# Patient Record
Sex: Female | Born: 1965
Health system: Southern US, Community
[De-identification: ages and names within clinical notes are randomized; demographics above are authoritative.]

## PROBLEM LIST (undated history)

## (undated) VITALS — BP 123/81 | HR 82 | Temp 98.4°F | Resp 16 | Ht 69.0 in | Wt 185.0 lb

## (undated) DIAGNOSIS — Z8042 Family history of malignant neoplasm of prostate: Secondary | ICD-10-CM

## (undated) DIAGNOSIS — H269 Unspecified cataract: Secondary | ICD-10-CM

## (undated) DIAGNOSIS — Z803 Family history of malignant neoplasm of breast: Secondary | ICD-10-CM

## (undated) DIAGNOSIS — F419 Anxiety disorder, unspecified: Secondary | ICD-10-CM

## (undated) DIAGNOSIS — F329 Major depressive disorder, single episode, unspecified: Secondary | ICD-10-CM

## (undated) DIAGNOSIS — Z808 Family history of malignant neoplasm of other organs or systems: Secondary | ICD-10-CM

## (undated) DIAGNOSIS — K2270 Barrett's esophagus without dysplasia: Secondary | ICD-10-CM

## (undated) DIAGNOSIS — C160 Malignant neoplasm of cardia: Secondary | ICD-10-CM

## (undated) DIAGNOSIS — F32A Depression, unspecified: Secondary | ICD-10-CM

## (undated) DIAGNOSIS — Z800 Family history of malignant neoplasm of digestive organs: Secondary | ICD-10-CM

## (undated) HISTORY — PX: OTHER SURGICAL HISTORY: SHX169

## (undated) HISTORY — DX: Family history of malignant neoplasm of prostate: Z80.42

## (undated) HISTORY — PX: IUD REMOVAL: SHX5392

## (undated) HISTORY — DX: Family history of malignant neoplasm of digestive organs: Z80.0

## (undated) HISTORY — DX: Family history of malignant neoplasm of other organs or systems: Z80.8

## (undated) HISTORY — PX: INTRAUTERINE DEVICE INSERTION: SHX323

## (undated) HISTORY — DX: Family history of malignant neoplasm of breast: Z80.3

## (undated) HISTORY — DX: Unspecified cataract: H26.9

---

## 1999-05-20 ENCOUNTER — Other Ambulatory Visit: Admission: RE | Admit: 1999-05-20 | Discharge: 1999-05-20 | Payer: Self-pay | Admitting: Obstetrics and Gynecology

## 2000-08-11 ENCOUNTER — Other Ambulatory Visit: Admission: RE | Admit: 2000-08-11 | Discharge: 2000-08-11 | Payer: Self-pay | Admitting: *Deleted

## 2001-10-18 ENCOUNTER — Other Ambulatory Visit: Admission: RE | Admit: 2001-10-18 | Discharge: 2001-10-18 | Payer: Self-pay | Admitting: Obstetrics and Gynecology

## 2003-02-14 ENCOUNTER — Other Ambulatory Visit: Admission: RE | Admit: 2003-02-14 | Discharge: 2003-02-14 | Payer: Self-pay | Admitting: Obstetrics and Gynecology

## 2011-07-19 ENCOUNTER — Encounter: Payer: Self-pay | Admitting: *Deleted

## 2011-07-19 ENCOUNTER — Emergency Department (HOSPITAL_COMMUNITY)
Admission: EM | Admit: 2011-07-19 | Discharge: 2011-07-19 | Disposition: A | Discharge status: Home / Self Care | Payer: BC Managed Care – PPO | Source: Home / Self Care | Attending: Family Medicine | Admitting: Family Medicine

## 2011-07-19 DIAGNOSIS — A4902 Methicillin resistant Staphylococcus aureus infection, unspecified site: Secondary | ICD-10-CM

## 2011-07-19 DIAGNOSIS — L0291 Cutaneous abscess, unspecified: Secondary | ICD-10-CM

## 2011-07-19 DIAGNOSIS — B9562 Methicillin resistant Staphylococcus aureus infection as the cause of diseases classified elsewhere: Secondary | ICD-10-CM

## 2011-07-19 HISTORY — DX: Anxiety disorder, unspecified: F41.9

## 2011-07-19 HISTORY — DX: Major depressive disorder, single episode, unspecified: F32.9

## 2011-07-19 HISTORY — DX: Depression, unspecified: F32.A

## 2011-07-19 MED ORDER — DOXYCYCLINE HYCLATE 100 MG PO CAPS: 100.0000 mg | ORAL_CAPSULE | Freq: Two times a day (BID) | ORAL | Status: AC

## 2011-07-19 NOTE — ED Provider Notes (Signed)
History     CSN: [id number] Arrival date & time: 07/19/2011  9:37 AM   First MD Initiated Contact with Patient 07/19/11 1013      Chief Complaint  Patient presents with  . Recurrent Skin Infections    (Consider location/radiation/quality/duration/timing/severity/associated sxs/prior treatment) Patient is a 44 y.o. female presenting with rash. The history is provided by the patient.  Rash  This is a recurrent problem. The current episode started 3 to 5 hours ago. The problem has not changed since onset.The problem is associated with an unknown factor. There has been no fever. The rash is present on the right upper leg. The patient is experiencing no pain. She has tried nothing for the symptoms.    Past Medical History  Diagnosis Date  . Anxiety and depression     Past Surgical History  Procedure Date  . Intrauterine device insertion     Paraguard    Family History  Problem Relation Age of Onset  . Cancer Mother   . Barrett's esophagus Mother   . Hypertension Mother     History  Substance Use Topics  . Smoking status: Current Everyday Smoker -- 1.0 packs/day    Types: Cigarettes  . Smokeless tobacco: Not on file  . Alcohol Use: Yes     occasional    OB History    Grav Para Term Preterm Abortions TAB SAB Ect Mult Living                  Review of Systems  Constitutional: Negative.   Skin: Positive for rash. Negative for wound.    Allergies  Review of patient's allergies indicates no known allergies.  Home Medications   Current Outpatient Rx  Name Route Sig Dispense Refill  . ARIPIPRAZOLE 2 MG PO TABS Oral Take 2 mg by mouth daily.      Marland Kitchen CLONAZEPAM 0.5 MG PO TABS Oral Take 0.5 mg by mouth 4 (four) times daily. Taking 2 tabs in the AM, 1 tablet at Lunch, 1 tablet at [employer], 2 tablets before bedtime     . ZOLOFT PO Oral Take 200 mg by mouth daily.        BP 122/84  Pulse 70  Temp(Src) 98.2 F (36.8 C) (Oral)  Resp 18  SpO2 99%  LMP  07/05/2011  Physical Exam  Nursing note and vitals reviewed. Constitutional: She appears well-developed and well-nourished.  Skin: Skin is warm and dry. There is erythema.       Tender nonfluctuant, nondraining erythematous lesion on right medial thigh    ED Course  Procedures (including critical care time)  Labs Reviewed - No data to display No results found.   No diagnosis found.    MDM          Barkley Bruns, MD 07/19/11 1046

## 2011-07-19 NOTE — ED Notes (Signed)
Pt presents with reddened raised area to posterior right thigh that is warm to touch. She has noted raised areas to be recurrent over the past 2-3 months. Denies fever. No open area on wound at this time.

## 2012-02-09 ENCOUNTER — Encounter (HOSPITAL_COMMUNITY): Payer: Self-pay

## 2012-02-09 ENCOUNTER — Inpatient Hospital Stay (HOSPITAL_COMMUNITY)
Admission: RE | Admit: 2012-02-09 | Discharge: 2012-02-13 | DRG: 430 | Disposition: A | Discharge status: Home / Self Care | Payer: BC Managed Care – PPO | Attending: Psychiatry | Admitting: Psychiatry

## 2012-02-09 DIAGNOSIS — IMO0002 Reserved for concepts with insufficient information to code with codable children: Secondary | ICD-10-CM

## 2012-02-09 DIAGNOSIS — T424X5A Adverse effect of benzodiazepines, initial encounter: Secondary | ICD-10-CM | POA: Diagnosis present

## 2012-02-09 DIAGNOSIS — Z23 Encounter for immunization: Secondary | ICD-10-CM

## 2012-02-09 DIAGNOSIS — F17200 Nicotine dependence, unspecified, uncomplicated: Secondary | ICD-10-CM | POA: Diagnosis present

## 2012-02-09 DIAGNOSIS — R45851 Suicidal ideations: Secondary | ICD-10-CM

## 2012-02-09 DIAGNOSIS — F4001 Agoraphobia with panic disorder: Secondary | ICD-10-CM | POA: Diagnosis present

## 2012-02-09 DIAGNOSIS — T424X5S Adverse effect of benzodiazepines, sequela: Secondary | ICD-10-CM | POA: Diagnosis present

## 2012-02-09 DIAGNOSIS — F1210 Cannabis abuse, uncomplicated: Secondary | ICD-10-CM | POA: Diagnosis present

## 2012-02-09 DIAGNOSIS — F3162 Bipolar disorder, current episode mixed, moderate: Principal | ICD-10-CM | POA: Diagnosis present

## 2012-02-09 HISTORY — DX: Anxiety disorder, unspecified: F41.9

## 2012-02-09 HISTORY — DX: Depression, unspecified: F32.A

## 2012-02-09 HISTORY — DX: Major depressive disorder, single episode, unspecified: F32.9

## 2012-02-09 MED ORDER — ACETAMINOPHEN 325 MG PO TABS
650.0000 mg | ORAL_TABLET | Freq: Four times a day (QID) | ORAL | Status: DC | PRN
  Administered 2012-02-11: 650 mg via ORAL

## 2012-02-09 MED ORDER — MAGNESIUM HYDROXIDE 400 MG/5ML PO SUSP: 30.0000 mL | Freq: Every day | ORAL | Status: DC | PRN

## 2012-02-09 MED ORDER — PNEUMOCOCCAL VAC POLYVALENT 25 MCG/0.5ML IJ INJ
0.5000 mL | INJECTION | INTRAMUSCULAR | Status: AC
  Administered 2012-02-10: 0.5 mL via INTRAMUSCULAR

## 2012-02-09 MED ORDER — DIPHENHYDRAMINE HCL 25 MG PO CAPS: 50.0000 mg | ORAL_CAPSULE | Freq: Four times a day (QID) | ORAL | Status: DC | PRN

## 2012-02-09 MED ORDER — ALUM & MAG HYDROXIDE-SIMETH 200-200-20 MG/5ML PO SUSP: 30.0000 mL | ORAL | Status: DC | PRN
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
## 2012-02-09 NOTE — Tx Team (Addendum)
Initial Interdisciplinary Treatment Plan  PATIENT STRENGTHS: (choose at least two) Ability for insight Active sense of humor Average or above average intelligence Capable of independent living Communication skills Financial means General fund of knowledge Motivation for treatment/growth Physical Health Supportive family/friends Work skills  PATIENT STRESSORS: Substance abuse   PROBLEM LIST: Problem List/Patient Goals Date to be addressed Date deferred Reason deferred Estimated date of resolution  Depression 02/09/2012   Discharge  Suicidal Ideation 02/09/2012   Discharge  Benzo Dependence 01/31/12   Discharge                                       DISCHARGE CRITERIA:  Improved stabilization in mood, thinking, and/or behavior Need for constant or close observation no longer present Verbal commitment to aftercare and medication compliance Withdrawal symptoms are absent or subacute and managed without 24-hour nursing intervention  PRELIMINARY DISCHARGE PLAN: Attend 12-step recovery group Outpatient therapy  PATIENT/FAMIILY INVOLVEMENT: This treatment plan has been presented to and reviewed with the patient, Rhonda Newton. Gretta Arab [facility] 02/09/2012, 2:44 PM
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
## 2012-02-10 DIAGNOSIS — F4001 Agoraphobia with panic disorder: Secondary | ICD-10-CM

## 2012-02-10 DIAGNOSIS — F3162 Bipolar disorder, current episode mixed, moderate: Principal | ICD-10-CM

## 2012-02-10 LAB — COMPREHENSIVE METABOLIC PANEL
BUN: 11 mg/dL (ref 6–23)
CO2: 23 mEq/L (ref 19–32)
Calcium: 9.1 mg/dL (ref 8.4–10.5)
Chloride: 103 mEq/L (ref 96–112)
Creatinine, Ser: 0.85 mg/dL (ref 0.50–1.10)
GFR calc non Af Amer: 82 mL/min — ABNORMAL LOW (ref >90)
Total Bilirubin: 0.3 mg/dL (ref 0.3–1.2)

## 2012-02-10 LAB — TSH: TSH: 0.72 u[IU]/mL (ref 0.350–4.500)

## 2012-02-10 MED ORDER — BOOST / RESOURCE BREEZE PO LIQD
1.0000 | Freq: Two times a day (BID) | ORAL | Status: DC
  Filled 2012-02-10 (×9): qty 1

## 2012-02-10 MED ORDER — IBUPROFEN 600 MG PO TABS
600.0000 mg | ORAL_TABLET | Freq: Four times a day (QID) | ORAL | Status: DC | PRN
  Administered 2012-02-10 (×2): 600 mg via ORAL
  Filled 2012-02-10 (×2): qty 1

## 2012-02-10 MED ORDER — CHLORDIAZEPOXIDE HCL 25 MG PO CAPS: 25.0000 mg | ORAL_CAPSULE | ORAL | Status: DC

## 2012-02-10 MED ORDER — THIAMINE HCL 100 MG/ML IJ SOLN
100.0000 mg | Freq: Once | INTRAMUSCULAR | Status: AC
  Administered 2012-02-10: 100 mg via INTRAMUSCULAR

## 2012-02-10 MED ORDER — CARBAMAZEPINE 200 MG PO TABS
200.0000 mg | ORAL_TABLET | Freq: Three times a day (TID) | ORAL | Status: DC
  Administered 2012-02-10 – 2012-02-13 (×11): 200 mg via ORAL
  Filled 2012-02-10 (×6): qty 1
  Filled 2012-02-10: qty 42
  Filled 2012-02-10 (×2): qty 1
  Filled 2012-02-10: qty 42
  Filled 2012-02-10 (×2): qty 1
  Filled 2012-02-10: qty 42
  Filled 2012-02-10 (×2): qty 1

## 2012-02-10 MED ORDER — ENSURE COMPLETE PO LIQD
237.0000 mL | Freq: Every day | ORAL | Status: DC
  Administered 2012-02-11: 237 mL via ORAL

## 2012-02-10 MED ORDER — DIPHENHYDRAMINE HCL 25 MG PO CAPS
50.0000 mg | ORAL_CAPSULE | Freq: Every evening | ORAL | Status: DC | PRN
  Administered 2012-02-12 – 2012-02-13 (×2): 50 mg via ORAL

## 2012-02-10 MED ORDER — HYDROXYZINE HCL 25 MG PO TABS: 25.0000 mg | ORAL_TABLET | Freq: Four times a day (QID) | ORAL | Status: AC | PRN

## 2012-02-10 MED ORDER — VITAMIN B-1 100 MG PO TABS
100.0000 mg | ORAL_TABLET | Freq: Every day | ORAL | Status: DC
  Administered 2012-02-11 – 2012-02-13 (×3): 100 mg via ORAL
  Filled 2012-02-10 (×5): qty 1

## 2012-02-10 MED ORDER — LOPERAMIDE HCL 2 MG PO CAPS: 2.0000 mg | ORAL_CAPSULE | ORAL | Status: AC | PRN

## 2012-02-10 MED ORDER — ADULT MULTIVITAMIN W/MINERALS CH
1.0000 | ORAL_TABLET | Freq: Every day | ORAL | Status: DC
  Administered 2012-02-10 – 2012-02-13 (×4): 1 via ORAL
  Filled 2012-02-10 (×6): qty 1

## 2012-02-10 MED ORDER — ONDANSETRON 4 MG PO TBDP
4.0000 mg | ORAL_TABLET | Freq: Four times a day (QID) | ORAL | Status: AC | PRN
  Administered 2012-02-10 – 2012-02-11 (×3): 4 mg via ORAL
  Filled 2012-02-10: qty 1

## 2012-02-10 MED ORDER — CHLORDIAZEPOXIDE HCL 25 MG PO CAPS: 25.0000 mg | ORAL_CAPSULE | Freq: Every day | ORAL | Status: DC

## 2012-02-10 MED ORDER — CHLORDIAZEPOXIDE HCL 25 MG PO CAPS
25.0000 mg | ORAL_CAPSULE | Freq: Four times a day (QID) | ORAL | Status: DC
  Administered 2012-02-10: 25 mg via ORAL
  Filled 2012-02-10: qty 1

## 2012-02-10 MED ORDER — CHLORDIAZEPOXIDE HCL 25 MG PO CAPS: 25.0000 mg | ORAL_CAPSULE | Freq: Three times a day (TID) | ORAL | Status: DC

## 2012-02-10 MED ORDER — CHLORDIAZEPOXIDE HCL 25 MG PO CAPS
25.0000 mg | ORAL_CAPSULE | Freq: Four times a day (QID) | ORAL | Status: AC | PRN
  Administered 2012-02-10: 25 mg via ORAL
  Filled 2012-02-10: qty 1

## 2012-02-10 NOTE — Progress Notes (Signed)
Patient in bed this AM. Complaints of nausea and trembling related to coming off of the Klonipin. Patient calm and cooperative and eager to progress through detox. Patient started on Librium protocol at lunch time. Able to attend some groups. Denies SI/Hi or psychosis. Patient educated on unit programming and Librium protocol. Joice Lofts RN MS EdS 02/10/2012  1:37 PM

## 2012-02-10 NOTE — Tx Team (Addendum)
Interdisciplinary Treatment Plan Update (Adult)  Date:  02/10/2012  Time Reviewed:  10:37 AM   Progress in Treatment: Attending groups: Yes Participating in groups:  Yes Taking medication as prescribed: Yes Tolerating medication:  Yes Family/Significant other contact made:  Counselor will assess for appropriate contact Patient understands diagnosis:  Yes Discussing patient identified problems/goals with staff:  Yes Medical problems stabilized or resolved:  Yes Denies suicidal/homicidal ideation: Yes Issues/concerns per patient self-inventory:  None identified Other: N/A  New problem(s) identified: None Identified  Reason for Continuation of Hospitalization: Anxiety Depression Medication stabilization Suicidal ideation  Interventions implemented related to continuation of hospitalization: mood stabilization, medication monitoring and adjustment, group therapy and psycho education, safety checks q 15 mins  Additional comments: N/A  Estimated length of stay: 3-5 days  Discharge Plan: Pt will follow up at [facility] for medication management and therapy.    New goal(s): N/A  Review of initial/current patient goals per problem list:    1.  Goal(s): Reduce depressive symptoms  Met:  No  Target date: by discharge  As evidenced by: Reducing depression from a 10 to a 3 as reported by pt.   2.  Goal (s): Reduce/Eliminate suicidal ideation  Met:  No  Target date: by discharge  As evidenced by: pt reporting no SI.    3.  Goal(s): Reduce anxiety symptoms  Met:  No  Target date: by discharge  As evidenced by: Reduce anxiety from a 10 to a 3 as reported by pt.    Attendees: Patient:      Family:     Physician:  Orson Aloe, MD  02/10/2012  10:37 AM   Nursing:   Alease Frame, RN 02/10/2012 10:39 AM   Case Manager:  Reyes Ivan, LCSWA 02/10/2012  10:37 AM   Counselor:  Angus Palms, LCSW 02/10/2012  10:37 AM   Other:  Juline Patch, LCSW 02/10/2012   10:37 AM   Other:  Barrie Folk, RN 02/10/2012  10:37 AM   Other:     Other:      Scribe for Treatment Team:   Carmina Miller, 02/10/2012 , 10:37 AM

## 2012-02-10 NOTE — Progress Notes (Signed)
Pt attended discharge planning group and actively participated.  Pt presents with flat affect and depressed mood.  Pt was open with sharing reason for entering the hospital.  Pt states that she has been depressed and started having suicidal thoughts, but did not act on them.  Pt states that she has felt this way for the past 2 weeks.  Pt states that she has been going to [facility] for medication management and therapy for the past 9 months.  Pt states that the meds were helping at first but stopped working so they took her off of them.  Pt states that she lives with her 73 year old son in [place] and has access to transportation.  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  SW will refer pt back to [facility] for medication management and therapy.  No further needs voiced by pt at this time.  Safety planning and suicide prevention discussed.     Reyes Ivan, LCSWA 02/10/2012  10:35 AM

## 2012-02-10 NOTE — Progress Notes (Signed)
Patient reports sleeping well last night, appetite good, energy level low and ability to pay attention good. Rates depression as 7/10 and hopelessness as 9/10. Patient endorses tremors and diarrhea which she relates to substance withdrawal. Patient also endorses passes SI. She contracts for safety and agreed to attend groups today for support from staff and peers. PRN will be offered as needed PRN. Patient also would like to focus on her nutritions while at [facility]. She states that she had not been eating well prior to her admission and feels that this would benefit her recovery. Joice Lofts RN MS EdS 02/10/2012  9:19 AM
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
## 2012-02-10 NOTE — Progress Notes (Signed)
Patient ID: Rhonda Newton, female   DOB: March 01, 1966, 46 y.o.   MRN: [id number] Patient cooperative but depressed.  Pt. Gets teary eyed at times when talking about why she is here. Stating she should be happy at this time but she is not. Pt. Rates depression 9/10 but states anxiety is "low".  Pt has had no complaints and she attended the wrap up group although not very interactive with in the milieu.  Denies SI/HI. Staff to monitor Q17mins for safety.
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
## 2012-02-10 NOTE — Progress Notes (Signed)
INITIAL ADULT NUTRITION ASSESSMENT Date: 02/10/2012   Time: 6:24 PM Reason for Assessment: Nutrition Risk-weight loss  ASSESSMENT: Female 46 y.o.  Dx: Bipolar affective disorder, mixed, moderate degree  Hx:  Past Medical History  Diagnosis Date  . Anxiety and depression   . Anxiety   . Depression    ' Related Meds:  No current facility-administered medications on file prior to encounter.   Current Outpatient Prescriptions on File Prior to Encounter  Medication Sig Dispense Refill  . diphenhydrAMINE (BENADRYL) 25 mg capsule Take 50 mg by mouth every 6 (six) hours as needed. For allergies.      Marland Kitchen lithium carbonate 300 MG capsule Take 600 mg by mouth at bedtime.       . propranolol (INDERAL) 10 MG tablet Take 10 mg by mouth 3 (three) times daily.      . sertraline (ZOLOFT) 100 MG tablet Take 200 mg by mouth at bedtime.      . clonazePAM (KLONOPIN) 0.5 MG tablet Take 0.5-1 mg by mouth See admin instructions. She takes two tablets in the morning, one tablet at lunch (11am), one tablet in the afternoon (4pm) and two tablets before bedtime.         Ht: 5\' 9"  (175.3 cm)  Wt: 185 lb (83.915 kg)  Ideal Wt: 66.2 kg  % Ideal Wt: 127  Usual Wt: 162-192#   Body mass index is 27.32 kg/(m^2).  Food/Nutrition Related Hx: Pt reports recent weight loss secondary to no appetite.  Now with Nausea.  Drank a [employer] shake prior to admit when unable to eat.  Labs:  CMP     Component Value Date/Time   NA 137 02/10/2012 0619   K 3.7 02/10/2012 0619   CL 103 02/10/2012 0619   CO2 23 02/10/2012 0619   GLUCOSE 101* 02/10/2012 0619   BUN 11 02/10/2012 0619   CREATININE 0.85 02/10/2012 0619   CALCIUM 9.1 02/10/2012 0619   PROT 7.1 02/10/2012 0619   ALBUMIN 3.7 02/10/2012 0619   AST 15 02/10/2012 0619   ALT 18 02/10/2012 0619   ALKPHOS 62 02/10/2012 0619   BILITOT 0.3 02/10/2012 0619   GFRNONAA 82* 02/10/2012 0619   GFRAA >90 02/10/2012 0619    Diet Order: General  Supplements/Tube  Feeding:none  IVF:    Estimated Nutritional Needs:   Kcal: 1800-1900 Protein: 75-85g Fluid: >1.8L  Poor intake for 2 months.  Decreased intake in hospital.  Willing to try supplements.  NUTRITION DIAGNOSIS: -Inadequate oral intake (NI-2.1).  Status: Ongoing  RELATED TO: depression, meds and med withdrawal, nausea  AS EVIDENCE BY: reported poor po and weight loss  MONITORING/EVALUATION(Goals): Goal:  Maximize nutrition during hospital stay  EDUCATION NEEDS: -No education needs identified at this time  INTERVENTION: Pt intake is poor.  Willing to try supplements. Ensure Complete 1 daily, Resource Fruit Beverage 2 daily Snacks from unit.  Dietitian [phone]  DOCUMENTATION CODES Per approved criteria  -Not Applicable    Jeoffrey Massed 02/10/2012, 6:24 PM

## 2012-02-10 NOTE — Progress Notes (Addendum)
[facility] Group Notes:  (Counselor/Nursing/MHT/Case Management/Adjunct)  02/10/2012 11:00AM Music As A Relapse Prevention Tool   Type of Therapy:  Group Therapy  Participation Level:  Limited  Participation Quality:  Attentive  Affect: Blunted  Cognitive:  Appropriate  Insight:  None  Engagement in Group:  Limited  Engagement in Therapy:  Minimal  Modes of Intervention:  Support and Exploration  Summary of Progress/Problems: Various songs were played that used different styles and perspectives to express empowerment over a difficult situation. Rhonda [facility] seemed attentive and engaged, but did not personally share during group.   Billie Lade 02/10/2012  2:02 PM

## 2012-02-10 NOTE — H&P (Signed)
Medical/psychiatric screening examination/treatment/procedure(s) were performed by non-physician practitioner and as supervising physician I was immediately available for consultation/collaboration.  I have seen and examined this patient and agree the major elements of this evaluation.  
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
## 2012-02-10 NOTE — Progress Notes (Signed)
[facility] MD Progress Note  02/10/2012 2:33 PM  Diagnosis:  Axis I: Bipolar, mixed and Panic Disorder  ADL's:  Intact  Sleep: Poor  Appetite:  Poor  Suicidal Ideation:  Pt denies any suicidal thoughts now, but has had them on and off during the day today. Homicidal Ideation:  Denies adamantly any homicidal thoughts.  Mental Status Examination/Evaluation: Objective:  Appearance: Casual  Eye Contact::  Good  Speech:  Clear and Coherent  Volume:  Normal  Mood:  Anxious, Depressed, Hopeless and Irritable  Affect:  Congruent  Thought Process:  Coherent  Orientation:  Full  Thought Content:  WDL  Suicidal Thoughts:  Yes.  without intent/plan  Homicidal Thoughts:  No  Memory:  Immediate;   Fair  Judgement:  Impaired  Insight:  Fair  Psychomotor Activity:  Normal  Concentration:  Fair  Recall:  Fair  Akathisia:  No  Handed:  Right  AIMS (if indicated):     Assets:  Communication Skills Desire for Improvement  Sleep:  Number of Hours: 5.75    Vital Signs:Blood pressure 122/75, pulse 80, temperature 98.4 F (36.9 C), temperature source Oral, resp. rate 18, height 5\' 9"  (1.753 m), weight 83.915 kg (185 lb), last menstrual period 02/02/2012. Current Medications: Current Facility-Administered Medications  Medication Dose Route Frequency Provider Last Rate Last Dose  . acetaminophen (TYLENOL) tablet 650 mg  650 mg Oral Q6H PRN Larena Sox, MD      . alum & mag hydroxide-simeth (MAALOX/MYLANTA) 200-200-20 MG/5ML suspension 30 mL  30 mL Oral Q4H PRN Larena Sox, MD      . carbamazepine (TEGRETOL) tablet 200 mg  200 mg Oral TID Mike Craze, MD      . chlordiazePOXIDE (LIBRIUM) capsule 25 mg  25 mg Oral Q6H PRN Mike Craze, MD      . diphenhydrAMINE (BENADRYL) capsule 50 mg  50 mg Oral QHS PRN Larena Sox, MD      . hydrOXYzine (ATARAX/VISTARIL) tablet 25 mg  25 mg Oral Q6H PRN Mike Craze, MD      . loperamide (IMODIUM) capsule 2-4 mg  2-4 mg Oral PRN Mike Craze, MD      . magnesium hydroxide (MILK OF MAGNESIA) suspension 30 mL  30 mL Oral Daily PRN Larena Sox, MD      . mulitivitamin with minerals tablet 1 tablet  1 tablet Oral Daily Mike Craze, MD   1 tablet at 02/10/12 1155  . ondansetron (ZOFRAN-ODT) disintegrating tablet 4 mg  4 mg Oral Q6H PRN Mike Craze, MD   4 mg at 02/10/12 1202  . pneumococcal 23 valent vaccine (PNU-IMMUNE) injection 0.5 mL  0.5 mL Intramuscular Tomorrow-1000 Mike Craze, MD   0.5 mL at 02/10/12 1247  . thiamine (B-1) injection 100 mg  100 mg Intramuscular Once Mike Craze, MD   100 mg at 02/10/12 1155  . thiamine (VITAMIN B-1) tablet 100 mg  100 mg Oral Daily Mike Craze, MD      . DISCONTD: chlordiazePOXIDE (LIBRIUM) capsule 25 mg  25 mg Oral QID Mike Craze, MD   25 mg at 02/10/12 1154  . DISCONTD: chlordiazePOXIDE (LIBRIUM) capsule 25 mg  25 mg Oral TID Mike Craze, MD      . DISCONTD: chlordiazePOXIDE (LIBRIUM) capsule 25 mg  25 mg Oral BH-qamhs Mike Craze, MD      . DISCONTD: chlordiazePOXIDE (LIBRIUM) capsule 25 mg  25 mg Oral Daily  Mike Craze, MD      . DISCONTD: diphenhydrAMINE (BENADRYL) capsule 50 mg  50 mg Oral Q6H PRN Larena Sox, MD        Lab Results:  Results for orders placed during the hospital encounter of 02/09/12 (from the past 48 hour(s))  TSH     Status: Normal   Collection Time   02/10/12  6:19 AM      Component Value Range Comment   TSH 0.720  0.350 - 4.500 (uIU/mL)   LITHIUM LEVEL     Status: Abnormal   Collection Time   02/10/12  6:19 AM      Component Value Range Comment   Lithium Lvl 0.27 (*) 0.80 - 1.40 (mEq/L)   COMPREHENSIVE METABOLIC PANEL     Status: Abnormal   Collection Time   02/10/12  6:19 AM      Component Value Range Comment   Sodium 137  135 - 145 (mEq/L)    Potassium 3.7  3.5 - 5.1 (mEq/L)    Chloride 103  96 - 112 (mEq/L)    CO2 23  19 - 32 (mEq/L)    Glucose, Bld 101 (*) 70 - 99 (mg/dL)    BUN 11  6 - 23 (mg/dL)     Creatinine, Ser 1.61  0.50 - 1.10 (mg/dL)    Calcium 9.1  8.4 - 10.5 (mg/dL)    Total Protein 7.1  6.0 - 8.3 (g/dL)    Albumin 3.7  3.5 - 5.2 (g/dL)    AST 15  0 - 37 (U/L)    ALT 18  0 - 35 (U/L)    Alkaline Phosphatase 62  39 - 117 (U/L)    Total Bilirubin 0.3  0.3 - 1.2 (mg/dL)    GFR calc non Af Amer 82 (*) >90 (mL/min)    GFR calc Af Amer >90  >90 (mL/min)     Physical Findings: AIMS:  , ,  ,  ,    CIWA:    COWS:     Treatment Plan Summary: Daily contact with patient to assess and evaluate symptoms and progress in treatment Medication management Mood/anxiety less than 3/10 where the scale is 1 is the best and 10 is the worst  Plan: Admit, stop the Librium protocol and shift to Tegretol for anxiety, seizure, and mood control. Motrin for headaches. Consider caffeine withdrawal as the cause of her headaches. Discussed options of adding Thorazine or Neurontin for the anxiety or adding back the Inderal that she has taken for her anxiety before. Discussed the risks, benefits, and probable clinical course with and without treatment.  Pt is agreeable to the current course of treatment.  Rhonda [facility] 02/10/2012, 2:33 PM

## 2012-02-11 LAB — GLUCOSE, CAPILLARY: Glucose-Capillary: 126 mg/dL — ABNORMAL HIGH (ref 70–99)

## 2012-02-11 MED ORDER — LORAZEPAM 2 MG/ML IJ SOLN: 2.0000 mg | Freq: Four times a day (QID) | INTRAMUSCULAR | Status: DC | PRN

## 2012-02-11 MED ORDER — LORAZEPAM 1 MG PO TABS
2.0000 mg | ORAL_TABLET | Freq: Four times a day (QID) | ORAL | Status: DC | PRN
  Administered 2012-02-11 – 2012-02-12 (×2): 2 mg via ORAL
  Filled 2012-02-11: qty 2

## 2012-02-11 MED ORDER — LORAZEPAM 1 MG PO TABS
ORAL_TABLET | ORAL | Status: AC
  Filled 2012-02-11: qty 2

## 2012-02-11 NOTE — Progress Notes (Signed)
  Rhonda [facility] is a 46 y.o. female [id number] Jun 18, 1966  02/09/2012 Principal Problem:  *Bipolar affective disorder, mixed, moderate degree Active Problems:  Panic disorder with agoraphobia, moderate agoraphobic avoidance and moderate panic attacks   Mental Status:Mood is tied to withdrawal symptoms.SI is decreasing.denies AVH or HI.    Subjective/Objective: Very tremulous can hardly walk and is very nauseated from benzoe withdrawal. Was hoping to be told it would pass  In just a few more hours.Li 0.27   Filed Vitals:   02/11/12 1305  BP: 122/85  Pulse: 75  Temp:   Resp:     Lab Results:   BMET    Component Value Date/Time   NA 137 02/10/2012 0619   K 3.7 02/10/2012 0619   CL 103 02/10/2012 0619   CO2 23 02/10/2012 0619   GLUCOSE 101* 02/10/2012 0619   BUN 11 02/10/2012 0619   CREATININE 0.85 02/10/2012 0619   CALCIUM 9.1 02/10/2012 0619   GFRNONAA 82* 02/10/2012 0619   GFRAA >90 02/10/2012 0619   Li low at 0.27  Medications:  Scheduled:     . carbamazepine  200 mg Oral TID  . feeding supplement  237 mL Oral Daily  . feeding supplement  1 Container Oral BID BM  . mulitivitamin with minerals  1 tablet Oral Daily  . thiamine  100 mg Oral Daily  . DISCONTD: chlordiazePOXIDE  25 mg Oral QID  . DISCONTD: chlordiazePOXIDE  25 mg Oral TID  . DISCONTD: chlordiazePOXIDE  25 mg Oral BH-qamhs  . DISCONTD: chlordiazePOXIDE  25 mg Oral Daily     PRN Meds acetaminophen, alum & mag hydroxide-simeth, chlordiazePOXIDE, diphenhydrAMINE, hydrOXYzine, ibuprofen, loperamide, magnesium hydroxide, ondansetron  Plan check tegretol level otherwise continue supportive care. Patient is trying to not take any prn benzoe but is more symptomatic than she anticipated. Taysha Majewski,MICKIE D. 02/11/2012

## 2012-02-11 NOTE — Progress Notes (Signed)
Pt has been attending the program and interacting with select peers. States that in the last 24 hours has had thoughts of SI. Denies HI. Having a difficult time. Legs are weak and Pt is leaning on staff and the wall to steady her. Was  Feeling nauseated this afternoon and had to lie back looking at the ceiling to help her. Also was given Zofran and 2 mg of ativan by mouth. Pt rested in her bed and then was able to stand and take a shower. Has attended all but one group. Given support, reassurance and praise.

## 2012-02-11 NOTE — Progress Notes (Signed)
[facility] Group Notes:  (Counselor/Nursing/MHT/Case Management/Adjunct)  02/11/2012 6:41 PM  Type of Therapy:  Psychoeducational Skills  Participation Level:  Did Not Attend    Wandra Scot 02/11/2012, 6:41 PM
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
## 2012-02-12 LAB — CARBAMAZEPINE LEVEL, TOTAL: Carbamazepine Lvl: 7.7 ug/mL (ref 4.0–12.0)

## 2012-02-12 NOTE — Progress Notes (Signed)
[facility] Adult Inpatient Family/Significant Other Suicide Prevention Education  Suicide Prevention Education:  Education Completed; Purnell Shoemaker [phone] or [phone](cell)-Pt.'s mother- has been identified by the patient as the family member/significant other with whom the patient will be residing, and identified as the person(s) who will aid the patient in the event of a mental health crisis (suicidal ideations/suicide attempt).  With written consent from the patient, the family member/significant other has been provided the following suicide prevention education, prior to the and/or following the discharge of the patient.  The suicide prevention education provided includes the following:  Suicide risk factors  Suicide prevention and interventions  National Suicide Hotline telephone number  [facility] assessment telephone number  [facility] Emergency Assistance 911  [facility] and/or Residential Mobile Crisis Unit telephone number  Request made of family/significant other to:  Remove weapons (e.g., guns, rifles, knives), all items previously/currently identified as safety concern. Pt.'s mother states the pt. Does not own any guns or weapons.    Remove drugs/medications (over-the-counter, prescriptions, illicit drugs), all items previously/currently identified as a safety concern. Pt.'s mother does not think the pt. has anything but will secure and check out the pt.'s home before the pt. Is discharged.  Pt.'s mother reports that the pt. has had no prior SI attempts and that her concerns are about the pt. communicating what is going on with her. Pt.'s mother was told about pt.'s follow up  Counseling with [facility]. Pt.'s mother state she is supportive of the pt. But needs the pt. Communicate what is going on with her.  The family member/significant other verbalizes understanding of the suicide prevention education information provided.  The family  member/significant other agrees to remove the items of safety concern listed above.  Neila Gear 02/12/2012, 11:36 AM

## 2012-02-12 NOTE — Progress Notes (Signed)
Patient was taken to her locker #21  and removed $20 and her debit card from her purse to be given to her son waiting in the lobby. Security officer Clayburn Pert witnessed this transaction.

## 2012-02-12 NOTE — Progress Notes (Signed)
Pt is doing much better today. Up walking around without help, steady on her feet. Affect and mood are bright. Pt rates her depression at a 3 and her hopelessness at a 1. Participates in groups, and interacts with her peers appropriately. Denies SI and HI. States that she is feeling 100% better than yesterday. Given support, reassurance and praise.

## 2012-02-12 NOTE — Progress Notes (Signed)
Writer unable to complete ciwa because patient was asleep and went to bed early

## 2012-02-12 NOTE — Progress Notes (Signed)
Patient came and received her 2000 medication, she reported having had a good day and enjoyed being able to go outside and get some walking in. Patient reporting feeling better today than yesterday and did not feel as unsteady on her feet today. Patient requested medication for anxiety which she received. Patient denies pain,- si/hi auditory/visual hall. Safety maintained, will continue to monitor.

## 2012-02-12 NOTE — Progress Notes (Signed)
  Rhonda Newton is a 46 y.o. female [id number] Jul 27, 1966  02/09/2012 Principal Problem:  *Bipolar affective disorder, mixed, moderate degree Active Problems:  Panic disorder with agoraphobia, moderate agoraphobic avoidance and moderate panic attacks   Mental Status: Mood almost unbelievable improvement from yesterday. Denies SI/HI/AVH today.     Subjective/Objective:  Hd one prn dose of Ativan yesterday and it did the trick.Is able to walk today affect much brighter.    Filed Vitals:   02/12/12 1206  BP: 136/79  Pulse: 80  Temp:   Resp:     Lab Results:  Tegretol is therapeutic at 7.7  BMET    Component Value Date/Time   NA 137 02/10/2012 0619   K 3.7 02/10/2012 0619   CL 103 02/10/2012 0619   CO2 23 02/10/2012 0619   GLUCOSE 101* 02/10/2012 0619   BUN 11 02/10/2012 0619   CREATININE 0.85 02/10/2012 0619   CALCIUM 9.1 02/10/2012 0619   GFRNONAA 82* 02/10/2012 0619   GFRAA >90 02/10/2012 0619    Medications:  Scheduled:     . carbamazepine  200 mg Oral TID  . feeding supplement  237 mL Oral Daily  . feeding supplement  1 Container Oral BID BM  . mulitivitamin with minerals  1 tablet Oral Daily  . thiamine  100 mg Oral Daily     PRN Meds acetaminophen, alum & mag hydroxide-simeth, chlordiazePOXIDE, diphenhydrAMINE, hydrOXYzine, ibuprofen, loperamide, LORazepam, LORazepam, magnesium hydroxide, ondansetron  Plan: continue with current plan of care. Oria Klimas,MICKIE D. 02/12/2012

## 2012-02-12 NOTE — Progress Notes (Deleted)
Patient was asleep, eyes closed, resp. even, unable to complete ciwa

## 2012-02-12 NOTE — Progress Notes (Signed)
[facility] Adult Inpatient Family/Significant Other Suicide Prevention Education  Suicide Prevention Education:  Contact Attempts: Delorse Limber- has been identified by the patient as the family member/significant other with whom the patient will be residing, and identified as the person(s) who will aid the patient in the event of a mental health crisis.  With written consent from the patient, two attempts were made to provide suicide prevention education, prior to and/or following the patient's discharge.  We were unsuccessful in providing suicide prevention education.  A suicide education pamphlet was given to the patient to share with family/significant other.  Date and time of first attempt: by Lamar Blinks on 02/12/12 at 8:34 am. Date and time of second attempt:  Neila Gear 02/12/2012, 8:32 AM

## 2012-02-12 NOTE — Progress Notes (Signed)
[facility] Group Notes:  (Counselor/Nursing/MHT/Case Management/Adjunct)  02/12/2012 5:37 PM  Type of Therapy:  After Care Planning Group  Pt. participated din after care planning group and was given [facility] SI Pamphlet and crisis and hot line numbers. Pt. Agreed to use them if needed. Patients in the group were also given information about support groups in [employer].  And information about the [employer].  Pt. Stated she was doing good and that she saw the PA and no changes were done to her medications. Pt. Denies any SI or HI.  Neila Gear 02/12/2012, 5:37 PM

## 2012-02-12 NOTE — Progress Notes (Signed)
Patient came to medication window to inquire if any medications were due. Patient was given scheduled 2000 med. Patient reported that she was feeling better after receiving a prn of ativan earlier. Patient reports that she will not need any other medications for the night. Support and encouragement offered. Patient currently denies having pain, -si/hi/aud/visual hall. Safety maintained , will continue to monitor.

## 2012-02-13 DIAGNOSIS — T50995A Adverse effect of other drugs, medicaments and biological substances, initial encounter: Secondary | ICD-10-CM

## 2012-02-13 DIAGNOSIS — T424X5A Adverse effect of benzodiazepines, initial encounter: Secondary | ICD-10-CM

## 2012-02-13 DIAGNOSIS — T424X5S Adverse effect of benzodiazepines, sequela: Secondary | ICD-10-CM | POA: Diagnosis present

## 2012-02-13 MED ORDER — CARBAMAZEPINE 200 MG PO TABS: 200.0000 mg | ORAL_TABLET | Freq: Three times a day (TID) | ORAL | Status: DC

## 2012-02-13 MED ORDER — IBUPROFEN 200 MG PO TABS: 600.0000 mg | ORAL_TABLET | Freq: Three times a day (TID) | ORAL | Status: DC | PRN

## 2012-02-13 MED ORDER — CHLORDIAZEPOXIDE HCL 25 MG PO CAPS: 25.0000 mg | ORAL_CAPSULE | Freq: Four times a day (QID) | ORAL | Status: DC | PRN

## 2012-02-13 MED ORDER — DIPHENHYDRAMINE HCL 25 MG PO CAPS: 50.0000 mg | ORAL_CAPSULE | Freq: Four times a day (QID) | ORAL | Status: DC | PRN

## 2012-02-13 NOTE — Progress Notes (Signed)
[facility] MD Progress Note  02/13/2012 1:32 PM  Diagnosis:  Axis I: Bipolar, mixed, Panic Disorder and Benzodiazepine causing adverse reaction in therapeutic use.  ADL's:  Intact  Sleep: Good  Appetite:  Good  Suicidal Ideation:  Pt denies any suicidal thoughts now, but has had them on and off during the day today. Homicidal Ideation:  Denies adamantly any homicidal thoughts.  Mental Status Examination/Evaluation: Objective:  Appearance: Casual  Eye Contact::  Good  Speech:  Clear and Coherent  Volume:  Normal  Mood:  Euthymic  Affect:  Congruent  Thought Process:  Coherent  Orientation:  Full  Thought Content:  WDL  Suicidal Thoughts:  No  Homicidal Thoughts:  No  Memory:  Immediate;   Good  Judgement:  Good  Insight:  Good  Psychomotor Activity:  Normal  Concentration:  Good  Recall:  Good  Akathisia:  No  Handed:  Right  AIMS (if indicated):     Assets:  Communication Skills Desire for Improvement  Sleep:  Number of Hours: 5.25    Vital Signs:Blood pressure 123/81, pulse 82, temperature 98.4 F (36.9 C), temperature source Oral, resp. rate 16, height 5\' 9"  (1.753 m), weight 83.915 kg (185 lb), last menstrual period 02/02/2012. Current Medications: Current Facility-Administered Medications  Medication Dose Route Frequency Provider Last Rate Last Dose  . acetaminophen (TYLENOL) tablet 650 mg  650 mg Oral Q6H PRN Larena Sox, MD   650 mg at 02/11/12 1618  . alum & mag hydroxide-simeth (MAALOX/MYLANTA) 200-200-20 MG/5ML suspension 30 mL  30 mL Oral Q4H PRN Larena Sox, MD      . carbamazepine (TEGRETOL) tablet 200 mg  200 mg Oral TID Mike Craze, MD   200 mg at 02/13/12 0804  . chlordiazePOXIDE (LIBRIUM) capsule 25 mg  25 mg Oral Q6H PRN Mike Craze, MD   25 mg at 02/10/12 2205  . diphenhydrAMINE (BENADRYL) capsule 50 mg  50 mg Oral QHS PRN Larena Sox, MD   50 mg at 02/13/12 0053  . feeding supplement (ENSURE COMPLETE) liquid 237 mL  237 mL Oral Daily  Anastasia Fiedler Jobe, RD   237 mL at 02/11/12 0900  . feeding supplement (RESOURCE BREEZE) liquid 1 Container  1 Container Oral BID BM Jeoffrey Massed, RD      . hydrOXYzine (ATARAX/VISTARIL) tablet 25 mg  25 mg Oral Q6H PRN Mike Craze, MD      . ibuprofen (ADVIL,MOTRIN) tablet 600 mg  600 mg Oral Q6H PRN Mike Craze, MD   600 mg at 02/10/12 2239  . loperamide (IMODIUM) capsule 2-4 mg  2-4 mg Oral PRN Mike Craze, MD      . magnesium hydroxide (MILK OF MAGNESIA) suspension 30 mL  30 mL Oral Daily PRN Larena Sox, MD      . mulitivitamin with minerals tablet 1 tablet  1 tablet Oral Daily Mike Craze, MD   1 tablet at 02/13/12 0804  . ondansetron (ZOFRAN-ODT) disintegrating tablet 4 mg  4 mg Oral Q6H PRN Mike Craze, MD   4 mg at 02/11/12 1416  . thiamine (VITAMIN B-1) tablet 100 mg  100 mg Oral Daily Mike Craze, MD   100 mg at 02/13/12 0803  . DISCONTD: LORazepam (ATIVAN) injection 2 mg  2 mg Intramuscular Q6H PRN Mickie D. Adams, PA      . DISCONTD: LORazepam (ATIVAN) tablet 2 mg  2 mg Oral Q6H PRN Mickie D. Pernell Dupre, [place]  2 mg at 02/12/12 1944    Lab Results:  Results for orders placed during the hospital encounter of 02/09/12 (from the past 48 hour(s))  GLUCOSE, CAPILLARY     Status: Abnormal   Collection Time   02/11/12  8:43 PM      Component Value Range Comment   Glucose-Capillary 126 (*) 70 - 99 (mg/dL)    Comment 1 Notify RN     CARBAMAZEPINE LEVEL, TOTAL     Status: Normal   Collection Time   02/12/12  7:02 AM      Component Value Range Comment   Carbamazepine Lvl 7.7  4.0 - 12.0 (ug/mL)     Physical Findings: AIMS:  , ,  ,  ,    CIWA:  CIWA-Ar Total: 1  COWS:     Treatment Plan Summary: Daily contact with patient to assess and evaluate symptoms and progress in treatment Medication management Mood/anxiety less than 3/10 where the scale is 1 is the best and 10 is the worst  Plan: All the medication changes worked well for her.  She is ready for discharge  today.  Wallis Spizzirri 02/13/2012, 1:32 PM

## 2012-02-13 NOTE — [facility] Counselor (Addendum)
Group Therapy Note  Date:  02/13/2012 Time:  11:00am  Group Topic/Focus:  Overcoming Obstacles to wellness  Participation Level:  None  Participation Quality:  Attentive and did not speak--joined the group late  Affect:  Appropriate  Cognitive:  Appropriate  Insight:  Limited  Engagement in Group:  None  Additional Comments:  Rhonda Newton joined the group about half-way through. She was attentive and listening but did not make any comments.   Christy Sartorius. 02/13/2012, 12:18 PM     [facility] Group Notes: (Counselor/Nursing/MHT/Case Management/Adjunct) 02/13/2012   @1 :15pm Mindfulness Meditation for Self-Acceptance  Type of Therapy:  Group Therapy  Participation Level:  Active  Participation Quality: Appropriate, Sharing  Affect:  Appropriate  Cognitive:  Appropriate  Insight:  Good  Engagement in Group: Good  Engagement in Therapy:  Good  Modes of Intervention:  Support and Exploration  Summary of Progress/Problems: Rhonda Newton was very attentive to discussion of mindfulness and grounding skills. She participated in mindfulness meditation and processed her experience afterward. Rhonda Newton found the music and sounds very relaxing, though she had trouble concentrating on the guidance. She explored ways that she could practice mindfulness in order to be more relaxed and work toward non-judgmental stance.  Billie Lade 02/13/2012 2:48 PM

## 2012-02-13 NOTE — Tx Team (Signed)
Interdisciplinary Treatment Plan Update (Adult)  Date:  02/13/2012  Time Reviewed:  10:51 AM   Progress in Treatment: Attending groups: Yes Participating in groups:  Yes Taking medication as prescribed: Yes Tolerating medication:  Yes Family/Significant other contact made:  Yes Patient understands diagnosis:  Yes Discussing patient identified problems/goals with staff:  Yes Medical problems stabilized or resolved:  Yes Denies suicidal/homicidal ideation: Yes Issues/concerns per patient self-inventory:  None identified Other: N/A  New problem(s) identified: None Identified  Reason for Continuation of Hospitalization: Stable to d/c  Interventions implemented related to continuation of hospitalization: Stable to d/c  Additional comments: N/A  Estimated length of stay: D/C today  Discharge Plan: Pt will follow up at [facility] for medication management and therapy  New goal(s): N/A  Review of initial/current patient goals per problem list:    1.  Goal(s): Reduce depressive symptoms  Met:  Yes  Target date: by discharge  As evidenced by: Reducing depression from a 10 to a 3 as reported by pt. Pt ranks at a 2 today.   2.  Goal (s): Reduce/Eliminate suicidal ideation  Met:  Yes  Target date: by discharge  As evidenced by: pt reporting no SI.    3.  Goal(s): Reduce anxiety symptoms  Met:  Yes  Target date: by discharge  As evidenced by: Reduce anxiety from a 10 to a 3 as reported by pt. Pt ranks at a 1 today.    Attendees: Patient:  Rhonda Newton 02/13/2012 10:52 AM   Family:     Physician:  Orson Aloe, MD  02/13/2012  10:51 AM   Nursing:   Alease Frame, RN 02/13/2012 10:52 AM  Case Manager:  Reyes Ivan, LCSWA 02/13/2012  10:51 AM   Counselor:  Angus Palms, LCSW 02/13/2012  10:51 AM   Other:  Juline Patch, LCSW 02/13/2012  10:51 AM   Other: Corinne Ports, Psyc intern 02/13/2012  10:51 AM   Other:     Other:      Scribe for Treatment Team:   Carmina Miller, 02/13/2012 , 10:51 AM

## 2012-02-13 NOTE — Progress Notes (Signed)
Patient discharged from [facility] today w/script, sample of med, instructions and followup. Denies Si or Hi, ate meals in the DR, taking meds as ordered by MD, leaving /wsister/mother. No questions or concerns at this time.

## 2012-02-13 NOTE — Progress Notes (Signed)
[facility] Case Management Discharge Plan:  Will you be returning to the same living situation after discharge: Yes,  return own home At discharge, do you have transportation home?:Yes,  access to transportation Do you have the ability to pay for your medications:Yes,  access to meds  Release of information consent forms completed and in the chart;  Patient's signature needed at discharge.  Patient to Follow up at:  Follow-up Information    Follow up with [facility] on 02/20/2012. (Appointment scheduled at 11:00 am with Dr. Idamae Schuller for therapy)    Contact information:   [address]  [place], [place] [address] [phone]      Follow up with [facility] on 02/24/2012. (Appointment scheduled at 4:40 pm with Anne Fu, PA)    Contact information:   [address]  [place], [place] [address] [phone]         Patient denies SI/HI:   Yes,  denies SI/HI    Safety Planning and Suicide Prevention discussed:  Yes,  discussed with pt today  Barrier to discharge identified:No.  Summary and Recommendations: Pt attended discharge planning group and actively participated.  Pt presents with bright mood and affect.  Pt ranks depression at a 2 and anxiety at a 1 today.  Pt reports feeling stable to d/c today.  Pt discussed feeling stable and 100% better on new meds.  No recommendations from SW.  No further needs voiced by pt.  Pt stable to discharge.     Carmina Miller 02/13/2012, 11:01 AM

## 2012-02-13 NOTE — Discharge Summary (Signed)
Physician Discharge Summary Note  Patient:  Rhonda Newton is an 46 y.o., female MRN:  [id number] DOB:  11-Oct-1965 Patient phone:  [phone] (home)  Patient address:   [address] [place] [address],   Date of Admission:  02/09/2012 Date of Discharge: 02/13/12  Reason for Admission: Suicidal ideations with plans to cut wrists.  Discharge Diagnoses: Principal Problem:  *Benzodiazepine causing adverse effect in therapeutic use, sequela Active Problems:  Bipolar affective disorder, mixed, moderate degree  Panic disorder with agoraphobia, moderate agoraphobic avoidance and moderate panic attacks   Axis Diagnosis:   AXIS I:  panic disorder, with agoraphobia, moderate agorapohobic avoidance AXIS II:  Deferred AXIS III:   Past Medical History  Diagnosis Date  . Anxiety and depression   . Anxiety   . Depression    AXIS IV:  other psychosocial or environmental problems AXIS V:  68  Level of Care:  OP  Hospital Course: This is a 46 year old Caucasian female, admitted to [facility] as a walk-in with complaints of suicidal ideations ans plans to cut wrists. Patient reports, "My boss brought me in here for help.I was having suicidal thoughts. The reason for this, I can't tell you. I should be happy right now, but I don't feel it. I recently received an inheritance from my uncle that worth over $100,000.00. I am financially set, at least for now. My 98 year old son who has been living with me is about to move out on his own. I should be happy for all of these, but I'm not. I have been having severe anxiety and depression x 9 months. I go to [facility] for my psychiatric visits and medication management. I am on Lithium Carbonate, Sertraline, Abilify and propranolol for my depression. But these medications are not helping me. I get panic attacks all the time. It wakes me up at night, and I will be surrounded with fear, terror and panic attacks. I get mood swings, my mind is running constantly,  no breaks. I can go for 3 days without any sleep, still will remain functional. Then when I crash, it will be mood swings, racing thoughts, fatigue, crying spells and loss of interest in doing my usual stuff".   While a patient in this hospital, Ms. Julin received medication management as well as enrolled in group counseling and activities. She was medicated with Tegretol 200 mg three time daily. He also received Benadryl 25 mg daily for her allergies. Her previous medications, Lithium Carbonate, Clonazepam and Sertraline were all discontiued. She received Ibuprofen 200 mg on prn basis for complaint of headaches. She tolerated her treatment regimen without any significant adverse effects and or reactions.  She attended treatment team meeting this am and met with her treatment team members. Her condition and symptoms were discussed. Patient did state that she is stable for discharge. She will continue psychiatric care on outpatient basis to maintain stability. [facility]  With Dr. Mayford Knife for therapy and on 02/24/12 for medication management. Upon discharge, patient adamantly denies suicidal, homicidal ideations, auditory, visual hallucinations and or delusional thinking. Patient left [facility] with all personal belongings via personal arranged transport in no apparent distress.  Consults:  None  Significant Diagnostic Studies:  CMP, Lithium levels, TSH  Discharge Vitals:   Blood pressure 123/81, pulse 82, temperature 98.4 F (36.9 C), temperature source Oral, resp. rate 16, height 5\' 9"  (1.753 m), weight 83.915 kg (185 lb), last menstrual period 02/02/2012.  Mental  Status Exam: See Mental Status Examination and Suicide Risk Assessment completed by Attending Physician prior to discharge.  Discharge destination:  Home  Is patient on multiple antipsychotic therapies at discharge:  No   Has Patient had three or more failed trials of antipsychotic monotherapy by history:   No  Recommended Plan for Multiple Antipsychotic Therapies: NA  Discharge Orders    Future Orders Please Complete By Expires   Diet - low sodium heart healthy      Increase activity slowly        Medication List  As of 02/13/2012  4:18 PM   STOP taking these medications         ABILIFY 10 MG tablet      KLONOPIN 0.5 MG tablet      lithium carbonate 300 MG capsule      propranolol 10 MG tablet      sertraline 100 MG tablet         TAKE these medications      Indication    carbamazepine 200 MG tablet   Commonly known as: TEGRETOL   Take 1 tablet (200 mg total) by mouth 3 (three) times daily. For mood control       diphenhydrAMINE 25 mg capsule   Commonly known as: BENADRYL   Take 2 capsules (50 mg total) by mouth every 6 (six) hours as needed. For allergies.       ibuprofen 200 MG tablet   Commonly known as: ADVIL,MOTRIN   Take 3 tablets (600 mg total) by mouth every 8 (eight) hours as needed for pain or headache. For headache.            Follow-up Information    Follow up with [facility] on 02/20/2012. (Appointment scheduled at 11:00 am with Dr. Idamae Schuller for therapy)    Contact information:   [address]  [place], [place] [address] [phone]      Follow up with [facility] on 02/24/2012. (Appointment scheduled at 4:40 pm with Anne Fu, PA)    Contact information:   [address]  [place], [place] [address] [phone]         Follow-up recommendations:  Activity:  as tolerated. Other:  Keep all scheduled follow-up appointments as recommended.  Comments: Take all your medications as prescribed. Report to your outpatient provider any adverse effects of medications promptly. Patient is instructed to avoid any alcohol and or illegal drug use while on prescription medications. Instructed to call the crisis hotline, 911 and or go to the nearest ED in the event of worsening symptoms.  SignedArmandina Stammer I 02/13/2012, 4:18 PM
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
## 2012-02-13 NOTE — Discharge Summary (Signed)
I agree with this D/C Summary.  

## 2012-02-14 NOTE — Progress Notes (Signed)
Patient Discharge Instructions:  After Visit Summary (AVS):   Faxed to:  02/14/2012 Psychiatric Admission Assessment Note:   Faxed to:  02/14/2012 Suicide Risk Assessment - Discharge Assessment:   Faxed to:  02/14/2012 Faxed/Sent to the Next Level Care provider:  02/14/2012  Fax to: [facility]- Dr. Jaye Beagle, [place] @ [phone]   Wandra Scot, 02/14/2012, 5:41 PM

## 2017-09-12 HISTORY — PX: ESOPHAGECTOMY: SUR457

## 2017-11-24 LAB — PULMONARY FUNCTION TEST

## 2018-04-04 ENCOUNTER — Other Ambulatory Visit: Payer: Self-pay | Admitting: Gastroenterology

## 2018-04-04 DIAGNOSIS — R1310 Dysphagia, unspecified: Secondary | ICD-10-CM

## 2018-04-06 ENCOUNTER — Ambulatory Visit
Admission: RE | Admit: 2018-04-06 | Discharge: 2018-04-06 | Disposition: A | Discharge status: Home / Self Care | Payer: Self-pay | Source: Ambulatory Visit | Attending: Gastroenterology | Admitting: Gastroenterology

## 2018-04-06 DIAGNOSIS — R1310 Dysphagia, unspecified: Secondary | ICD-10-CM

## 2018-05-04 ENCOUNTER — Ambulatory Visit
Admission: RE | Admit: 2018-05-04 | Discharge: 2018-05-04 | Disposition: A | Discharge status: Home / Self Care | Payer: BLUE CROSS/BLUE SHIELD | Source: Ambulatory Visit | Attending: Gastroenterology | Admitting: Gastroenterology

## 2018-05-04 ENCOUNTER — Other Ambulatory Visit: Payer: Self-pay | Admitting: Gastroenterology

## 2018-05-04 DIAGNOSIS — K2289 Other specified disease of esophagus: Secondary | ICD-10-CM

## 2018-05-04 DIAGNOSIS — K228 Other specified diseases of esophagus: Secondary | ICD-10-CM

## 2018-05-04 MED ORDER — IOPAMIDOL (ISOVUE-300) INJECTION 61%
100.0000 mL | Freq: Once | INTRAVENOUS | Status: AC | PRN
  Administered 2018-05-04: 100 mL via INTRAVENOUS

## 2018-05-07 ENCOUNTER — Telehealth: Payer: Self-pay | Admitting: Oncology

## 2018-05-07 ENCOUNTER — Other Ambulatory Visit: Payer: Self-pay | Admitting: Gastroenterology

## 2018-05-07 DIAGNOSIS — K228 Other specified diseases of esophagus: Secondary | ICD-10-CM

## 2018-05-07 DIAGNOSIS — K2289 Other specified disease of esophagus: Secondary | ICD-10-CM

## 2018-05-07 NOTE — Telephone Encounter (Signed)
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 Pt has been scheduled to see Dr. Benay Spice on 8/28 at 215pm. [facility], GI Navigator will notify the pt.

## 2018-05-07 NOTE — Progress Notes (Signed)
Called patient to introduce myself and the role of GI navigator. I provided my contact information for future communication. Patient agreed to 8/28 appointment at 2:15 PM with Dr. Benay Spice and is aware to arrive 30 minutes prior.

## 2018-05-09 ENCOUNTER — Encounter: Payer: Self-pay | Admitting: *Deleted

## 2018-05-09 ENCOUNTER — Encounter: Payer: Self-pay | Admitting: Oncology

## 2018-05-09 ENCOUNTER — Inpatient Hospital Stay: Payer: BLUE CROSS/BLUE SHIELD | Attending: Oncology | Admitting: Oncology

## 2018-05-09 VITALS — BP 127/79 | HR 74 | Temp 98.7°F | Resp 17 | Ht 69.0 in | Wt 190.4 lb

## 2018-05-09 DIAGNOSIS — C160 Malignant neoplasm of cardia: Secondary | ICD-10-CM | POA: Diagnosis not present

## 2018-05-09 DIAGNOSIS — F17210 Nicotine dependence, cigarettes, uncomplicated: Secondary | ICD-10-CM

## 2018-05-09 DIAGNOSIS — C155 Malignant neoplasm of lower third of esophagus: Secondary | ICD-10-CM

## 2018-05-09 DIAGNOSIS — R1310 Dysphagia, unspecified: Secondary | ICD-10-CM

## 2018-05-09 DIAGNOSIS — G43909 Migraine, unspecified, not intractable, without status migrainosus: Secondary | ICD-10-CM | POA: Diagnosis not present

## 2018-05-09 DIAGNOSIS — F329 Major depressive disorder, single episode, unspecified: Secondary | ICD-10-CM | POA: Insufficient documentation

## 2018-05-09 DIAGNOSIS — F419 Anxiety disorder, unspecified: Secondary | ICD-10-CM | POA: Insufficient documentation

## 2018-05-09 DIAGNOSIS — F3162 Bipolar disorder, current episode mixed, moderate: Secondary | ICD-10-CM

## 2018-05-09 NOTE — Progress Notes (Signed)
  Oncology Nurse Navigator Documentation  Navigator Location: [facility] (05/09/18 1617)   )Navigator Encounter Type: Initial MedOnc (05/09/18 1617)  Met with patient, mother and friend after visit with Dr. Benay Spice. Contact information for [facility] team provided. Patient has my direct number and e-mail for questions or concerns.                   Patient Visit Type: MedOnc;Initial (05/09/18 1617)   Barriers/Navigation Needs: Education;Coordination of Care (05/09/18 1617) Education: Understanding Cancer/ Treatment Options;Coping with Diagnosis/ Prognosis;Newly Diagnosed Cancer Education (05/09/18 1617) Interventions: Referrals (05/09/18 1617) Referrals: Nutrition/dietician(Dr. Servando Snare) (05/09/18 1617)          Acuity: Level 2 (05/09/18 1617)   Acuity Level 2: Initial guidance, education and coordination as needed;Assistance expediting appointments;Ongoing guidance and education throughout treatment as needed (05/09/18 1617)     Time Spent with Patient: 30 (05/09/18 1617)

## 2018-05-09 NOTE — Progress Notes (Signed)
[facility] Patient Consult   Requesting MD: Joaquina Nissen 52 y.o.  02/10/1966    Reason for Consult: GE junction carcinoma   HPI: Rhonda Newton reports solid dysphagia for several months.  The dysphagia is worse with certain foods, especially bread.  She tolerates liquids without difficulty. She had similar symptoms last year that improved with a change in her diet. She was referred to Dr. Oletta Lamas.  A barium swallow on 04/06/2018.  A barium tablet passed quickly to the distal esophagus and then paused.  The barium tablet passed into the stomach after 2 additional swallows.  Contrast transit through the GE junction was sluggish in the area.    She was taken to an upper endoscopy 05/03/2018.  The lumen of the esophagus was mildly dilated.  A large ulcerated mass with bleeding was found at the GE junction from 34-38 cm from the incisors.  Biopsies were obtained.  Erosions without bleeding were found in the duodenum.  Biopsies were obtained.  The stomach appeared normal.  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  The duodenal biopsy returned as chronic peptic duodenitis with erosion   CTs of the chest, abdomen, and pelvis on 05/04/2018 revealed a circumferential mass involving the distal esophagus extending to below the GE junction.  A 4 mm nonspecific nodule was noted in the right upper lobe.  Past Medical History:  Diagnosis Date  .  G2, P1, 1 abortion   . Anxiety and depression   .  Migraine headaches      .  Colonoscopy   -sigmoid polyp number hyperplastic polyp                                                  01/05/2018                                                   .  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 INTRAUTERINE DEVICE INSERTION     Paraguard    .  Bilateral  cataracts Medications: Reviewed  Allergies:  Allergies  Allergen Reactions  . Benzodiazepines     Clouding of cognition and disinhibition    Family history: Her mother, a maternal aunt, and sister have a history of breast cancer.  A paternal aunt had breast cancer.  Her mother and maternal aunt have a history of Barrett's esophagus  Social History:   She lives alone in [place].  She works as an Glass blower/designer.  She smokes cigarettes.  She reports social alcohol use.  No transfusion history.  No risk factor for HIV or hepatitis.  ROS:   Positives include: Solid dysphasia, intermittent migraine headaches, frontal hair loss, feeling of fullness in the subxiphoid region  A complete ROS was otherwise negative.  Physical Exam:  Blood pressure 127/79, pulse 74, temperature 98.7 F (37.1 C), temperature source Oral, resp. rate 17, height _0  (1.753 m), weight 190 lb 6.4 oz (86.4 kg), SpO2 100 %.  HEENT: Oropharynx without visible  mass, neck without mass Lungs: Clear bilaterally Cardiac: Regular rate and rhythm Abdomen: No hepatosplenomegaly, no mass, nontender  Vascular: No leg edema Lymph nodes: No cervical, supraclavicular, axillary, or inguinal nodes Neurologic: Alert and oriented, the motor exam appears intact in the upper and lower extremities bilaterally Skin: No rash Musculoskeletal: No spine tenderness   LAB:   Imaging:  As per HPI, CT images from 05/04/2018 reviewed with Ms Furnari   Assessment/Plan:   1. 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 Migraine headaches 3.   Multiple family members with breast cancer 4.   Tobacco  use  Disposition:   Rhonda Newton has been diagnosed with adenocarcinoma of the GE junction.  I discussed treatment options with Ms. Groeneveld.  She appears to have disease localized to the GE junction based on the staging evaluation to date.  I explained the standard treatment algorithm for esophagus and gastroesophageal cancers combining chemotherapy, radiation, and surgery.  I recommend treatment with weekly Taxol/carboplatin and concurrent radiation if further staging confirms locally advanced disease.  We made referrals to Dr. Lisbeth Renshaw and Dr. Servando Snare.  She will undergo an endoscopic ultrasound by Dr. Paulita Fujita later this week.  She will be scheduled for a staging PET scan.  We will follow-up on HER-2 testing from the endoscopic biopsy.  Mr. Trim will follow a mechanical soft and liquid diet as tolerated.  She will return for an office visit and further discussion on 05/16/2018.  Betsy Coder, MD  05/09/2018, 4:15 PM

## 2018-05-09 NOTE — Progress Notes (Signed)
GI Location of Tumor / Histology: Malignant neoplasm of lower third of esophagus  Rhonda Newton presented with feelings of food becoming lodged/stuck in her throat, 6 months.  Acid reflux horribly about 6 months ago.  Endo ultrasound 05/11/2018  PET ordered, unscheduled at this time.  CT CAP 05/04/2018: The trachea appears patent and is midline.  Circumferential mass involving the distal esophagus measures 3.1 x 3.2 x 5.9 cm.  The mass extends to just below the level of the GE junction.  No findings to suggest metastatic adenopathy or distant metastatic disease within the chest or abdomen.  4 mm right upper lobe lung nodule noted.  Nonspecific.  Upper Endoscopy 05/03/2018: The lumen of the esophagus was mildly dilated.  A large ulcerated mass with bleeding was found at the GE junction from 34-38 cm from the incisors.  Biopsies were obtained.  Erosions without bleeding were found in the duodenum.  Biopsies were obtained.  The stomach appeared normal.  Esophagus Xray 04/06/2018: Sluggish flow of contrast and a 12.5 mm barium tablet through the gastroesophageal junction, which never seemed adequately distended exam despite multiple contrast swallows.  Although this may simply reflect the presence of a small hiatal hernia, given the family history of Barrett's Esophagus I do recommend further evaluation with EGD. Normal Esophagram otherwise.  Biopsies of   Past/Anticipated interventions by surgeon, if any:   Past/Anticipated interventions by medical oncology, if any:  Dr. Benay Spice 05/09/2018 - I recommend treatment with weekly Taxol/carboplatin and concurrent radiation if further staging confirms locally advanced disease. -We made referrals to Dr. Lisbeth Renshaw and Dr. Servando Snare.   -She will undergo an endoscopic ultrasound by Dr. Paulita Fujita later this week.   -She will be scheduled for a staging PET scan.   -We will follow-up on HER-2 testing from the endoscopic biopsy. -chemo/radiation then surgery.   Weight  changes, if any: 5 pounds weight loss unintentional.  Bowel/Bladder complaints, if any: Constipated more here lately.  Nausea / Vomiting, if any: No  Pain issues, if any:  No  BP 122/83 (BP Location: Left Arm, Patient Position: Sitting)   Pulse 76   Temp 97.9 F (36.6 C) (Oral)   Resp 18   Ht 5' 9" (1.753 m)   Wt 190 lb 12.8 oz (86.5 kg)   SpO2 99%   BMI 28.18 kg/m    Wt Readings from Last 3 Encounters:  05/10/18 190 lb 12.8 oz (86.5 kg)  05/09/18 190 lb 6.4 oz (86.4 kg)    SAFETY ISSUES:  Prior radiation? No  Pacemaker/ICD? No  Possible current pregnancy? No, IUD in place  Is the patient on methotrexate? No  Current Complaints/Details:

## 2018-05-10 ENCOUNTER — Ambulatory Visit
Admission: RE | Admit: 2018-05-10 | Discharge: 2018-05-10 | Disposition: A | Discharge status: Home / Self Care | Payer: BLUE CROSS/BLUE SHIELD | Source: Ambulatory Visit | Attending: Radiation Oncology | Admitting: Radiation Oncology

## 2018-05-10 ENCOUNTER — Other Ambulatory Visit: Payer: Self-pay

## 2018-05-10 ENCOUNTER — Encounter: Payer: Self-pay | Admitting: Radiation Oncology

## 2018-05-10 VITALS — BP 122/83 | HR 76 | Temp 97.9°F | Resp 18 | Ht 69.0 in | Wt 190.8 lb

## 2018-05-10 DIAGNOSIS — C155 Malignant neoplasm of lower third of esophagus: Secondary | ICD-10-CM | POA: Insufficient documentation

## 2018-05-10 DIAGNOSIS — Z79899 Other long term (current) drug therapy: Secondary | ICD-10-CM | POA: Diagnosis not present

## 2018-05-10 DIAGNOSIS — F17210 Nicotine dependence, cigarettes, uncomplicated: Secondary | ICD-10-CM | POA: Insufficient documentation

## 2018-05-10 DIAGNOSIS — C160 Malignant neoplasm of cardia: Secondary | ICD-10-CM | POA: Diagnosis present

## 2018-05-10 LAB — CMP (CANCER CENTER ONLY)
ALT: 15 U/L (ref 0–44)
ANION GAP: 8 (ref 5–15)
AST: 15 U/L (ref 15–41)
Albumin: 4.2 g/dL (ref 3.5–5.0)
Alkaline Phosphatase: 81 U/L (ref 38–126)
BILIRUBIN TOTAL: 0.4 mg/dL (ref 0.3–1.2)
BUN: 18 mg/dL (ref 6–20)
CO2: 28 mmol/L (ref 22–32)
Calcium: 9.4 mg/dL (ref 8.9–10.3)
Chloride: 103 mmol/L (ref 98–111)
Creatinine: 0.99 mg/dL (ref 0.44–1.00)
GFR, Estimated: >60 mL/min (ref >60)
Glucose, Bld: 86 mg/dL (ref 70–99)
POTASSIUM: 4.4 mmol/L (ref 3.5–5.1)
Sodium: 139 mmol/L (ref 135–145)
TOTAL PROTEIN: 7.1 g/dL (ref 6.5–8.1)

## 2018-05-10 LAB — CBC WITH DIFFERENTIAL (CANCER CENTER ONLY)
BASOS ABS: 0.1 10*3/uL (ref 0.0–0.1)
BASOS PCT: 1 %
EOS ABS: 0.2 10*3/uL (ref 0.0–0.5)
EOS PCT: 2 %
HCT: 41.9 % (ref 34.8–46.6)
Hemoglobin: 13.9 g/dL (ref 11.6–15.9)
LYMPHS ABS: 1.9 10*3/uL (ref 0.9–3.3)
Lymphocytes Relative: 30 %
MCH: 27.6 pg (ref 25.1–34.0)
MCHC: 33.1 g/dL (ref 31.5–36.0)
MCV: 83.5 fL (ref 79.5–101.0)
Monocytes Absolute: 0.6 10*3/uL (ref 0.1–0.9)
Monocytes Relative: 10 %
Neutro Abs: 3.6 10*3/uL (ref 1.5–6.5)
Neutrophils Relative %: 57 %
Platelet Count: 267 10*3/uL (ref 145–400)
RBC: 5.02 MIL/uL (ref 3.70–5.45)
RDW: 13.4 % (ref 11.2–14.5)
WBC: 6.4 10*3/uL (ref 3.9–10.3)

## 2018-05-10 NOTE — Progress Notes (Signed)
Radiation Oncology         [phone] ________________________________  Name: Rhonda Newton        MRN: [id number]  Date of Service: 05/10/2018 DOB: 11-18-1965  JJ:OACZ, Benjamine Mola, NP  Ladell Pier, MD     REFERRING PHYSICIAN: Ladell Pier, MD   DIAGNOSIS: The encounter diagnosis was Malignant neoplasm of lower third of esophagus ([place]).   HISTORY OF PRESENT ILLNESS: Rhonda Newton is a 52 y.o. female seen at the request of Dr. Benay Spice for a new diagnosis of esophageal/GE junction adenocarcinoma. The patient reports about 4 months of progressive dysphagia, which prompted her to be evaluated. A barium swallow on 04/06/18 revealing sluggish flow of contrast and took two additional swallows to pass into the stomach. She was seen by GI and went to the endoscopy suite with Dr. Oletta Lamas on 05/03/18 and a large ulcerated mass was noted in the GE junction 34-38 cm from the incisors. She had bleeding at the mass, and erosions of the duodenum were noted that were not bleeding. 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 The duodenum had evidence of chronic peptic duodenitis. CT C/A/P on 05/04/18 revealed a circumferential 5.9 cm mass in the distal esophagus that extended below the level of the GE junction. She had a 4 mm indeterminate nodule in the RUL. No evidence of metastatic disease is noted.    PREVIOUS RADIATION THERAPY: No   PAST MEDICAL HISTORY:  Past Medical History:  Diagnosis Date  . Anxiety   . Anxiety and depression   . Depression        PAST SURGICAL HISTORY: Past Surgical History:  Procedure Laterality Date  . cataracts Bilateral    age 49  . INTRAUTERINE DEVICE INSERTION     Paraguard     FAMILY HISTORY:  Family History  Problem Relation Age of Onset  . Barrett's esophagus Mother   . Hypertension Mother   . Breast cancer Mother   . Breast cancer Sister   . Breast cancer  Maternal Aunt   . Breast cancer Paternal Aunt      SOCIAL HISTORY:  reports that she has been smoking cigarettes. She has been smoking about 1.00 pack per day. She has never used smokeless tobacco. She reports that she has current or past drug history. Drug: Marijuana. She reports that she does not drink alcohol. 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   ALLERGIES: Benzodiazepines   MEDICATIONS:  Current Outpatient Medications  Medication Sig Dispense Refill  . ibuprofen (ADVIL,MOTRIN) 200 MG tablet Take 200 mg by mouth every 6 (six) hours as needed.    Marland Kitchen omeprazole (PRILOSEC) 20 MG capsule     . SUMAtriptan (IMITREX) 25 MG tablet Take 25 mg by mouth every 2 (two) hours as needed for migraine. May repeat in 2 hours if headache persists or recurs.     No current facility-administered medications for this encounter.      REVIEW OF SYSTEMS: On review of systems, the patient reports that she is doing well overall. She is eating most foods but typically gravitates toward soft foods. She denies any chest pain, shortness of breath, cough, fevers, chills, night sweats, unintended weight changes. She denies any bowel or bladder disturbances, and denies abdominal pain, nausea or vomiting. She denies any new musculoskeletal or joint aches or pains. A complete review of systems is  obtained and is otherwise negative.     PHYSICAL EXAM:  Wt Readings from Last 3 Encounters:  05/10/18 190 lb 12.8 oz (86.5 kg)  05/09/18 190 lb 6.4 oz (86.4 kg)   Temp Readings from Last 3 Encounters:  05/10/18 97.9 F (36.6 C) (Oral)  05/09/18 98.7 F (37.1 C) (Oral)  07/19/11 98.2 F (36.8 C) (Oral)   BP Readings from Last 3 Encounters:  05/10/18 122/83  05/09/18 127/79  07/19/11 122/84   Pulse Readings from Last 3 Encounters:  05/10/18 76  05/09/18 74  07/19/11 70   Pain Assessment Pain Score: 0-No pain/10  In general this is a  well appearing caucasian female in no acute distress. She is alert and oriented x4 and appropriate throughout the examination. HEENT reveals that the patient is normocephalic, atraumatic. EOMs are intact. Skin is intact without any evidence of gross lesions, but she has RLE petechiae along the medial calf and has visible deep vericosities. Cardiopulmonary assessment is negative for acute distress and she exhibits normal effort.    ECOG = 1  0 - Asymptomatic (Fully active, able to carry on all predisease activities without restriction)  1 - Symptomatic but completely ambulatory (Restricted in physically strenuous activity but ambulatory and able to carry out work of a light or sedentary nature. For example, light housework, office work)  2 - Symptomatic, <50% in bed during the day (Ambulatory and capable of all self care but unable to carry out any work activities. Up and about more than 50% of waking hours)  3 - Symptomatic, >50% in bed, but not bedbound (Capable of only limited self-care, confined to bed or chair 50% or more of waking hours)  4 - Bedbound (Completely disabled. Cannot carry on any self-care. Totally confined to bed or chair)  5 - Death   Eustace Pen MM, Creech RH, Tormey DC, et al. [phone]). "Toxicity and response criteria of the [facility] Group". [place] Oncol. 5 (6): 649-55    LABORATORY DATA:  No results found for: WBC, HGB, HCT, MCV, PLT Lab Results  Component Value Date   NA 137 02/10/2012   K 3.7 02/10/2012   CL 103 02/10/2012   CO2 23 02/10/2012   Lab Results  Component Value Date   ALT 18 02/10/2012   AST 15 02/10/2012   ALKPHOS 62 02/10/2012   BILITOT 0.3 02/10/2012      RADIOGRAPHY: Ct Chest W Contrast  Result Date: 05/04/2018 CLINICAL DATA:  Evaluate for esophageal mass. Dysphasia with solid foods. EXAM: CT CHEST AND ABDOMEN WITH CONTRAST TECHNIQUE: Multidetector CT imaging of the chest and abdomen was performed following the  standard protocol during bolus administration of intravenous contrast. CONTRAST:  185mL ISOVUE-300 IOPAMIDOL (ISOVUE-300) INJECTION 61% COMPARISON:  Esophagram 04/06/2018 FINDINGS: CT CHEST FINDINGS Cardiovascular: Normal heart size.  No pericardial effusion. Mediastinum/Nodes: Normal appearance of the thyroid gland. No enlarged axillary or supraclavicular adenopathy. No mediastinal adenopathy identified. The trachea appears patent and is midline. Circumferential mass involving the distal esophagus measures 3.1 by 3.2 by 5.9 cm. The mass extends to just below the level of the GE junction. Lungs/Pleura: No pleural effusion identified. No airspace consolidation, atelectasis or pneumothorax. Small nodule in the right upper lobe is nonspecific measuring 4 mm, image 22/4. Musculoskeletal: No chest wall mass or suspicious bone lesions identified. CT ABDOMEN FINDINGS Hepatobiliary: No focal liver abnormality is seen. No gallstones, gallbladder wall thickening, or biliary dilatation. Pancreas: Unremarkable. No pancreatic ductal dilatation or surrounding inflammatory changes. Spleen: Normal  in size without focal abnormality. Adrenals/Urinary Tract: Normal adrenal glands. Normal appearance of the right kidney. Small cyst in the upper pole of left kidney measures 7 mm. Stomach/Bowel: Distal esophageal mass protrudes into the proximal stomach just below the GE junction, image number 8/6. the visualized small and large bowel loops are unremarkable. Vascular/Lymphatic: Normal appearance of the abdominal aorta. No enlarged abdominal lymph nodes. Other: No free fluid or fluid collections. Musculoskeletal: No acute or significant osseous findings. IMPRESSION: 1. Distal esophageal mass is identified measuring approximately 5.9 cm and extends just below the level of the GE junction. Findings concerning for esophageal carcinoma. 2. No findings to suggest metastatic adenopathy or distant metastatic disease within the chest or abdomen.  3. 4 mm right upper lobe lung nodule noted.  Nonspecific. Electronically Signed   By: Kerby Moors M.D.   On: 05/04/2018 15:24   Ct Abdomen W Contrast  Result Date: 05/04/2018 CLINICAL DATA:  Evaluate for esophageal mass. Dysphasia with solid foods. EXAM: CT CHEST AND ABDOMEN WITH CONTRAST TECHNIQUE: Multidetector CT imaging of the chest and abdomen was performed following the standard protocol during bolus administration of intravenous contrast. CONTRAST:  180mL ISOVUE-300 IOPAMIDOL (ISOVUE-300) INJECTION 61% COMPARISON:  Esophagram 04/06/2018 FINDINGS: CT CHEST FINDINGS Cardiovascular: Normal heart size.  No pericardial effusion. Mediastinum/Nodes: Normal appearance of the thyroid gland. No enlarged axillary or supraclavicular adenopathy. No mediastinal adenopathy identified. The trachea appears patent and is midline. Circumferential mass involving the distal esophagus measures 3.1 by 3.2 by 5.9 cm. The mass extends to just below the level of the GE junction. Lungs/Pleura: No pleural effusion identified. No airspace consolidation, atelectasis or pneumothorax. Small nodule in the right upper lobe is nonspecific measuring 4 mm, image 22/4. Musculoskeletal: No chest wall mass or suspicious bone lesions identified. CT ABDOMEN FINDINGS Hepatobiliary: No focal liver abnormality is seen. No gallstones, gallbladder wall thickening, or biliary dilatation. Pancreas: Unremarkable. No pancreatic ductal dilatation or surrounding inflammatory changes. Spleen: Normal in size without focal abnormality. Adrenals/Urinary Tract: Normal adrenal glands. Normal appearance of the right kidney. Small cyst in the upper pole of left kidney measures 7 mm. Stomach/Bowel: Distal esophageal mass protrudes into the proximal stomach just below the GE junction, image number 8/6. the visualized small and large bowel loops are unremarkable. Vascular/Lymphatic: Normal appearance of the abdominal aorta. No enlarged abdominal lymph nodes. Other:  No free fluid or fluid collections. Musculoskeletal: No acute or significant osseous findings. IMPRESSION: 1. Distal esophageal mass is identified measuring approximately 5.9 cm and extends just below the level of the GE junction. Findings concerning for esophageal carcinoma. 2. No findings to suggest metastatic adenopathy or distant metastatic disease within the chest or abdomen. 3. 4 mm right upper lobe lung nodule noted.  Nonspecific. Electronically Signed   By: Kerby Moors M.D.   On: 05/04/2018 15:24       IMPRESSION/PLAN: 1. Adenocarcinoma of the distal esophagus extending into the GE junction. Dr. Lisbeth Renshaw discusses the pathology findings and reviews the nature of esophageal/GE junction cancers. He reviews the rationale to complete the work up with PET and EUS. She will also meet with Dr. Servando Snare to discuss esophagectomy following neoadjuvant chemoRT. She is scheduled for PET imaging on 05/22/18. We discussed the risks, benefits, short, and long term effects of radiotherapy, and the patient is interested in proceeding. Dr. Lisbeth Renshaw discusses the delivery and logistics of radiotherapy and anticipates a course of 5 1/2 weeks of radiotherapy.Written consent is obtained and placed in the chart, a copy was provided to  the patient. With the timing of her PET imaging, we anticipate simulation next week, and she will be contacted for this, but anticipate treatment to begin on 05/28/18. 2. Possible genetic predisposition to malignancy. The patient is a candidate for genetic testing given her family history. She was offered referral and accepts and will be scheduled for this.  In a visit lasting 60 minutes, greater than 50% of the time was spent face to face discussing her case, and coordinating the patient's care.   The above documentation reflects my direct findings during this shared patient visit. Please see the separate note by Dr. Lisbeth Renshaw on this date for the remainder of the patient's plan of  care.    [name], PAC

## 2018-05-11 ENCOUNTER — Telehealth: Payer: Self-pay | Admitting: Nurse Practitioner

## 2018-05-11 NOTE — Telephone Encounter (Signed)
Scheduled appt per 8/29 sch message - pt is aware of appt date and time.   

## 2018-05-15 ENCOUNTER — Inpatient Hospital Stay: Payer: BLUE CROSS/BLUE SHIELD | Attending: Oncology

## 2018-05-15 ENCOUNTER — Telehealth: Payer: Self-pay | Admitting: *Deleted

## 2018-05-15 DIAGNOSIS — Z23 Encounter for immunization: Secondary | ICD-10-CM | POA: Insufficient documentation

## 2018-05-15 DIAGNOSIS — Z803 Family history of malignant neoplasm of breast: Secondary | ICD-10-CM | POA: Insufficient documentation

## 2018-05-15 DIAGNOSIS — R21 Rash and other nonspecific skin eruption: Secondary | ICD-10-CM | POA: Insufficient documentation

## 2018-05-15 DIAGNOSIS — Z5111 Encounter for antineoplastic chemotherapy: Secondary | ICD-10-CM | POA: Insufficient documentation

## 2018-05-15 DIAGNOSIS — C160 Malignant neoplasm of cardia: Secondary | ICD-10-CM | POA: Insufficient documentation

## 2018-05-15 DIAGNOSIS — Z720 Tobacco use: Secondary | ICD-10-CM | POA: Insufficient documentation

## 2018-05-15 NOTE — Progress Notes (Signed)
Nutrition Assessment   Reason for Assessment:   New diagnosis of esophageal cancer   ASSESSMENT:   52 year old female with adenocarcinoma of GE junction.  Past medical history of depression, anxiety.  Planning chemotherapy and radiation therapy.    Met with patient in office this am.  Patient anxious. Reports that she loves to cook.  Reports that she has not eaten anything today so far but just had coffee.  Reports that bread does not work as she can't get it down.  Reports that she usually drinks an ensure plus for breakfast. Has been able to eat quiche, cottage cheese, yogurt, soggy cereal, ice cream, chicken pie (finely shredded chicken), souffle.  Has been drinking water and coke without difficulty.    Patient reports eating smaller volume of foods and it taking her longer to eat due to dysphagia.   Nutrition Focused Physical Exam: deferred   Medications: prilosec   Labs: reviewed   Anthropometrics:   Height: 69 inches Weight: 190 lb 12.8 oz UBW: 195 lb per patient BMI: 28 3% weight loss in the last 4 months, not significant  Estimated Energy Needs  Kcals: 2150-2580 calories/d Protein: 99-132 g (Using IBW of 66kg, 1.5-2.0 g/kg) Fluid: 2.5 L/d   NUTRITION DIAGNOSIS: Inadequate oral intake related to esophageal cancer as evidenced by 3% weight loss in the last 4 months, dysphagia to certain foods, and eating smaller volume of foods.    MALNUTRITION DIAGNOSIS: continue to monitor   INTERVENTION:  Discussed importance of good nutrition during treatments.   Encouraged chopping, grinding, pureeing foods for ease of swallowing. Encouraged adding gravies, sauces, condiments to food to change consistency and increase calories.  Gave samples of boost plus and 1st complimentary case of ensure enlive.   Provided high calorie, high protein recipes to patient as well.     MONITORING, EVALUATION, GOAL: Patient will consume adequate calories and protein during treatment to  maintain weight and nutrition   Next Visit: September 24 before radiation  Alise Calais B. Zenia Resides, [place], [place] Registered Dietitian [phone] (pager)

## 2018-05-15 NOTE — Telephone Encounter (Signed)
Left the patient a voicemail letting her know that the results of her blood work were normal.  Left a call back number in the event she had any questions.  Will continue to follow as necessary.  Gloriajean Dell. Leonie Green, BSN

## 2018-05-16 ENCOUNTER — Encounter: Payer: Self-pay | Admitting: Medical Oncology

## 2018-05-16 ENCOUNTER — Encounter: Payer: Self-pay | Admitting: General Practice

## 2018-05-16 ENCOUNTER — Telehealth: Payer: Self-pay

## 2018-05-16 ENCOUNTER — Inpatient Hospital Stay: Payer: BLUE CROSS/BLUE SHIELD

## 2018-05-16 ENCOUNTER — Inpatient Hospital Stay (HOSPITAL_BASED_OUTPATIENT_CLINIC_OR_DEPARTMENT_OTHER): Payer: BLUE CROSS/BLUE SHIELD | Admitting: Nurse Practitioner

## 2018-05-16 ENCOUNTER — Telehealth: Payer: Self-pay | Admitting: Emergency Medicine

## 2018-05-16 ENCOUNTER — Other Ambulatory Visit: Payer: Self-pay | Admitting: Medical Oncology

## 2018-05-16 VITALS — BP 103/75 | HR 62 | Temp 98.3°F | Resp 18 | Ht 69.0 in | Wt 191.4 lb

## 2018-05-16 DIAGNOSIS — C155 Malignant neoplasm of lower third of esophagus: Secondary | ICD-10-CM

## 2018-05-16 DIAGNOSIS — Z803 Family history of malignant neoplasm of breast: Secondary | ICD-10-CM | POA: Diagnosis not present

## 2018-05-16 DIAGNOSIS — Z23 Encounter for immunization: Secondary | ICD-10-CM | POA: Diagnosis not present

## 2018-05-16 DIAGNOSIS — Z720 Tobacco use: Secondary | ICD-10-CM | POA: Diagnosis not present

## 2018-05-16 DIAGNOSIS — C160 Malignant neoplasm of cardia: Secondary | ICD-10-CM

## 2018-05-16 DIAGNOSIS — Z5111 Encounter for antineoplastic chemotherapy: Secondary | ICD-10-CM | POA: Diagnosis not present

## 2018-05-16 DIAGNOSIS — R21 Rash and other nonspecific skin eruption: Secondary | ICD-10-CM | POA: Diagnosis not present

## 2018-05-16 LAB — RESEARCH LABS

## 2018-05-16 MED ORDER — DEXAMETHASONE 4 MG PO TABS: ORAL_TABLET | ORAL | 0 refills | Status: DC

## 2018-05-16 MED ORDER — PROCHLORPERAZINE MALEATE 10 MG PO TABS: 10.0000 mg | ORAL_TABLET | Freq: Four times a day (QID) | ORAL | 2 refills | Status: DC | PRN

## 2018-05-16 NOTE — Progress Notes (Signed)
[facility] Psychosocial Distress Screening Clinical Social Work  Clinical Social Work was referred by distress screening protocol.  The patient scored a 8 on the Psychosocial Distress Thermometer which indicates moderate distress. Clinical Social Worker contacted patient by phone to assess for distress and other psychosocial needs. Brief call to patient to discuss options available via [place].  Will mail information packet so patient can review and follow up if needed/desired.    ONCBCN DISTRESS SCREENING 05/10/2018  Screening Type Initial Screening  Distress experienced in past week (1-10) 8  Family Problem type Other (comment)    Clinical Social Worker follow up needed: No.  If yes, follow up plan:  Beverely Pace, [place], LCSW Clinical Social Worker Phone:  [phone]

## 2018-05-16 NOTE — Telephone Encounter (Signed)
Printed avs and calender of upcoming appointment. Per 9/4 los 

## 2018-05-16 NOTE — Telephone Encounter (Signed)
Patient does not need prior auth for PET scan per [facility]

## 2018-05-16 NOTE — Progress Notes (Addendum)
  [facility] OFFICE PROGRESS NOTE   Diagnosis: GE junction carcinoma  INTERVAL HISTORY:   Rhonda Newton returns as scheduled.  She is tolerating liquids and soft solids.  No bread.  She denies pain with swallowing.  No nausea or vomiting.  Bowels are moving.  Objective:  Vital signs in last 24 hours:  Blood pressure 103/75, pulse 62, temperature 98.3 F (36.8 C), temperature source Oral, resp. rate 18, height '5\' 9"'$  (1.753 m), weight 191 lb 6.4 oz (86.8 kg), SpO2 100 %.    HEENT: No thrush or ulcers. Resp: Lungs clear bilaterally. Cardio: Regular rate and rhythm. GI: Abdomen soft and nontender.  No hepatomegaly. Vascular: No leg edema.  Skin: No rash.   Lab Results:  Lab Results  Component Value Date   WBC 6.4 05/10/2018   HGB 13.9 05/10/2018   HCT 41.9 05/10/2018   MCV 83.5 05/10/2018   PLT 267 05/10/2018   NEUTROABS 3.6 05/10/2018    Imaging:  No results found.  Medications: I have reviewed the patient's current medications.  Assessment/Plan: 1. Gastroesophageal carcinoma ? 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 CTs 05/04/2018, distal esophagus mass extending to to below the GE junction, no evidence of metastatic disease, 4 mm nonspecific right upper lobe nodule ? Upper EUS 05/11/2018- large fungating ulcerating mass in the distal esophagus about 34 to 38 cm from the incisors, tumor extends to the GE junction; staged T3 N1 MX 2. Migraine headaches 3. Multiple family members with breast cancer 4. Tobacco use  Disposition: Rhonda Newton appears stable.  She is scheduled for a staging PET scan 05/22/2018, CT simulation 05/22/2018.  She is scheduled to begin radiation 05/28/2018.  The plan is for concurrent weekly Taxol/carboplatin chemotherapy during the course of radiation.  We reviewed potential  toxicities associated with chemotherapy including bone marrow toxicity, nausea, hair loss, allergic reaction.  We discussed the neuropathy and arthralgias associated with Taxol.  She understands the rationale for dexamethasone premedication.  Prescriptions were sent to her pharmacy for dexamethasone and Compazine.  She will attend a chemotherapy education class.  She will return for the first weekly Taxol/carboplatin on 05/29/2018.  She has not had a menstrual cycle in 1-1/2 years.  She understands she should not become pregnant while on chemotherapy.  She will return for lab, follow-up and week 2 Taxol/carboplatin on 06/05/2018.  She will contact the office in the interim with any problems.  Patient seen with Dr. Benay Spice.  25 minutes were spent face-to-face at today's visit with the majority of that time involved in counseling/coordination of care.    Ned Card ANP/GNP-BC   05/16/2018  12:30 PM  This was a shared visit with Ned Card.  We discussed the EUS results with Rhonda Newton. Her case was presented at the GI tumor conference today.  The treatment recommendation is to proceed with neoadjuvant Taxol/carboplatin and radiation.  She has been referred to Dr. Servando Snare.  The plan is to begin Taxol/carboplatin on 05/29/2018.  We reviewed potential toxicities associated with this regimen.  She agrees to proceed.  She will attend a chemotherapy teaching class.  Julieanne Manson, MD

## 2018-05-16 NOTE — Progress Notes (Signed)
START ON PATHWAY REGIMEN - Gastroesophageal     Administer weekly during RT:     Paclitaxel      Carboplatin   **Always confirm dose/schedule in your pharmacy ordering system**  Patient Characteristics: Esophageal & GE Junction, Adenocarcinoma, Preoperative or Nonsurgical Candidate (Clinical Staging), cT2 or Higher or cN+, Surgical Candidate (Up to cT4a) - Preoperative Therapy, GE Junction Histology: Adenocarcinoma Disease Classification: GE Junction Therapeutic Status: Preoperative or Nonsurgical Candidate (Clinical Staging) AJCC Grade: Staged < 8th Ed. AJCC 8 Stage Grouping: Staged < 8th Ed. AJCC T Category: Staged < 8th Ed. AJCC N Category: Staged < 8th Ed. AJCC M Category: Staged < 8th Ed. Intent of Therapy: Curative Intent, Discussed with Patient 

## 2018-05-17 ENCOUNTER — Telehealth: Payer: Self-pay | Admitting: Oncology

## 2018-05-17 ENCOUNTER — Inpatient Hospital Stay: Payer: BLUE CROSS/BLUE SHIELD

## 2018-05-17 NOTE — Telephone Encounter (Signed)
Pt r/s upcoming appts.

## 2018-05-18 ENCOUNTER — Ambulatory Visit
Admission: RE | Admit: 2018-05-18 | Discharge: 2018-05-18 | Disposition: A | Discharge status: Home / Self Care | Payer: BLUE CROSS/BLUE SHIELD | Source: Ambulatory Visit | Attending: Oncology | Admitting: Oncology

## 2018-05-18 DIAGNOSIS — R911 Solitary pulmonary nodule: Secondary | ICD-10-CM | POA: Diagnosis not present

## 2018-05-18 DIAGNOSIS — C160 Malignant neoplasm of cardia: Secondary | ICD-10-CM | POA: Diagnosis present

## 2018-05-18 DIAGNOSIS — K2270 Barrett's esophagus without dysplasia: Secondary | ICD-10-CM

## 2018-05-18 HISTORY — DX: Malignant neoplasm of cardia: C16.0

## 2018-05-18 HISTORY — DX: Barrett's esophagus without dysplasia: K22.70

## 2018-05-18 LAB — GLUCOSE, CAPILLARY: Glucose-Capillary: 86 mg/dL (ref 70–99)

## 2018-05-18 MED ORDER — FLUDEOXYGLUCOSE F - 18 (FDG) INJECTION
9.9000 | Freq: Once | INTRAVENOUS | Status: AC | PRN
  Administered 2018-05-18: 10.62 via INTRAVENOUS

## 2018-05-22 ENCOUNTER — Ambulatory Visit
Admission: RE | Admit: 2018-05-22 | Discharge: 2018-05-22 | Disposition: A | Discharge status: Home / Self Care | Payer: BLUE CROSS/BLUE SHIELD | Source: Ambulatory Visit | Attending: Radiation Oncology | Admitting: Radiation Oncology

## 2018-05-22 ENCOUNTER — Ambulatory Visit (HOSPITAL_COMMUNITY): Payer: Self-pay

## 2018-05-22 ENCOUNTER — Telehealth: Payer: Self-pay | Admitting: Radiation Oncology

## 2018-05-22 DIAGNOSIS — C155 Malignant neoplasm of lower third of esophagus: Secondary | ICD-10-CM

## 2018-05-22 NOTE — Telephone Encounter (Signed)
I called and spoke with the patient to make sure she knew the results from her PET scan and plans to start treatment Monday.

## 2018-05-25 DIAGNOSIS — C155 Malignant neoplasm of lower third of esophagus: Secondary | ICD-10-CM | POA: Diagnosis not present

## 2018-05-26 ENCOUNTER — Other Ambulatory Visit: Payer: Self-pay | Admitting: Oncology

## 2018-05-28 ENCOUNTER — Inpatient Hospital Stay: Payer: BLUE CROSS/BLUE SHIELD

## 2018-05-28 ENCOUNTER — Ambulatory Visit
Admission: RE | Admit: 2018-05-28 | Discharge: 2018-05-28 | Disposition: A | Discharge status: Home / Self Care | Payer: BLUE CROSS/BLUE SHIELD | Source: Ambulatory Visit | Attending: Radiation Oncology | Admitting: Radiation Oncology

## 2018-05-28 DIAGNOSIS — Z5111 Encounter for antineoplastic chemotherapy: Secondary | ICD-10-CM | POA: Diagnosis not present

## 2018-05-28 DIAGNOSIS — C155 Malignant neoplasm of lower third of esophagus: Secondary | ICD-10-CM | POA: Diagnosis not present

## 2018-05-28 LAB — CMP (CANCER CENTER ONLY)
ALK PHOS: 77 U/L (ref 38–126)
ALT: 19 U/L (ref 0–44)
AST: 16 U/L (ref 15–41)
Albumin: 4 g/dL (ref 3.5–5.0)
Anion gap: 8 (ref 5–15)
BUN: 19 mg/dL (ref 6–20)
CO2: 28 mmol/L (ref 22–32)
CREATININE: 0.9 mg/dL (ref 0.44–1.00)
Calcium: 9.3 mg/dL (ref 8.9–10.3)
Chloride: 103 mmol/L (ref 98–111)
GFR, Est AFR Am: >60 mL/min (ref >60)
GLUCOSE: 96 mg/dL (ref 70–99)
Potassium: 4.1 mmol/L (ref 3.5–5.1)
Sodium: 139 mmol/L (ref 135–145)
TOTAL PROTEIN: 7 g/dL (ref 6.5–8.1)
Total Bilirubin: 0.4 mg/dL (ref 0.3–1.2)

## 2018-05-28 LAB — CBC WITH DIFFERENTIAL (CANCER CENTER ONLY)
Basophils Absolute: 0 10*3/uL (ref 0.0–0.1)
Basophils Relative: 1 %
EOS ABS: 0.1 10*3/uL (ref 0.0–0.5)
EOS PCT: 2 %
HCT: 41.6 % (ref 34.8–46.6)
Hemoglobin: 13.8 g/dL (ref 11.6–15.9)
LYMPHS PCT: 26 %
Lymphs Abs: 1.9 10*3/uL (ref 0.9–3.3)
MCH: 28.3 pg (ref 25.1–34.0)
MCHC: 33.2 g/dL (ref 31.5–36.0)
MCV: 85.2 fL (ref 79.5–101.0)
MONO ABS: 0.8 10*3/uL (ref 0.1–0.9)
Monocytes Relative: 11 %
Neutro Abs: 4.3 10*3/uL (ref 1.5–6.5)
Neutrophils Relative %: 60 %
PLATELETS: 238 10*3/uL (ref 145–400)
RBC: 4.88 MIL/uL (ref 3.70–5.45)
RDW: 13.2 % (ref 11.2–14.5)
WBC Count: 7.1 10*3/uL (ref 3.9–10.3)

## 2018-05-29 ENCOUNTER — Other Ambulatory Visit: Payer: Self-pay | Admitting: Oncology

## 2018-05-29 ENCOUNTER — Institutional Professional Consult (permissible substitution): Payer: BLUE CROSS/BLUE SHIELD | Admitting: Cardiothoracic Surgery

## 2018-05-29 ENCOUNTER — Inpatient Hospital Stay: Payer: BLUE CROSS/BLUE SHIELD

## 2018-05-29 ENCOUNTER — Ambulatory Visit
Admission: RE | Admit: 2018-05-29 | Discharge: 2018-05-29 | Disposition: A | Discharge status: Home / Self Care | Payer: BLUE CROSS/BLUE SHIELD | Source: Ambulatory Visit | Attending: Radiation Oncology | Admitting: Radiation Oncology

## 2018-05-29 VITALS — BP 123/76 | HR 98 | Temp 97.7°F | Resp 20 | Ht 69.0 in | Wt 191.0 lb

## 2018-05-29 VITALS — BP 131/70 | HR 79 | Temp 98.2°F | Resp 17

## 2018-05-29 DIAGNOSIS — C155 Malignant neoplasm of lower third of esophagus: Secondary | ICD-10-CM | POA: Diagnosis not present

## 2018-05-29 DIAGNOSIS — Z5111 Encounter for antineoplastic chemotherapy: Secondary | ICD-10-CM | POA: Diagnosis not present

## 2018-05-29 MED ORDER — DIPHENHYDRAMINE HCL 50 MG/ML IJ SOLN
INTRAMUSCULAR | Status: AC
  Filled 2018-05-29: qty 1

## 2018-05-29 MED ORDER — SODIUM CHLORIDE 0.9 % IV SOLN
Freq: Once | INTRAVENOUS | Status: AC
  Administered 2018-05-29: 08:00:00 via INTRAVENOUS
  Filled 2018-05-29: qty 250

## 2018-05-29 MED ORDER — PALONOSETRON HCL INJECTION 0.25 MG/5ML
INTRAVENOUS | Status: AC
  Filled 2018-05-29: qty 5

## 2018-05-29 MED ORDER — SODIUM CHLORIDE 0.9 % IV SOLN
250.0000 mg | Freq: Once | INTRAVENOUS | Status: AC
  Administered 2018-05-29: 250 mg via INTRAVENOUS
  Filled 2018-05-29: qty 25

## 2018-05-29 MED ORDER — PALONOSETRON HCL INJECTION 0.25 MG/5ML
0.2500 mg | Freq: Once | INTRAVENOUS | Status: AC
  Administered 2018-05-29: 0.25 mg via INTRAVENOUS

## 2018-05-29 MED ORDER — SODIUM CHLORIDE 0.9 % IV SOLN
20.0000 mg | Freq: Once | INTRAVENOUS | Status: AC
  Administered 2018-05-29: 20 mg via INTRAVENOUS
  Filled 2018-05-29: qty 2

## 2018-05-29 MED ORDER — SODIUM CHLORIDE 0.9 % IV SOLN
50.0000 mg/m2 | Freq: Once | INTRAVENOUS | Status: AC
  Administered 2018-05-29: 102 mg via INTRAVENOUS
  Filled 2018-05-29: qty 17

## 2018-05-29 MED ORDER — FAMOTIDINE IN NACL 20-0.9 MG/50ML-% IV SOLN
20.0000 mg | Freq: Once | INTRAVENOUS | Status: AC
  Administered 2018-05-29: 20 mg via INTRAVENOUS

## 2018-05-29 MED ORDER — FAMOTIDINE IN NACL 20-0.9 MG/50ML-% IV SOLN
INTRAVENOUS | Status: AC
  Filled 2018-05-29: qty 50

## 2018-05-29 MED ORDER — DIPHENHYDRAMINE HCL 50 MG/ML IJ SOLN
50.0000 mg | Freq: Once | INTRAMUSCULAR | Status: AC
  Administered 2018-05-29: 50 mg via INTRAVENOUS

## 2018-05-29 NOTE — Patient Instructions (Signed)
[facility] Discharge Instructions for Patients Receiving Chemotherapy  Today you received the following chemotherapy agents: Taxol, Carbo  To help prevent nausea and vomiting after your treatment, we encourage you to take your nausea medication as directed.   If you develop nausea and vomiting that is not controlled by your nausea medication, call the clinic.   BELOW ARE SYMPTOMS THAT SHOULD BE REPORTED IMMEDIATELY:  *FEVER GREATER THAN 100.5 F  *CHILLS WITH OR WITHOUT FEVER  NAUSEA AND VOMITING THAT IS NOT CONTROLLED WITH YOUR NAUSEA MEDICATION  *UNUSUAL SHORTNESS OF BREATH  *UNUSUAL BRUISING OR BLEEDING  TENDERNESS IN MOUTH AND THROAT WITH OR WITHOUT PRESENCE OF ULCERS  *URINARY PROBLEMS  *BOWEL PROBLEMS  UNUSUAL RASH Items with * indicate a potential emergency and should be followed up as soon as possible.  Feel free to call the clinic should you have any questions or concerns. The clinic phone number is [phone].  Please show the [facility] at check-in to the Emergency Department and triage nurse.  Paclitaxel injection What is this medicine? PACLITAXEL (PAK li TAX el) is a chemotherapy drug. It targets fast dividing cells, like cancer cells, and causes these cells to die. This medicine is used to treat ovarian cancer, breast cancer, and other cancers. This medicine may be used for other purposes; ask your health care provider or pharmacist if you have questions. COMMON BRAND NAME(S): Onxol, Taxol What should I tell my health care provider before I take this medicine? They need to know if you have any of these conditions: -blood disorders -irregular heartbeat -infection (especially a virus infection such as chickenpox, cold sores, or herpes) -liver disease -previous or ongoing radiation therapy -an unusual or allergic reaction to paclitaxel, alcohol, polyoxyethylated castor oil, other chemotherapy agents, other medicines, foods, dyes, or  preservatives -pregnant or trying to get pregnant -breast-feeding How should I use this medicine? This drug is given as an infusion into a vein. It is administered in a hospital or clinic by a specially trained health care professional. Talk to your pediatrician regarding the use of this medicine in children. Special care may be needed. Overdosage: If you think you have taken too much of this medicine contact a poison control center or emergency room at once. NOTE: This medicine is only for you. Do not share this medicine with others. What if I miss a dose? It is important not to miss your dose. Call your doctor or health care professional if you are unable to keep an appointment. What may interact with this medicine? Do not take this medicine with any of the following medications: -disulfiram -metronidazole This medicine may also interact with the following medications: -cyclosporine -diazepam -ketoconazole -medicines to increase blood counts like filgrastim, pegfilgrastim, sargramostim -other chemotherapy drugs like cisplatin, doxorubicin, epirubicin, etoposide, teniposide, vincristine -quinidine -testosterone -vaccines -verapamil Talk to your doctor or health care professional before taking any of these medicines: -acetaminophen -aspirin -ibuprofen -ketoprofen -naproxen This list may not describe all possible interactions. Give your health care provider a list of all the medicines, herbs, non-prescription drugs, or dietary supplements you use. Also tell them if you smoke, drink alcohol, or use illegal drugs. Some items may interact with your medicine. What should I watch for while using this medicine? Your condition will be monitored carefully while you are receiving this medicine. You will need important blood work done while you are taking this medicine. This medicine can cause serious allergic reactions. To reduce your risk you will need to  take other medicine(s) before  treatment with this medicine. If you experience allergic reactions like skin rash, itching or hives, swelling of the face, lips, or tongue, tell your doctor or health care professional right away. In some cases, you may be given additional medicines to help with side effects. Follow all directions for their use. This drug may make you feel generally unwell. This is not uncommon, as chemotherapy can affect healthy cells as well as cancer cells. Report any side effects. Continue your course of treatment even though you feel ill unless your doctor tells you to stop. Call your doctor or health care professional for advice if you get a fever, chills or sore throat, or other symptoms of a cold or flu. Do not treat yourself. This drug decreases your body's ability to fight infections. Try to avoid being around people who are sick. This medicine may increase your risk to bruise or bleed. Call your doctor or health care professional if you notice any unusual bleeding. Be careful brushing and flossing your teeth or using a toothpick because you may get an infection or bleed more easily. If you have any dental work done, tell your dentist you are receiving this medicine. Avoid taking products that contain aspirin, acetaminophen, ibuprofen, naproxen, or ketoprofen unless instructed by your doctor. These medicines may hide a fever. Do not become pregnant while taking this medicine. Women should inform their doctor if they wish to become pregnant or think they might be pregnant. There is a potential for serious side effects to an unborn child. Talk to your health care professional or pharmacist for more information. Do not breast-feed an infant while taking this medicine. Men are advised not to father a child while receiving this medicine. This product may contain alcohol. Ask your pharmacist or healthcare provider if this medicine contains alcohol. Be sure to tell all healthcare providers you are taking this medicine.  Certain medicines, like metronidazole and disulfiram, can cause an unpleasant reaction when taken with alcohol. The reaction includes flushing, headache, nausea, vomiting, sweating, and increased thirst. The reaction can last from 30 minutes to several hours. What side effects may I notice from receiving this medicine? Side effects that you should report to your doctor or health care professional as soon as possible: -allergic reactions like skin rash, itching or hives, swelling of the face, lips, or tongue -low blood counts - This drug may decrease the number of white blood cells, red blood cells and platelets. You may be at increased risk for infections and bleeding. -signs of infection - fever or chills, cough, sore throat, pain or difficulty passing urine -signs of decreased platelets or bleeding - bruising, pinpoint red spots on the skin, black, tarry stools, nosebleeds -signs of decreased red blood cells - unusually weak or tired, fainting spells, lightheadedness -breathing problems -chest pain -high or low blood pressure -mouth sores -nausea and vomiting -pain, swelling, redness or irritation at the injection site -pain, tingling, numbness in the hands or feet -slow or irregular heartbeat -swelling of the ankle, feet, hands Side effects that usually do not require medical attention (report to your doctor or health care professional if they continue or are bothersome): -bone pain -complete hair loss including hair on your head, underarms, pubic hair, eyebrows, and eyelashes -changes in the color of fingernails -diarrhea -loosening of the fingernails -loss of appetite -muscle or joint pain -red flush to skin -sweating This list may not describe all possible side effects. Call your doctor for medical advice about   side effects. You may report side effects to FDA at 1-800-FDA-1088. Where should I keep my medicine? This drug is given in a hospital or clinic and will not be stored at  home. NOTE: This sheet is a summary. It may not cover all possible information. If you have questions about this medicine, talk to your doctor, pharmacist, or health care provider.  2018 [employer] Standard (2015-06-30 19:58:00)  Carboplatin injection What is this medicine? CARBOPLATIN (KAR boe pla tin) is a chemotherapy drug. It targets fast dividing cells, like cancer cells, and causes these cells to die. This medicine is used to treat ovarian cancer and many other cancers. This medicine may be used for other purposes; ask your health care provider or pharmacist if you have questions. COMMON BRAND NAME(S): Paraplatin What should I tell my health care provider before I take this medicine? They need to know if you have any of these conditions: -blood disorders -hearing problems -kidney disease -recent or ongoing radiation therapy -an unusual or allergic reaction to carboplatin, cisplatin, other chemotherapy, other medicines, foods, dyes, or preservatives -pregnant or trying to get pregnant -breast-feeding How should I use this medicine? This drug is usually given as an infusion into a vein. It is administered in a hospital or clinic by a specially trained health care professional. Talk to your pediatrician regarding the use of this medicine in children. Special care may be needed. Overdosage: If you think you have taken too much of this medicine contact a poison control center or emergency room at once. NOTE: This medicine is only for you. Do not share this medicine with others. What if I miss a dose? It is important not to miss a dose. Call your doctor or health care professional if you are unable to keep an appointment. What may interact with this medicine? -medicines for seizures -medicines to increase blood counts like filgrastim, pegfilgrastim, sargramostim -some antibiotics like amikacin, gentamicin, neomycin, streptomycin, tobramycin -vaccines Talk to your doctor or health  care professional before taking any of these medicines: -acetaminophen -aspirin -ibuprofen -ketoprofen -naproxen This list may not describe all possible interactions. Give your health care provider a list of all the medicines, herbs, non-prescription drugs, or dietary supplements you use. Also tell them if you smoke, drink alcohol, or use illegal drugs. Some items may interact with your medicine. What should I watch for while using this medicine? Your condition will be monitored carefully while you are receiving this medicine. You will need important blood work done while you are taking this medicine. This drug may make you feel generally unwell. This is not uncommon, as chemotherapy can affect healthy cells as well as cancer cells. Report any side effects. Continue your course of treatment even though you feel ill unless your doctor tells you to stop. In some cases, you may be given additional medicines to help with side effects. Follow all directions for their use. Call your doctor or health care professional for advice if you get a fever, chills or sore throat, or other symptoms of a cold or flu. Do not treat yourself. This drug decreases your body's ability to fight infections. Try to avoid being around people who are sick. This medicine may increase your risk to bruise or bleed. Call your doctor or health care professional if you notice any unusual bleeding. Be careful brushing and flossing your teeth or using a toothpick because you may get an infection or bleed more easily. If you have any dental work done, tell your dentist you  are receiving this medicine. Avoid taking products that contain aspirin, acetaminophen, ibuprofen, naproxen, or ketoprofen unless instructed by your doctor. These medicines may hide a fever. Do not become pregnant while taking this medicine. Women should inform their doctor if they wish to become pregnant or think they might be pregnant. There is a potential for serious  side effects to an unborn child. Talk to your health care professional or pharmacist for more information. Do not breast-feed an infant while taking this medicine. What side effects may I notice from receiving this medicine? Side effects that you should report to your doctor or health care professional as soon as possible: -allergic reactions like skin rash, itching or hives, swelling of the face, lips, or tongue -signs of infection - fever or chills, cough, sore throat, pain or difficulty passing urine -signs of decreased platelets or bleeding - bruising, pinpoint red spots on the skin, black, tarry stools, nosebleeds -signs of decreased red blood cells - unusually weak or tired, fainting spells, lightheadedness -breathing problems -changes in hearing -changes in vision -chest pain -high blood pressure -low blood counts - This drug may decrease the number of white blood cells, red blood cells and platelets. You may be at increased risk for infections and bleeding. -nausea and vomiting -pain, swelling, redness or irritation at the injection site -pain, tingling, numbness in the hands or feet -problems with balance, talking, walking -trouble passing urine or change in the amount of urine Side effects that usually do not require medical attention (report to your doctor or health care professional if they continue or are bothersome): -hair loss -loss of appetite -metallic taste in the mouth or changes in taste This list may not describe all possible side effects. Call your doctor for medical advice about side effects. You may report side effects to FDA at 1-800-FDA-1088. Where should I keep my medicine? This drug is given in a hospital or clinic and will not be stored at home. NOTE: This sheet is a summary. It may not cover all possible information. If you have questions about this medicine, talk to your doctor, pharmacist, or health care provider.  2018 [employer] Standard (2007-12-04  14:38:05)

## 2018-05-29 NOTE — Progress Notes (Signed)
[address]       [place] [address]             [phone]                    Vincentina Grogan [facility] Medical Record #[id number] Date of Birth: 08-Jul-1966  Referring: Ladell Pier, MD and Dr Kyung Rudd Primary Care: Avon Gully, NP Primary Cardiologist: No primary care provider on file.  Chief Complaint:    Chief Complaint  Patient presents with  . Esophageal Cancer    Surgical eval, PET Scan 05/18/18, Chest/ABD CT 05/04/18, ESOPHOGRAM / BARIUM SWALLOW  04/06/18, Upper EUS 05/11/18    History of Present Illness:    Rhonda Newton 52 y.o. 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  Patient noted there were times in 2018 when she had increasing symptoms of reflux .  She noted that she modified her diet and the symptoms resolved and 6 months ago she began having difficulty swallowing which was gradual in, nature but describe one particular incident of having trouble with a pancake sticking.  Following the symptoms she underwent upper GI series and endoscopy demonstrating evidence of distal esophageal cancer.  Work-up is included endoscopy and esophageal ultrasound CT scan of the abdomen and chest and PET scan.  She is started on 28 radiation treatments to be completed by October 23, and weekly chemo to be completed by October 15.    Current Activity/ Functional Status:  Patient is independent with mobility/ambulation, transfers, ADL's, IADL's.   Zubrod Score: At the time of surgery this patient's most appropriate activity status/level should be described as: '[x]'     0    Normal activity, no symptoms '[]'     1    Restricted in physical strenuous activity but ambulatory, able to do out light work '[]'     2    Ambulatory and capable of self care, unable to do work activities, up and about               >50 % of waking hours                              '[]'     3    Only limited self care, in bed  greater than 50% of waking hours '[]'     4    Completely disabled, no self care, confined to bed or chair '[]'     5    Moribund   Past Medical History:  Diagnosis Date  . Anxiety   . Anxiety and depression   . Barrett's esophagus 05/18/2018  . Depression   . 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 cataracts Bilateral    age 42  . INTRAUTERINE DEVICE INSERTION     Paraguard    Family History  Problem Relation Age of Onset  . Barrett's esophagus Mother   . Hypertension Mother   . Breast cancer Mother   . Breast cancer Sister   . Breast cancer Maternal Aunt   . Breast cancer Paternal Aunt    Family history is significant for a mother and the mother's identical twin sister with both breast cancer and dysplasia and Barrett's esophagus, she has a great grandmother who had died of esophageal cancer, her sister has breast cancer  Social History   Tobacco Use  Smoking Status Current Every Day Smoker  . Packs/day: 1.00  . Types: Cigarettes  Smokeless Tobacco Never Used    Patient previously smoked for approximately 30 years she quit on Labor Day this year   Social History   Substance and Sexual Activity  Alcohol Use No   Comment: occasional     Allergies  Allergen Reactions  . Benzodiazepines     Clouding of cognition and disinhibition    Current Outpatient Medications  Medication Sig Dispense Refill  . dexamethasone (DECADRON) 4 MG tablet Take 10 mg (2.5 tabs) 10 pm the night before & 6 am the morning of the first chemo 6 tablet 0  . ibuprofen (ADVIL,MOTRIN) 200 MG tablet Take 200 mg by mouth every 6 (six) hours as needed.    Marland Kitchen omeprazole (PRILOSEC) 20 MG capsule     . prochlorperazine (COMPAZINE) 10 MG tablet Take 1 tablet (10 mg total) by mouth every 6 (six) hours as needed for nausea or vomiting. 30 tablet 2  . SUMAtriptan (IMITREX) 25 MG tablet Take 25 mg by mouth every 2 (two) hours as needed for migraine.  May repeat in 2 hours if headache persists or recurs.     No current facility-administered medications for this visit.     Pertinent items are noted in HPI.   Review of Systems:     Cardiac Review of Systems: [Y] = yes  or   [ N ] = no   Chest Pain [ n ]  Resting SOB [n   ] Exertional SOB  [n  ]  Orthopnea Florencio.Farrier  ]   Pedal Edema [  n ]    Palpitations [n  ] Syncope  Florencio.Farrier  ]   Presyncope [ n  ]   General Review of Systems: [Y] = yes [  ]=no Constitional: recent weight change [  n];  Wt loss over the last 3 months [   ] anorexia Blue.Reese  ]; fatigue [  ]; nausea Blue.Reese  ]; night sweats [  ]; fever [  ]; or chills [  ];           Eye : blurred vision [  ]; diplopia [   ]; vision changes [  ];  Amaurosis fugax[  ]; Resp: cough [  ];  wheezing[  ];  hemoptysis[  ]; shortness of breath[  ]; paroxysmal nocturnal dyspnea[  ]; dyspnea on exertion[  ]; or orthopnea[  ];  GI:  gallstones[  ], vomiting[y  ];  dysphagia[  ]; melena[  ];  hematochezia [  ]; heartburn[  ];   Hx of  Colonoscopy[  y]; GU: kidney stones [  ]; hematuria[  ];   dysuria [  ];  nocturia[  ];  history of     obstruction [  ]; urinary frequency [  ]             Skin: rash, swelling[  ];, hair loss[  ];  peripheral edema[  ];  or itching[  ]; Musculosketetal: myalgias[  ];  joint swelling[  ];  joint erythema[  ];  joint pain[  ];  back pain[  ];  Heme/Lymph: bruising[  ];  bleeding[  ];  anemia[  ];  Neuro: TIA[  ];  headaches[  ];  stroke[  ];  vertigo[  ];  seizures[  ];   paresthesias[  ];  difficulty walking[  ];  Psych:depression[  ]; anxiety[  ];  Endocrine: diabetes[  ];  thyroid dysfunction[  ];  Immunizations: Flu up to date [  ]; Pneumococcal up to date [  ];  Other:      PHYSICAL EXAMINATION: BP 123/76   Pulse 98   Temp 97.7 F (36.5 C) (Oral)   Resp 20   Ht '5\' 9"'  (1.753 m)   Wt 191 lb (86.6 kg)   SpO2 98% Comment: RA  BMI 28.21 kg/m  General appearance: alert, cooperative, appears stated age and no distress Head:  Normocephalic, without obvious abnormality, atraumatic Neck: no adenopathy, no carotid bruit, no JVD, supple, symmetrical, trachea midline and thyroid not enlarged, symmetric, no tenderness/mass/nodules Lymph nodes: Cervical, supraclavicular, and axillary nodes normal. Resp: clear to auscultation bilaterally Back: symmetric, no curvature. ROM normal. No CVA tenderness. Cardio: regular rate and rhythm, S1, S2 normal, no murmur, click, rub or gallop GI: soft, non-tender; bowel sounds normal; no masses,  no organomegaly Extremities: extremities normal, atraumatic, no cyanosis or edema and Homans sign is negative, no sign of DVT Neurologic: Grossly normal  Diagnostic Studies & Laboratory data:     Recent Radiology Findings:   Ct Abdomen W Contrast  Result Date: 05/04/2018 CLINICAL DATA:  Evaluate for esophageal mass. Dysphasia with solid foods. EXAM: CT CHEST AND ABDOMEN WITH CONTRAST TECHNIQUE: Multidetector CT imaging of the chest and abdomen was performed following the standard protocol during bolus administration of intravenous contrast. CONTRAST:  136m ISOVUE-300 IOPAMIDOL (ISOVUE-300) INJECTION 61% COMPARISON:  Esophagram 04/06/2018 FINDINGS: CT CHEST FINDINGS Cardiovascular: Normal heart size.  No pericardial effusion. Mediastinum/Nodes: Normal appearance of the thyroid gland. No enlarged axillary or supraclavicular adenopathy. No mediastinal adenopathy identified. The trachea appears patent and is midline. Circumferential mass involving the distal esophagus measures 3.1 by 3.2 by 5.9 cm. The mass extends to just below the level of the GE junction. Lungs/Pleura: No pleural effusion identified. No airspace consolidation, atelectasis or pneumothorax. Small nodule in the right upper lobe is nonspecific measuring 4 mm, image 22/4. Musculoskeletal: No chest wall mass or suspicious bone lesions identified. CT ABDOMEN FINDINGS Hepatobiliary: No focal liver abnormality is seen. No gallstones, gallbladder  wall thickening, or biliary dilatation. Pancreas: Unremarkable. No pancreatic ductal dilatation or surrounding inflammatory changes. Spleen: Normal in size without focal abnormality. Adrenals/Urinary Tract: Normal adrenal glands. Normal appearance of the right kidney. Small cyst in the upper pole of left kidney measures 7 mm. Stomach/Bowel: Distal esophageal mass protrudes into the proximal stomach just below the GE junction, image number 8/6. the visualized small and large bowel loops are unremarkable. Vascular/Lymphatic: Normal appearance of the abdominal aorta. No enlarged abdominal lymph nodes. Other: No free fluid or fluid collections. Musculoskeletal: No acute or significant osseous findings. IMPRESSION: 1. Distal esophageal mass is identified measuring approximately 5.9 cm and extends just below the level of the GE junction. Findings concerning for esophageal carcinoma. 2. No findings to suggest metastatic adenopathy or distant metastatic disease within the chest or abdomen. 3. 4 mm right upper lobe lung nodule noted.  Nonspecific. Electronically Signed   By: TKerby MoorsM.D.   On: 05/04/2018 15:24   Nm Pet Image Initial (pi) Skull Base To Thigh  Result Date: 05/18/2018 CLINICAL DATA:  Initial treatment strategy for esophageal carcinoma. EXAM: NUCLEAR MEDICINE PET SKULL BASE TO THIGH TECHNIQUE: 10.6 mCi F-18 FDG was injected intravenously. Full-ring PET imaging was performed from the skull base to thigh after the radiotracer. CT data was obtained and used for attenuation correction and anatomic localization. Fasting blood glucose: 86 mg/dl COMPARISON:  6 CT 05/04/2018 FINDINGS: Mediastinal blood pool activity: SUV max 2.3 NECK: No hypermetabolic lymph nodes in the neck. Incidental CT findings: none CHEST: Intense metabolic activity associated with the distal esophagus leading up to GE junction. There is masslike thickening through distal esophagus. There is intense metabolic activity associated with  this thickening with SUV max equal 10.6. The hypermetabolic lesion extends over 6 cm leading up to the GE junction. No hypermetabolic mediastinal lymph nodes. Small 4 mm upper lobe pulmonary nodule on the RIGHT (66/3). No metabolic activity associated this small lesion. Incidental CT findings: none ABDOMEN/PELVIS: No evidence of liver metastasis. No hypermetabolic gastrohepatic ligament lymph nodes. No hypermetabolic upper abdominal or periportal lymph nodes. No pelvic lymphadenopathy. Incidental CT findings: Uterus and ovaries normal. IUD in expected location. SKELETON: No focal hypermetabolic activity to suggest skeletal metastasis. Incidental CT findings: none IMPRESSION: 1. Hypermetabolic mass in distal esophagus consistent primary esophageal carcinoma. Mass extends over 6 cm segment. 2. No evidence of metastatic lymphadenopathy. 3. No evidence of solid organ disease or distant metastatic disease. 4. Small RIGHT upper lobe pulmonary nodule. Recommend attention on follow-up. Favored benign. Electronically Signed   By: Suzy Bouchard M.D.   On: 05/18/2018 15:47     I have independently reviewed the above radiology studies  and reviewed the findings with the patient.   Recent Lab Findings: Lab Results  Component Value Date   WBC 7.1 05/28/2018   HGB 13.8 05/28/2018   HCT 41.6 05/28/2018   PLT 238 05/28/2018   GLUCOSE 96 05/28/2018   ALT 19 05/28/2018   AST 16 05/28/2018   NA 139 05/28/2018   K 4.1 05/28/2018   CL 103 05/28/2018   CREATININE 0.90 05/28/2018   BUN 19 05/28/2018   CO2 28 05/28/2018   TSH 0.720 02/10/2012    She was taken to an upper endoscopy 05/03/2018.  The lumen of the esophagus was mildly dilated.  A large ulcerated mass with bleeding was found at the GE junction from 34-38 cm from the incisors.  Biopsies were obtained.  Erosions without bleeding were found in the duodenum.  Biopsies were obtained.  The stomach appeared normal.  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  The duodenal biopsy returned as chronic peptic duodenitis with erosion  Esophageal ultrasound done May 11, 2018 by Dr. [facility], findings note a large fungating ulcerating mass found in the distal esophagus about 34 to 38 cm from the incisors, tumor extended to the GE junction.  The mass was partially obstructing and circumferential the EUS scope was able to pass into the stomach there was no endoscopic abnormality in the pancreas or the left lobe of the liver mass was noted at the GE junction that was circumferential the borders were poorly defined there was sonographic evidence suggesting invasion into the adventitia one lymph node was visualized with the ultrasound probe in the lower esophageal mediastinum AL there is no endoscopic abnormality involving the celiac trunk.  Assessment / Plan:    Cancer Staging Malignant neoplasm of lower third of esophagus (HCC) Staging form: Esophagus - Adenocarcinoma, AJCC 8th Edition - Clinical: Stage III (cT3, cN1, cM0, G3) - Signed by [name], MD on 05/29/2018    Adenocarcinoma of the distal esophagus involving Barrett's, involving 1 node by ultrasound and invading to the adventitia.  Agree with proceeding with preoperative radiation and chemotherapy, and have recommended to the patient that we then plan to proceed  with surgical resection 6 to 8 weeks after completion of radiation.  I will plan to see her back near the end of the time of her radiation, will then plan on restaging CT scan and tentative esophageal resection in early December.  The patient's questions were answered, she was given printed material concerning esophageal resection.     I  spent 60 minutes with  the patient face to face and greater then 50% of the time was spent in counseling and coordination of care.    [name] MD      [address] [place] [address] Office [phone]   Beeper [phone]  05/29/2018 5:19 PM

## 2018-05-30 ENCOUNTER — Ambulatory Visit
Admission: RE | Admit: 2018-05-30 | Discharge: 2018-05-30 | Disposition: A | Discharge status: Home / Self Care | Payer: BLUE CROSS/BLUE SHIELD | Source: Ambulatory Visit | Attending: Radiation Oncology | Admitting: Radiation Oncology

## 2018-05-30 DIAGNOSIS — C155 Malignant neoplasm of lower third of esophagus: Secondary | ICD-10-CM | POA: Diagnosis not present

## 2018-05-30 MED ORDER — SONAFINE EX EMUL
1.0000 "application " | Freq: Two times a day (BID) | CUTANEOUS | Status: DC
  Administered 2018-05-30: 1 via TOPICAL

## 2018-05-30 NOTE — Progress Notes (Signed)
Pt here for patient teaching.  Pt given Radiation and You booklet, skin care instructions and Sonafine.  Reviewed areas of pertinence such as fatigue, mouth changes, skin changes, throat changes, cough and shortness of breath . Pt able to give teach back of to pat skin and use unscented/gentle soap,apply Sonafine bid and avoid applying anything to skin within 4 hours of treatment. Pt demonstrated understanding, needs reinforcement, no evidence of learning, refused teaching and  of information given and will contact nursing with any questions or concerns.     [email or web address]

## 2018-05-31 ENCOUNTER — Ambulatory Visit
Admission: RE | Admit: 2018-05-31 | Discharge: 2018-05-31 | Disposition: A | Discharge status: Home / Self Care | Payer: BLUE CROSS/BLUE SHIELD | Source: Ambulatory Visit | Attending: Radiation Oncology | Admitting: Radiation Oncology

## 2018-05-31 DIAGNOSIS — C155 Malignant neoplasm of lower third of esophagus: Secondary | ICD-10-CM | POA: Diagnosis not present

## 2018-06-01 ENCOUNTER — Ambulatory Visit
Admission: RE | Admit: 2018-06-01 | Discharge: 2018-06-01 | Disposition: A | Discharge status: Home / Self Care | Payer: BLUE CROSS/BLUE SHIELD | Source: Ambulatory Visit | Attending: Radiation Oncology | Admitting: Radiation Oncology

## 2018-06-01 DIAGNOSIS — C155 Malignant neoplasm of lower third of esophagus: Secondary | ICD-10-CM | POA: Diagnosis not present

## 2018-06-01 NOTE — Progress Notes (Signed)
  Radiation Oncology         [phone] ________________________________  Name: Rhonda Newton MRN: [id number]  Date: 05/22/2018  DOB: 06-04-1966  SIMULATION AND TREATMENT PLANNING NOTE  DIAGNOSIS:     ICD-10-CM   1. Malignant neoplasm of lower third of esophagus ([place]) C15.5      Site:  esophagus  NARRATIVE:  The patient was brought to the [facility].  Identity was confirmed.  All relevant records and images related to the planned course of therapy were reviewed.   Written consent to proceed with treatment was confirmed which was freely given after reviewing the details related to the planned course of therapy had been reviewed with the patient.  Then, the patient was set-up in a stable reproducible supine position for radiation therapy.  CT images were obtained.  Surface markings were placed.    Medically necessary complex treatment device(s) for immobilization:  vac-lock bag.   The CT images were loaded into the planning software.  Then the target and avoidance structures were contoured.  Treatment planning then occurred.  The radiation prescription was entered and confirmed. I have requested : Intensity Modulated Radiotherapy (IMRT) is medically necessary for this case for the following reason:  sparing of nearby critical normal structures including the spinal cord, heart, and lungs.   The patient will undergo daily image guidance to ensure accurate localization of the target, and adequate minimize dose to the normal surrounding structures in close proximity to the target.   PLAN:  The patient will receive 50 Gy in 25 fractions initially, also using a simultaneous boost technique to 45 Gy. A boost will then be given to 6 Gy/ 5.4 Gy using a SIB technique to yield 56 Gy to the high dose target and 50.4 Gy to the lower dose target.  Special treatment procedure The patient will also receive concurrent chemotherapy during the treatment. The patient may therefore  experience increased toxicity or side effects and the patient will be monitored for such problems. This may require extra lab work as necessary. This therefore constitutes a special treatment procedure.   ________________________________   Jodelle Gross, MD, PhD

## 2018-06-04 ENCOUNTER — Ambulatory Visit
Admission: RE | Admit: 2018-06-04 | Discharge: 2018-06-04 | Disposition: A | Discharge status: Home / Self Care | Payer: BLUE CROSS/BLUE SHIELD | Source: Ambulatory Visit | Attending: Radiation Oncology | Admitting: Radiation Oncology

## 2018-06-04 DIAGNOSIS — C155 Malignant neoplasm of lower third of esophagus: Secondary | ICD-10-CM | POA: Diagnosis not present

## 2018-06-05 ENCOUNTER — Inpatient Hospital Stay: Payer: BLUE CROSS/BLUE SHIELD

## 2018-06-05 ENCOUNTER — Encounter: Payer: Self-pay | Admitting: Nurse Practitioner

## 2018-06-05 ENCOUNTER — Encounter: Payer: Self-pay | Admitting: Cardiothoracic Surgery

## 2018-06-05 ENCOUNTER — Other Ambulatory Visit: Payer: Self-pay | Admitting: Oncology

## 2018-06-05 ENCOUNTER — Ambulatory Visit
Admission: RE | Admit: 2018-06-05 | Discharge: 2018-06-05 | Disposition: A | Discharge status: Home / Self Care | Payer: BLUE CROSS/BLUE SHIELD | Source: Ambulatory Visit | Attending: Radiation Oncology | Admitting: Radiation Oncology

## 2018-06-05 ENCOUNTER — Inpatient Hospital Stay: Payer: BLUE CROSS/BLUE SHIELD | Admitting: Nurse Practitioner

## 2018-06-05 VITALS — BP 121/75 | HR 75 | Temp 98.0°F | Resp 17 | Ht 69.0 in | Wt 189.3 lb

## 2018-06-05 DIAGNOSIS — R21 Rash and other nonspecific skin eruption: Secondary | ICD-10-CM

## 2018-06-05 DIAGNOSIS — C160 Malignant neoplasm of cardia: Secondary | ICD-10-CM

## 2018-06-05 DIAGNOSIS — C155 Malignant neoplasm of lower third of esophagus: Secondary | ICD-10-CM

## 2018-06-05 DIAGNOSIS — Z5111 Encounter for antineoplastic chemotherapy: Secondary | ICD-10-CM | POA: Diagnosis not present

## 2018-06-05 DIAGNOSIS — Z720 Tobacco use: Secondary | ICD-10-CM | POA: Diagnosis not present

## 2018-06-05 DIAGNOSIS — Z803 Family history of malignant neoplasm of breast: Secondary | ICD-10-CM

## 2018-06-05 LAB — CBC WITH DIFFERENTIAL (CANCER CENTER ONLY)
Basophils Absolute: 0 10*3/uL (ref 0.0–0.1)
Basophils Relative: 1 %
Eosinophils Absolute: 0.1 10*3/uL (ref 0.0–0.5)
Eosinophils Relative: 2 %
HCT: 42 % (ref 34.8–46.6)
HEMOGLOBIN: 13.9 g/dL (ref 11.6–15.9)
Lymphocytes Relative: 9 %
Lymphs Abs: 0.5 10*3/uL — ABNORMAL LOW (ref 0.9–3.3)
MCH: 27.9 pg (ref 25.1–34.0)
MCHC: 33.1 g/dL (ref 31.5–36.0)
MCV: 84.4 fL (ref 79.5–101.0)
MONO ABS: 0.3 10*3/uL (ref 0.1–0.9)
MONOS PCT: 6 %
Neutro Abs: 4.4 10*3/uL (ref 1.5–6.5)
Neutrophils Relative %: 82 %
Platelet Count: 253 10*3/uL (ref 145–400)
RBC: 4.98 MIL/uL (ref 3.70–5.45)
RDW: 13.3 % (ref 11.2–14.5)
WBC Count: 5.4 10*3/uL (ref 3.9–10.3)

## 2018-06-05 LAB — CMP (CANCER CENTER ONLY)
ALK PHOS: 81 U/L (ref 38–126)
ALT: 13 U/L (ref 0–44)
AST: 11 U/L — ABNORMAL LOW (ref 15–41)
Albumin: 4 g/dL (ref 3.5–5.0)
Anion gap: 9 (ref 5–15)
BUN: 16 mg/dL (ref 6–20)
CALCIUM: 9.5 mg/dL (ref 8.9–10.3)
CO2: 27 mmol/L (ref 22–32)
CREATININE: 0.84 mg/dL (ref 0.44–1.00)
Chloride: 105 mmol/L (ref 98–111)
GFR, Est AFR Am: >60 mL/min (ref >60)
Glucose, Bld: 91 mg/dL (ref 70–99)
Potassium: 4.1 mmol/L (ref 3.5–5.1)
SODIUM: 141 mmol/L (ref 135–145)
Total Bilirubin: 0.7 mg/dL (ref 0.3–1.2)
Total Protein: 7.2 g/dL (ref 6.5–8.1)

## 2018-06-05 MED ORDER — OMEPRAZOLE 20 MG PO CPDR: 20.0000 mg | DELAYED_RELEASE_CAPSULE | Freq: Two times a day (BID) | ORAL | 0 refills | Status: DC

## 2018-06-05 NOTE — Progress Notes (Signed)
  Rhonda Newton OFFICE PROGRESS NOTE   Diagnosis:  GE junction carcinoma  INTERVAL HISTORY:   Rhonda Newton returns as scheduled.  She began radiation 05/28/2018.  She completed cycle 1 weekly carboplatin/Taxol 05/29/2018.  She denies signs of allergic reaction.  She denies nausea/vomiting.  No mouth sores.  She notes her mouth is periodically "dry".  She had loose stools for 1 day.  She also noted increased frequency of urination for 1 day.  She has increased fatigue.  On 06/02/2018 she noted pruritus and a rash at the left wrist.  Yesterday morning she noted a rash at the left foot/ankle in an area of recent minor trauma.  She reports similar rashes in the past prior to beginning chemotherapy.  No dysphagia.  She is overall tolerating a regular diet with a few modifications.  No numbness or tingling in her hands or feet.  Objective:  Vital signs in last 24 hours:  Blood pressure 121/75, pulse 75, temperature 98 F (36.7 C), temperature source Oral, resp. rate 17, height '5\' 9"'$  (1.753 m), weight 189 lb 4.8 oz (85.9 kg), SpO2 100 %.    HEENT: No thrush or ulcers. Resp: Lungs clear bilaterally. Cardio: Regular rate and rhythm. GI: Abdomen soft and nontender.  No hepatomegaly. Vascular: No leg edema. Neuro: Alert and oriented. Skin: Small areas of petechial appearing rash at the left wrist/ankle.   Lab Results:  Lab Results  Component Value Date   WBC 7.1 05/28/2018   HGB 13.8 05/28/2018   HCT 41.6 05/28/2018   MCV 85.2 05/28/2018   PLT 238 05/28/2018   NEUTROABS 4.3 05/28/2018    Imaging:  No results found.  Medications: I have reviewed the patient's current medications.  Assessment/Plan: 1. Gastroesophageal carcinoma ? 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 CTs 05/04/2018, distal esophagus mass extending to  to below the GE junction, no evidence of metastatic disease, 4 mm nonspecific right upper lobe nodule ? Upper EUS 05/11/2018- large fungating ulcerating mass in the distal esophagus about 34 to 38 cm from the incisors, tumor extends to the GE junction; staged T3 N1 MX ? Radiation initiated 05/28/2018 ? Cycle 1 weekly carboplatin/Taxol 05/29/2018 ? Cycle 2 weekly carboplatin/Taxol 06/06/2018 2. Migraine headaches 3. Multiple family members with breast cancer 4. Tobacco use  Disposition: Rhonda Newton appears stable.  She continues radiation.  She tolerated cycle 1 weekly carboplatin/Taxol well.  We reviewed the CBC from today.  Counts are adequate for treatment.  Plan to proceed with cycle 2 as scheduled 06/06/2018.  She has a petechial appearing rash at the left wrist and ankle.  She has had a similar rash in the past predating treatment here.  Platelet count is in normal range.  She will contact the office if the rash worsens.  She will return for the third weekly chemotherapy 06/12/2018.  We will see her in follow-up in 2 weeks.  She will contact the office in the interim with any problems.    Ned Card ANP/GNP-BC   06/05/2018  9:21 AM

## 2018-06-05 NOTE — Progress Notes (Signed)
Nutrition Follow-up:  Patient with adenocarcinoma of GE junction.  Patient started chemotherapy on 9/17 and radiation on 9/16.    Met with patient in clinic this am.  Patient reports biggest issue has been fatigue.  No nausea with chemotherapy.  Patient reports that she is eating regular consistency foods, trying to chew foods well add gravy, sauce to foods.  Drinking about 1 ensure enlive per day.    Medications: reviewed  Labs: reviewed  Anthropometrics:   Weight decreased slightly to 189 lb 4.8 oz today from 190 lb on 9/3  NUTRITION DIAGNOSIS: Inadequate oral intake stable   MALNUTRITION DIAGNOSIS: continue to monitor   INTERVENTION:  Discussed importance of continuing solid foods focusing on good calories and protein.   Provided patient with 2nd case of ensure enlive today.      MONITORING, EVALUATION, GOAL: Patient will consume adequate calories and protein during treatment to maintain weight and nutrition   NEXT VISIT: Tuesday, October 1 during treatment  Alysia Scism B. Zenia Resides, [place], [place] Registered Dietitian [phone] (pager)

## 2018-06-06 ENCOUNTER — Ambulatory Visit
Admission: RE | Admit: 2018-06-06 | Discharge: 2018-06-06 | Disposition: A | Discharge status: Home / Self Care | Payer: BLUE CROSS/BLUE SHIELD | Source: Ambulatory Visit | Attending: Radiation Oncology | Admitting: Radiation Oncology

## 2018-06-06 ENCOUNTER — Inpatient Hospital Stay: Payer: BLUE CROSS/BLUE SHIELD

## 2018-06-06 VITALS — BP 119/69 | HR 74 | Temp 98.0°F | Resp 18

## 2018-06-06 DIAGNOSIS — C155 Malignant neoplasm of lower third of esophagus: Secondary | ICD-10-CM

## 2018-06-06 DIAGNOSIS — Z5111 Encounter for antineoplastic chemotherapy: Secondary | ICD-10-CM | POA: Diagnosis not present

## 2018-06-06 MED ORDER — INFLUENZA VAC SPLIT QUAD 0.5 ML IM SUSY
PREFILLED_SYRINGE | INTRAMUSCULAR | Status: AC
  Filled 2018-06-06: qty 0.5

## 2018-06-06 MED ORDER — SODIUM CHLORIDE 0.9 % IV SOLN: 10.0000 mg | Freq: Once | INTRAVENOUS | Status: DC

## 2018-06-06 MED ORDER — DEXAMETHASONE SODIUM PHOSPHATE 10 MG/ML IJ SOLN
INTRAMUSCULAR | Status: AC
  Filled 2018-06-06: qty 1

## 2018-06-06 MED ORDER — FAMOTIDINE IN NACL 20-0.9 MG/50ML-% IV SOLN
INTRAVENOUS | Status: AC
  Filled 2018-06-06: qty 50

## 2018-06-06 MED ORDER — SODIUM CHLORIDE 0.9 % IV SOLN
250.0000 mg | Freq: Once | INTRAVENOUS | Status: AC
  Administered 2018-06-06: 250 mg via INTRAVENOUS
  Filled 2018-06-06: qty 25

## 2018-06-06 MED ORDER — DIPHENHYDRAMINE HCL 50 MG/ML IJ SOLN
INTRAMUSCULAR | Status: AC
  Filled 2018-06-06: qty 1

## 2018-06-06 MED ORDER — SODIUM CHLORIDE 0.9 % IV SOLN
Freq: Once | INTRAVENOUS | Status: AC
  Administered 2018-06-06: 09:00:00 via INTRAVENOUS
  Filled 2018-06-06: qty 250

## 2018-06-06 MED ORDER — FAMOTIDINE IN NACL 20-0.9 MG/50ML-% IV SOLN
20.0000 mg | Freq: Once | INTRAVENOUS | Status: AC
  Administered 2018-06-06: 20 mg via INTRAVENOUS

## 2018-06-06 MED ORDER — DEXAMETHASONE SODIUM PHOSPHATE 10 MG/ML IJ SOLN
10.0000 mg | Freq: Once | INTRAMUSCULAR | Status: AC
  Administered 2018-06-06: 10 mg via INTRAVENOUS

## 2018-06-06 MED ORDER — PALONOSETRON HCL INJECTION 0.25 MG/5ML
0.2500 mg | Freq: Once | INTRAVENOUS | Status: AC
  Administered 2018-06-06: 0.25 mg via INTRAVENOUS

## 2018-06-06 MED ORDER — PALONOSETRON HCL INJECTION 0.25 MG/5ML
INTRAVENOUS | Status: AC
  Filled 2018-06-06: qty 5

## 2018-06-06 MED ORDER — INFLUENZA VAC SPLIT QUAD 0.5 ML IM SUSY
0.5000 mL | PREFILLED_SYRINGE | Freq: Once | INTRAMUSCULAR | Status: AC
  Administered 2018-06-06: 0.5 mL via INTRAMUSCULAR

## 2018-06-06 MED ORDER — SODIUM CHLORIDE 0.9 % IV SOLN
50.0000 mg/m2 | Freq: Once | INTRAVENOUS | Status: AC
  Administered 2018-06-06: 102 mg via INTRAVENOUS
  Filled 2018-06-06: qty 17

## 2018-06-06 MED ORDER — DIPHENHYDRAMINE HCL 50 MG/ML IJ SOLN
25.0000 mg | Freq: Once | INTRAMUSCULAR | Status: AC
  Administered 2018-06-06: 25 mg via INTRAVENOUS

## 2018-06-06 NOTE — Patient Instructions (Addendum)
[facility] Discharge Instructions for Patients Receiving Chemotherapy  Today you received the following chemotherapy agents: Paclitaxel (Taxol) and Carboplatin (Paraplatin)  To help prevent nausea and vomiting after your treatment, we encourage you to take your nausea medication as directed.    If you develop nausea and vomiting that is not controlled by your nausea medication, call the clinic.   BELOW ARE SYMPTOMS THAT SHOULD BE REPORTED IMMEDIATELY:  *FEVER GREATER THAN 100.5 F  *CHILLS WITH OR WITHOUT FEVER  NAUSEA AND VOMITING THAT IS NOT CONTROLLED WITH YOUR NAUSEA MEDICATION  *UNUSUAL SHORTNESS OF BREATH  *UNUSUAL BRUISING OR BLEEDING  TENDERNESS IN MOUTH AND THROAT WITH OR WITHOUT PRESENCE OF ULCERS  *URINARY PROBLEMS  *BOWEL PROBLEMS  UNUSUAL RASH Items with * indicate a potential emergency and should be followed up as soon as possible.  Feel free to call the clinic should you have any questions or concerns. The clinic phone number is [phone].  Please show the [facility] at check-in to the Emergency Department and triage nurse.  Influenza (Flu) Vaccine (Inactivated or Recombinant): What You Need to Know 1. Why get vaccinated? Influenza ("flu") is a contagious disease that spreads around the [place] every year, usually between October and May. Flu is caused by influenza viruses, and is spread mainly by coughing, sneezing, and close contact. Anyone can get flu. Flu strikes suddenly and can last several days. Symptoms vary by age, but can include:  fever/chills  sore throat  muscle aches  fatigue  cough  headache  runny or stuffy nose  Flu can also lead to pneumonia and blood infections, and cause diarrhea and seizures in children. If you have a medical condition, such as heart or lung disease, flu can make it worse. Flu is more dangerous for some people. Infants and young children, people 24 years of age and older,  pregnant women, and people with certain health conditions or a weakened immune system are at greatest risk. Each year thousands of people in the [place] die from flu, and many more are hospitalized. Flu vaccine can:  keep you from getting flu,  make flu less severe if you do get it, and  keep you from spreading flu to your family and other people. 2. Inactivated and recombinant flu vaccines A dose of flu vaccine is recommended every flu season. Children 6 months through 63 years of age may need two doses during the same flu season. Everyone else needs only one dose each flu season. Some inactivated flu vaccines contain a very small amount of a mercury-based preservative called thimerosal. Studies have not shown thimerosal in vaccines to be harmful, but flu vaccines that do not contain thimerosal are available. There is no live flu virus in flu shots. They cannot cause the flu. There are many flu viruses, and they are always changing. Each year a new flu vaccine is made to protect against three or four viruses that are likely to cause disease in the upcoming flu season. But even when the vaccine doesn't exactly match these viruses, it may still provide some protection. Flu vaccine cannot prevent:  flu that is caused by a virus not covered by the vaccine, or  illnesses that look like flu but are not.  It takes about 2 weeks for protection to develop after vaccination, and protection lasts through the flu season. 3. Some people should not get this vaccine Tell the person who is giving you the vaccine:  If you have any  severe, life-threatening allergies. If you ever had a life-threatening allergic reaction after a dose of flu vaccine, or have a severe allergy to any part of this vaccine, you may be advised not to get vaccinated. Most, but not all, types of flu vaccine contain a small amount of egg protein.  If you ever had Guillain-Barr Syndrome (also called GBS). Some people with a  history of GBS should not get this vaccine. This should be discussed with your doctor.  If you are not feeling well. It is usually okay to get flu vaccine when you have a mild illness, but you might be asked to come back when you feel better.  4. Risks of a vaccine reaction With any medicine, including vaccines, there is a chance of reactions. These are usually mild and go away on their own, but serious reactions are also possible. Most people who get a flu shot do not have any problems with it. Minor problems following a flu shot include:  soreness, redness, or swelling where the shot was given  hoarseness  sore, red or itchy eyes  cough  fever  aches  headache  itching  fatigue  If these problems occur, they usually begin soon after the shot and last 1 or 2 days. More serious problems following a flu shot can include the following:  There may be a small increased risk of Guillain-Barre Syndrome (GBS) after inactivated flu vaccine. This risk has been estimated at 1 or 2 additional cases per million people vaccinated. This is much lower than the risk of severe complications from flu, which can be prevented by flu vaccine.  Young children who get the flu shot along with pneumococcal vaccine (PCV13) and/or DTaP vaccine at the same time might be slightly more likely to have a seizure caused by fever. Ask your doctor for more information. Tell your doctor if a child who is getting flu vaccine has ever had a seizure.  Problems that could happen after any injected vaccine:  People sometimes faint after a medical procedure, including vaccination. Sitting or lying down for about 15 minutes can help prevent fainting, and injuries caused by a fall. Tell your doctor if you feel dizzy, or have vision changes or ringing in the ears.  Some people get severe pain in the shoulder and have difficulty moving the arm where a shot was given. This happens very rarely.  Any medication can cause a  severe allergic reaction. Such reactions from a vaccine are very rare, estimated at about 1 in a million doses, and would happen within a few minutes to a few hours after the vaccination. As with any medicine, there is a very remote chance of a vaccine causing a serious injury or death. The safety of vaccines is always being monitored. For more information, visit: http://www.aguilar.org/ 5. What if there is a serious reaction? What should I look for? Look for anything that concerns you, such as signs of a severe allergic reaction, very high fever, or unusual behavior. Signs of a severe allergic reaction can include hives, swelling of the face and throat, difficulty breathing, a fast heartbeat, dizziness, and weakness. These would start a few minutes to a few hours after the vaccination. What should I do?  If you think it is a severe allergic reaction or other emergency that can't wait, call 9-1-1 and get the person to the nearest hospital. Otherwise, call your doctor.  Reactions should be reported to the Vaccine Adverse Event Reporting System (VAERS). Your doctor should  file this report, or you can do it yourself through the VAERS web site at www.vaers.SamedayNews.es, or by calling (206)295-9809. ? VAERS does not give medical advice. 6. The National Vaccine Injury Compensation Program The [employer] Vaccine Injury Compensation Program (VICP) is a federal program that was created to compensate people who may have been injured by certain vaccines. Persons who believe they may have been injured by a vaccine can learn about the program and about filing a claim by calling 7314249731 or visiting the [facility] website at GoldCloset.com.ee. There is a time limit to file a claim for compensation. 7. How can I learn more?  Ask your healthcare provider. He or she can give you the vaccine package insert or suggest other sources of information.  Call your local or state health department.  Contact  the Centers for Disease Control and Prevention (CDC): ? Call 720 857 2357 (1-800-CDC-INFO) or ? Visit CDC's website at https://gibson.com/ Vaccine Information Statement, Inactivated Influenza Vaccine (04/18/2014) This information is not intended to replace advice given to you by your health care provider. Make sure you discuss any questions you have with your health care provider. Document Released: 06/23/2006 Document Revised: 05/19/2016 Document Reviewed: 05/19/2016 Elsevier Interactive Patient Education  2017 [employer].

## 2018-06-07 ENCOUNTER — Ambulatory Visit
Admission: RE | Admit: 2018-06-07 | Discharge: 2018-06-07 | Disposition: A | Discharge status: Home / Self Care | Payer: BLUE CROSS/BLUE SHIELD | Source: Ambulatory Visit | Attending: Radiation Oncology | Admitting: Radiation Oncology

## 2018-06-07 DIAGNOSIS — C155 Malignant neoplasm of lower third of esophagus: Secondary | ICD-10-CM | POA: Diagnosis not present

## 2018-06-08 ENCOUNTER — Other Ambulatory Visit: Payer: Self-pay | Admitting: Emergency Medicine

## 2018-06-08 ENCOUNTER — Ambulatory Visit
Admission: RE | Admit: 2018-06-08 | Discharge: 2018-06-08 | Disposition: A | Discharge status: Home / Self Care | Payer: BLUE CROSS/BLUE SHIELD | Source: Ambulatory Visit | Attending: Radiation Oncology | Admitting: Radiation Oncology

## 2018-06-08 DIAGNOSIS — C155 Malignant neoplasm of lower third of esophagus: Secondary | ICD-10-CM | POA: Diagnosis not present

## 2018-06-10 ENCOUNTER — Other Ambulatory Visit: Payer: Self-pay | Admitting: Oncology

## 2018-06-11 ENCOUNTER — Inpatient Hospital Stay: Payer: BLUE CROSS/BLUE SHIELD

## 2018-06-11 ENCOUNTER — Ambulatory Visit
Admission: RE | Admit: 2018-06-11 | Discharge: 2018-06-11 | Disposition: A | Discharge status: Home / Self Care | Payer: BLUE CROSS/BLUE SHIELD | Source: Ambulatory Visit | Attending: Radiation Oncology | Admitting: Radiation Oncology

## 2018-06-11 ENCOUNTER — Telehealth: Payer: Self-pay | Admitting: *Deleted

## 2018-06-11 DIAGNOSIS — Z5111 Encounter for antineoplastic chemotherapy: Secondary | ICD-10-CM | POA: Diagnosis not present

## 2018-06-11 DIAGNOSIS — C155 Malignant neoplasm of lower third of esophagus: Secondary | ICD-10-CM

## 2018-06-11 LAB — URINALYSIS, COMPLETE (UACMP) WITH MICROSCOPIC
Bacteria, UA: NONE SEEN
Bilirubin Urine: NEGATIVE
Glucose, UA: NEGATIVE mg/dL
Hgb urine dipstick: NEGATIVE
KETONES UR: NEGATIVE mg/dL
LEUKOCYTES UA: NEGATIVE
Nitrite: NEGATIVE
Protein, ur: NEGATIVE mg/dL
SPECIFIC GRAVITY, URINE: 1.012 (ref 1.005–1.030)
pH: 6 (ref 5.0–8.0)

## 2018-06-11 LAB — CBC WITH DIFFERENTIAL (CANCER CENTER ONLY)
BASOS ABS: 0 10*3/uL (ref 0.0–0.1)
BASOS PCT: 1 %
EOS ABS: 0 10*3/uL (ref 0.0–0.5)
EOS PCT: 2 %
HCT: 41.5 % (ref 34.8–46.6)
Hemoglobin: 14 g/dL (ref 11.6–15.9)
Lymphocytes Relative: 19 %
Lymphs Abs: 0.5 10*3/uL — ABNORMAL LOW (ref 0.9–3.3)
MCH: 28.2 pg (ref 25.1–34.0)
MCHC: 33.6 g/dL (ref 31.5–36.0)
MCV: 83.8 fL (ref 79.5–101.0)
MONO ABS: 0.4 10*3/uL (ref 0.1–0.9)
MONOS PCT: 13 %
Neutro Abs: 1.8 10*3/uL (ref 1.5–6.5)
Neutrophils Relative %: 65 %
PLATELETS: 210 10*3/uL (ref 145–400)
RBC: 4.95 MIL/uL (ref 3.70–5.45)
RDW: 13.1 % (ref 11.2–14.5)
WBC Count: 2.8 10*3/uL — ABNORMAL LOW (ref 3.9–10.3)

## 2018-06-11 LAB — CMP (CANCER CENTER ONLY)
ALBUMIN: 4 g/dL (ref 3.5–5.0)
ALK PHOS: 77 U/L (ref 38–126)
ALT: 16 U/L (ref 0–44)
ANION GAP: 6 (ref 5–15)
AST: 12 U/L — ABNORMAL LOW (ref 15–41)
BUN: 13 mg/dL (ref 6–20)
CALCIUM: 9.5 mg/dL (ref 8.9–10.3)
CO2: 29 mmol/L (ref 22–32)
CREATININE: 0.77 mg/dL (ref 0.44–1.00)
Chloride: 104 mmol/L (ref 98–111)
GFR, Est AFR Am: >60 mL/min (ref >60)
GFR, Estimated: >60 mL/min (ref >60)
GLUCOSE: 92 mg/dL (ref 70–99)
Potassium: 4.4 mmol/L (ref 3.5–5.1)
SODIUM: 139 mmol/L (ref 135–145)
TOTAL PROTEIN: 7.1 g/dL (ref 6.5–8.1)
Total Bilirubin: 0.6 mg/dL (ref 0.3–1.2)

## 2018-06-11 MED ORDER — ONDANSETRON HCL 8 MG PO TABS: 8.0000 mg | ORAL_TABLET | Freq: Three times a day (TID) | ORAL | 0 refills | Status: DC | PRN

## 2018-06-11 NOTE — Telephone Encounter (Signed)
Patient called with concerns of flank pain, chills, headache and nausea. Patient denies using any antiemetic today or past use. She has compazine but concerned about drowsiness. Per Dr. Benay Spice, added u/a to labs today and sent Zofran to her pharmacy. Patient advised to have radiation RN evaluate her while in the office today per Dr. Benay Spice. Patient will contact this office with any other concerns or questions.

## 2018-06-11 NOTE — Progress Notes (Signed)
Patient came around following her treatment to have her vitals taken.  She stated that she is having some nausea and had chills last night, no fever associated.  She stated she had already spoken with Dr. Gearldine Shown nurse who told her to have her vitals taken after her radiation treatment to ensure that she did not have a temperature.  She also sent her for a urinalysis and mentioned maybe a trip to symptom management.  She also called her in some zofran to her pharmacy.  Here in the clinic vitals are stable.  I encouraged the patient to continue to eat and drink as much as she could, take her zofran, and to call back if she has any other symptoms or if she has no relief.  Will continue to follow as necessary.  BP 129/77   Pulse 84   Temp 98.3 F (36.8 C) (Oral)   Resp 16   SpO2 100%   Angelize Ryce M. Leonie Green, BSN

## 2018-06-12 ENCOUNTER — Other Ambulatory Visit: Payer: Self-pay

## 2018-06-12 ENCOUNTER — Inpatient Hospital Stay: Payer: BLUE CROSS/BLUE SHIELD | Attending: Oncology

## 2018-06-12 ENCOUNTER — Ambulatory Visit
Admission: RE | Admit: 2018-06-12 | Discharge: 2018-06-12 | Disposition: A | Discharge status: Home / Self Care | Payer: BLUE CROSS/BLUE SHIELD | Source: Ambulatory Visit | Attending: Radiation Oncology | Admitting: Radiation Oncology

## 2018-06-12 ENCOUNTER — Inpatient Hospital Stay: Payer: BLUE CROSS/BLUE SHIELD

## 2018-06-12 VITALS — BP 123/88 | HR 77 | Temp 98.0°F | Resp 16

## 2018-06-12 DIAGNOSIS — Z803 Family history of malignant neoplasm of breast: Secondary | ICD-10-CM | POA: Diagnosis not present

## 2018-06-12 DIAGNOSIS — G43909 Migraine, unspecified, not intractable, without status migrainosus: Secondary | ICD-10-CM | POA: Diagnosis not present

## 2018-06-12 DIAGNOSIS — C155 Malignant neoplasm of lower third of esophagus: Secondary | ICD-10-CM

## 2018-06-12 DIAGNOSIS — R197 Diarrhea, unspecified: Secondary | ICD-10-CM | POA: Diagnosis not present

## 2018-06-12 DIAGNOSIS — R112 Nausea with vomiting, unspecified: Secondary | ICD-10-CM | POA: Diagnosis not present

## 2018-06-12 DIAGNOSIS — Z79899 Other long term (current) drug therapy: Secondary | ICD-10-CM | POA: Insufficient documentation

## 2018-06-12 DIAGNOSIS — Z5111 Encounter for antineoplastic chemotherapy: Secondary | ICD-10-CM | POA: Insufficient documentation

## 2018-06-12 DIAGNOSIS — C160 Malignant neoplasm of cardia: Secondary | ICD-10-CM | POA: Insufficient documentation

## 2018-06-12 DIAGNOSIS — F17210 Nicotine dependence, cigarettes, uncomplicated: Secondary | ICD-10-CM | POA: Diagnosis not present

## 2018-06-12 MED ORDER — FAMOTIDINE IN NACL 20-0.9 MG/50ML-% IV SOLN
20.0000 mg | Freq: Once | INTRAVENOUS | Status: AC
  Administered 2018-06-12: 20 mg via INTRAVENOUS

## 2018-06-12 MED ORDER — DEXAMETHASONE SODIUM PHOSPHATE 10 MG/ML IJ SOLN
10.0000 mg | Freq: Once | INTRAMUSCULAR | Status: AC
  Administered 2018-06-12: 10 mg via INTRAVENOUS

## 2018-06-12 MED ORDER — SODIUM CHLORIDE 0.9 % IV SOLN
250.0000 mg | Freq: Once | INTRAVENOUS | Status: AC
  Administered 2018-06-12: 250 mg via INTRAVENOUS
  Filled 2018-06-12: qty 25

## 2018-06-12 MED ORDER — DIPHENHYDRAMINE HCL 50 MG/ML IJ SOLN
25.0000 mg | Freq: Once | INTRAMUSCULAR | Status: AC
  Administered 2018-06-12: 25 mg via INTRAVENOUS

## 2018-06-12 MED ORDER — DEXAMETHASONE SODIUM PHOSPHATE 10 MG/ML IJ SOLN
INTRAMUSCULAR | Status: AC
  Filled 2018-06-12: qty 1

## 2018-06-12 MED ORDER — PALONOSETRON HCL INJECTION 0.25 MG/5ML
0.2500 mg | Freq: Once | INTRAVENOUS | Status: AC
  Administered 2018-06-12: 0.25 mg via INTRAVENOUS

## 2018-06-12 MED ORDER — PALONOSETRON HCL INJECTION 0.25 MG/5ML
INTRAVENOUS | Status: AC
  Filled 2018-06-12: qty 5

## 2018-06-12 MED ORDER — SODIUM CHLORIDE 0.9 % IV SOLN
Freq: Once | INTRAVENOUS | Status: AC
  Administered 2018-06-12: 13:00:00 via INTRAVENOUS
  Filled 2018-06-12: qty 250

## 2018-06-12 MED ORDER — FAMOTIDINE IN NACL 20-0.9 MG/50ML-% IV SOLN
INTRAVENOUS | Status: AC
  Filled 2018-06-12: qty 50

## 2018-06-12 MED ORDER — DIPHENHYDRAMINE HCL 50 MG/ML IJ SOLN
INTRAMUSCULAR | Status: AC
  Filled 2018-06-12: qty 1

## 2018-06-12 MED ORDER — SODIUM CHLORIDE 0.9 % IV SOLN
50.0000 mg/m2 | Freq: Once | INTRAVENOUS | Status: AC
  Administered 2018-06-12: 102 mg via INTRAVENOUS
  Filled 2018-06-12: qty 17

## 2018-06-12 NOTE — Progress Notes (Signed)
Nutrition Follow-up:  Patient with adenocarcinoma of GE junction.  Patient receiving chemotherapy and radiation therapy.   Met with patient during infusion.  Chart reviewed.  Patient reports over the weekend did not feel well, nausea, dizziness, fatigue.  Reports that she continued to try and eat foods did drink an ensure on Monday am.  Reports took zofran and helped.  Reports feeling better today.    Medications: reviewed, zofran added  Labs: reviewed  Anthropometrics:   No new weight since weight of 189 lb 4.8 oz on 9/24   NUTRITION DIAGNOSIS: Inadequate oral intake stable   MALNUTRITION DIAGNOSIS: continue to monitor   INTERVENTION:  Patient aware to take nausea medication as needed to allow her to take in needed calories and protein.  Offered support and encouragement.       MONITORING, EVALUATION, GOAL: Patient will consume adequate calories and protein during treatment to maintain weight and nutrition   NEXT VISIT: October 15 during infusion  Kyair Ditommaso B. Zenia Resides, [place], [place] Registered Dietitian [phone] (pager)

## 2018-06-12 NOTE — Patient Instructions (Signed)
[facility] Discharge Instructions for Patients Receiving Chemotherapy  Today you received the following chemotherapy agents: Paclitaxel (Taxol) and Carboplatin (Paraplatin)  To help prevent nausea and vomiting after your treatment, we encourage you to take your nausea medication as directed.    If you develop nausea and vomiting that is not controlled by your nausea medication, call the clinic.   BELOW ARE SYMPTOMS THAT SHOULD BE REPORTED IMMEDIATELY:  *FEVER GREATER THAN 100.5 F  *CHILLS WITH OR WITHOUT FEVER  NAUSEA AND VOMITING THAT IS NOT CONTROLLED WITH YOUR NAUSEA MEDICATION  *UNUSUAL SHORTNESS OF BREATH  *UNUSUAL BRUISING OR BLEEDING  TENDERNESS IN MOUTH AND THROAT WITH OR WITHOUT PRESENCE OF ULCERS  *URINARY PROBLEMS  *BOWEL PROBLEMS  UNUSUAL RASH Items with * indicate a potential emergency and should be followed up as soon as possible.  Feel free to call the clinic should you have any questions or concerns. The clinic phone number is [phone].  Please show the CHEMO ALERT CARD at check-in to the Emergency Department and triage nurse.   

## 2018-06-13 ENCOUNTER — Ambulatory Visit
Admission: RE | Admit: 2018-06-13 | Discharge: 2018-06-13 | Disposition: A | Discharge status: Home / Self Care | Payer: BLUE CROSS/BLUE SHIELD | Source: Ambulatory Visit | Attending: Radiation Oncology | Admitting: Radiation Oncology

## 2018-06-13 DIAGNOSIS — C155 Malignant neoplasm of lower third of esophagus: Secondary | ICD-10-CM | POA: Diagnosis not present

## 2018-06-14 ENCOUNTER — Ambulatory Visit
Admission: RE | Admit: 2018-06-14 | Discharge: 2018-06-14 | Disposition: A | Discharge status: Home / Self Care | Payer: BLUE CROSS/BLUE SHIELD | Source: Ambulatory Visit | Attending: Radiation Oncology | Admitting: Radiation Oncology

## 2018-06-14 DIAGNOSIS — C155 Malignant neoplasm of lower third of esophagus: Secondary | ICD-10-CM | POA: Diagnosis not present

## 2018-06-15 ENCOUNTER — Other Ambulatory Visit: Payer: Self-pay | Admitting: Nurse Practitioner

## 2018-06-15 ENCOUNTER — Ambulatory Visit
Admission: RE | Admit: 2018-06-15 | Discharge: 2018-06-15 | Disposition: A | Discharge status: Home / Self Care | Payer: BLUE CROSS/BLUE SHIELD | Source: Ambulatory Visit | Attending: Radiation Oncology | Admitting: Radiation Oncology

## 2018-06-15 ENCOUNTER — Other Ambulatory Visit: Payer: Self-pay | Admitting: Radiation Oncology

## 2018-06-15 DIAGNOSIS — C160 Malignant neoplasm of cardia: Secondary | ICD-10-CM

## 2018-06-15 DIAGNOSIS — C155 Malignant neoplasm of lower third of esophagus: Secondary | ICD-10-CM | POA: Diagnosis not present

## 2018-06-15 MED ORDER — SUCRALFATE 1 G PO TABS: 1.0000 g | ORAL_TABLET | Freq: Four times a day (QID) | ORAL | 2 refills | Status: DC

## 2018-06-16 ENCOUNTER — Other Ambulatory Visit: Payer: Self-pay | Admitting: Oncology

## 2018-06-18 ENCOUNTER — Other Ambulatory Visit: Payer: Self-pay | Admitting: *Deleted

## 2018-06-18 ENCOUNTER — Other Ambulatory Visit: Payer: Self-pay

## 2018-06-18 ENCOUNTER — Ambulatory Visit
Admission: RE | Admit: 2018-06-18 | Discharge: 2018-06-18 | Disposition: A | Discharge status: Home / Self Care | Payer: BLUE CROSS/BLUE SHIELD | Source: Ambulatory Visit | Attending: Radiation Oncology | Admitting: Radiation Oncology

## 2018-06-18 ENCOUNTER — Encounter: Payer: Self-pay | Admitting: Genetic Counselor

## 2018-06-18 DIAGNOSIS — C160 Malignant neoplasm of cardia: Secondary | ICD-10-CM

## 2018-06-18 DIAGNOSIS — C155 Malignant neoplasm of lower third of esophagus: Secondary | ICD-10-CM | POA: Diagnosis not present

## 2018-06-19 ENCOUNTER — Other Ambulatory Visit: Payer: Self-pay | Admitting: Oncology

## 2018-06-19 ENCOUNTER — Ambulatory Visit
Admission: RE | Admit: 2018-06-19 | Discharge: 2018-06-19 | Disposition: A | Discharge status: Home / Self Care | Payer: BLUE CROSS/BLUE SHIELD | Source: Ambulatory Visit | Attending: Radiation Oncology | Admitting: Radiation Oncology

## 2018-06-19 ENCOUNTER — Inpatient Hospital Stay: Payer: BLUE CROSS/BLUE SHIELD | Admitting: Oncology

## 2018-06-19 ENCOUNTER — Other Ambulatory Visit: Payer: Self-pay | Admitting: Emergency Medicine

## 2018-06-19 ENCOUNTER — Inpatient Hospital Stay: Payer: BLUE CROSS/BLUE SHIELD

## 2018-06-19 ENCOUNTER — Telehealth: Payer: Self-pay | Admitting: Oncology

## 2018-06-19 VITALS — BP 116/71 | HR 80 | Temp 98.2°F | Resp 18 | Ht 69.0 in | Wt 186.0 lb

## 2018-06-19 DIAGNOSIS — Z5111 Encounter for antineoplastic chemotherapy: Secondary | ICD-10-CM | POA: Diagnosis not present

## 2018-06-19 DIAGNOSIS — Z79899 Other long term (current) drug therapy: Secondary | ICD-10-CM

## 2018-06-19 DIAGNOSIS — Z803 Family history of malignant neoplasm of breast: Secondary | ICD-10-CM

## 2018-06-19 DIAGNOSIS — G43909 Migraine, unspecified, not intractable, without status migrainosus: Secondary | ICD-10-CM | POA: Diagnosis not present

## 2018-06-19 DIAGNOSIS — C160 Malignant neoplasm of cardia: Secondary | ICD-10-CM

## 2018-06-19 DIAGNOSIS — C155 Malignant neoplasm of lower third of esophagus: Secondary | ICD-10-CM | POA: Diagnosis not present

## 2018-06-19 LAB — CBC WITH DIFFERENTIAL (CANCER CENTER ONLY)
BASOS ABS: 0 10*3/uL (ref 0.0–0.1)
Basophils Relative: 1 %
EOS ABS: 0 10*3/uL (ref 0.0–0.5)
Eosinophils Relative: 1 %
HCT: 39.7 % (ref 34.8–46.6)
Hemoglobin: 13.2 g/dL (ref 12.0–15.0)
LYMPHS ABS: 0.2 10*3/uL — AB (ref 0.9–3.3)
Lymphocytes Relative: 6 %
MCH: 28.4 pg (ref 25.1–34.0)
MCHC: 33.2 g/dL (ref 31.5–36.0)
MCV: 85.4 fL (ref 79.5–101.0)
Monocytes Absolute: 0.8 10*3/uL (ref 0.1–0.9)
Monocytes Relative: 18 %
NRBC: 0 % (ref 0.0–0.2)
Neutro Abs: 3.1 10*3/uL (ref 1.5–6.5)
Neutrophils Relative %: 74 %
Platelet Count: 156 10*3/uL (ref 150–400)
RBC: 4.65 MIL/uL (ref 3.70–5.45)
RDW: 13.4 % (ref 11.2–14.5)
WBC Count: 4.1 10*3/uL (ref 4.0–10.5)

## 2018-06-19 LAB — CMP (CANCER CENTER ONLY)
ALK PHOS: 75 U/L (ref 38–126)
ALT: 17 U/L (ref 0–44)
AST: 14 U/L — ABNORMAL LOW (ref 15–41)
Albumin: 3.7 g/dL (ref 3.5–5.0)
Anion gap: 6 (ref 5–15)
BILIRUBIN TOTAL: 0.5 mg/dL (ref 0.3–1.2)
BUN: 15 mg/dL (ref 6–20)
CALCIUM: 9.3 mg/dL (ref 8.9–10.3)
CO2: 28 mmol/L (ref 22–32)
CREATININE: 0.81 mg/dL (ref 0.44–1.00)
Chloride: 105 mmol/L (ref 98–111)
Glucose, Bld: 99 mg/dL (ref 70–99)
Potassium: 4.5 mmol/L (ref 3.5–5.1)
Sodium: 139 mmol/L (ref 135–145)
Total Protein: 6.8 g/dL (ref 6.5–8.1)

## 2018-06-19 NOTE — Progress Notes (Signed)
  [facility] OFFICE PROGRESS NOTE   Diagnosis: Gastroesophageal cancer  INTERVAL HISTORY:   Rhonda Newton returns as scheduled.  She continues radiation and weekly Taxol/carboplatin.  No neuropathy symptoms.  She has mild dysphasia, but this has improved since beginning treatment.  She reports intermittent episodes of nausea.  No emesis.  The nausea is not relieved with Zofran or Compazine.  Objective:  Vital signs in last 24 hours:  Blood pressure 116/71, pulse 80, temperature 98.2 F (36.8 C), temperature source Oral, resp. rate 18, height 5' 9" (1.753 m), weight 186 lb (84.4 kg), SpO2 100 %.    HEENT: No thrush or ulcers Resp: Lungs clear bilaterally Cardio: Regular rate and rhythm GI: No hepatomegaly, no mass, nontender Vascular: No leg edema    Portacath/PICC-without erythema  Lab Results:  Lab Results  Component Value Date   WBC 4.1 06/19/2018   HGB 13.2 06/19/2018   HCT 39.7 06/19/2018   MCV 85.4 06/19/2018   PLT 156 06/19/2018   NEUTROABS 3.1 06/19/2018    CMP  Lab Results  Component Value Date   NA 139 06/19/2018   K 4.5 06/19/2018   CL 105 06/19/2018   CO2 28 06/19/2018   GLUCOSE 99 06/19/2018   BUN 15 06/19/2018   CREATININE 0.81 06/19/2018   CALCIUM 9.3 06/19/2018   PROT 6.8 06/19/2018   ALBUMIN 3.7 06/19/2018   AST 14 (L) 06/19/2018   ALT 17 06/19/2018   ALKPHOS 75 06/19/2018   BILITOT 0.5 06/19/2018   GFRNONAA >60 06/19/2018   GFRAA >60 06/19/2018    Medications: I have reviewed the patient's current medications.   Assessment/Plan: 1. Gastroesophageal carcinoma ? 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 CTs 05/04/2018, distal esophagus mass extending to to below the GE junction, no evidence of metastatic disease, 4 mm nonspecific right upper lobe nodule ? Upper EUS  05/11/2018- large fungating ulcerating mass in the distal esophagus about 34 to 38 cm from the incisors, tumor extends to the GE junction; stagedT3 N1 MX ? Radiation initiated 05/28/2018 ? Cycle 1 weekly carboplatin/Taxol 05/29/2018 ? Cycle 2 weekly carboplatin/Taxol 06/06/2018 ? Cycle 3 weekly carboplatin/Taxol 06/12/2018 ? Cycle 4 weekly carboplatin/Taxol 06/20/2018 2. Migraine headaches 3. Multiple family members with breast cancer 4. Tobacco use    Disposition: Mr. Done has completed 3 weeks of Taxol/carboplatin and concurrent radiation.  She is tolerating the treatment well, though she has developed mild nausea.  She will try lorazepam for nausea. She will complete week for chemotherapy on 06/20/2018 and the final cycle of chemotherapy on 06/26/2018.  She will return for an office visit during the last week of radiation.  Betsy Coder, MD  06/19/2018  9:48 AM

## 2018-06-19 NOTE — Telephone Encounter (Signed)
Faxed records to Dr. Florentina Jenny office.

## 2018-06-19 NOTE — Telephone Encounter (Signed)
Scheduled appt per 10/8 los - patient to get an updated schedule next visit

## 2018-06-20 ENCOUNTER — Inpatient Hospital Stay: Payer: BLUE CROSS/BLUE SHIELD

## 2018-06-20 ENCOUNTER — Ambulatory Visit
Admission: RE | Admit: 2018-06-20 | Discharge: 2018-06-20 | Disposition: A | Discharge status: Home / Self Care | Payer: BLUE CROSS/BLUE SHIELD | Source: Ambulatory Visit | Attending: Radiation Oncology | Admitting: Radiation Oncology

## 2018-06-20 ENCOUNTER — Inpatient Hospital Stay (HOSPITAL_BASED_OUTPATIENT_CLINIC_OR_DEPARTMENT_OTHER): Payer: BLUE CROSS/BLUE SHIELD | Admitting: Genetic Counselor

## 2018-06-20 ENCOUNTER — Encounter: Payer: Self-pay | Admitting: Nurse Practitioner

## 2018-06-20 VITALS — BP 114/69 | HR 80 | Temp 97.7°F | Resp 16

## 2018-06-20 DIAGNOSIS — C155 Malignant neoplasm of lower third of esophagus: Secondary | ICD-10-CM | POA: Diagnosis not present

## 2018-06-20 DIAGNOSIS — Z8042 Family history of malignant neoplasm of prostate: Secondary | ICD-10-CM

## 2018-06-20 DIAGNOSIS — Z5111 Encounter for antineoplastic chemotherapy: Secondary | ICD-10-CM | POA: Diagnosis not present

## 2018-06-20 DIAGNOSIS — Z1379 Encounter for other screening for genetic and chromosomal anomalies: Secondary | ICD-10-CM

## 2018-06-20 DIAGNOSIS — Z803 Family history of malignant neoplasm of breast: Secondary | ICD-10-CM

## 2018-06-20 DIAGNOSIS — Z800 Family history of malignant neoplasm of digestive organs: Secondary | ICD-10-CM

## 2018-06-20 DIAGNOSIS — Z808 Family history of malignant neoplasm of other organs or systems: Secondary | ICD-10-CM

## 2018-06-20 MED ORDER — SODIUM CHLORIDE 0.9 % IV SOLN
Freq: Once | INTRAVENOUS | Status: AC
  Administered 2018-06-20: 09:00:00 via INTRAVENOUS
  Filled 2018-06-20: qty 250

## 2018-06-20 MED ORDER — DIPHENHYDRAMINE HCL 50 MG/ML IJ SOLN
25.0000 mg | Freq: Once | INTRAMUSCULAR | Status: AC
  Administered 2018-06-20: 25 mg via INTRAVENOUS

## 2018-06-20 MED ORDER — DEXAMETHASONE SODIUM PHOSPHATE 10 MG/ML IJ SOLN
INTRAMUSCULAR | Status: AC
  Filled 2018-06-20: qty 1

## 2018-06-20 MED ORDER — PALONOSETRON HCL INJECTION 0.25 MG/5ML
INTRAVENOUS | Status: AC
  Filled 2018-06-20: qty 5

## 2018-06-20 MED ORDER — DEXAMETHASONE SODIUM PHOSPHATE 10 MG/ML IJ SOLN
10.0000 mg | Freq: Once | INTRAMUSCULAR | Status: AC
  Administered 2018-06-20: 10 mg via INTRAVENOUS

## 2018-06-20 MED ORDER — PALONOSETRON HCL INJECTION 0.25 MG/5ML
0.2500 mg | Freq: Once | INTRAVENOUS | Status: AC
  Administered 2018-06-20: 0.25 mg via INTRAVENOUS

## 2018-06-20 MED ORDER — DIPHENHYDRAMINE HCL 50 MG/ML IJ SOLN
INTRAMUSCULAR | Status: AC
  Filled 2018-06-20: qty 1

## 2018-06-20 MED ORDER — SODIUM CHLORIDE 0.9 % IV SOLN
50.0000 mg/m2 | Freq: Once | INTRAVENOUS | Status: AC
  Administered 2018-06-20: 102 mg via INTRAVENOUS
  Filled 2018-06-20: qty 17

## 2018-06-20 MED ORDER — SODIUM CHLORIDE 0.9 % IV SOLN
250.0000 mg | Freq: Once | INTRAVENOUS | Status: AC
  Administered 2018-06-20: 250 mg via INTRAVENOUS
  Filled 2018-06-20: qty 25

## 2018-06-20 MED ORDER — FAMOTIDINE IN NACL 20-0.9 MG/50ML-% IV SOLN
INTRAVENOUS | Status: AC
  Filled 2018-06-20: qty 50

## 2018-06-20 MED ORDER — FAMOTIDINE IN NACL 20-0.9 MG/50ML-% IV SOLN
20.0000 mg | Freq: Once | INTRAVENOUS | Status: AC
  Administered 2018-06-20: 20 mg via INTRAVENOUS

## 2018-06-20 NOTE — Patient Instructions (Signed)
[facility] Discharge Instructions for Patients Receiving Chemotherapy  Today you received the following chemotherapy agents: Paclitaxel (Taxol) and Carboplatin (Paraplatin)  To help prevent nausea and vomiting after your treatment, we encourage you to take your nausea medication as directed.    If you develop nausea and vomiting that is not controlled by your nausea medication, call the clinic.   BELOW ARE SYMPTOMS THAT SHOULD BE REPORTED IMMEDIATELY:  *FEVER GREATER THAN 100.5 F  *CHILLS WITH OR WITHOUT FEVER  NAUSEA AND VOMITING THAT IS NOT CONTROLLED WITH YOUR NAUSEA MEDICATION  *UNUSUAL SHORTNESS OF BREATH  *UNUSUAL BRUISING OR BLEEDING  TENDERNESS IN MOUTH AND THROAT WITH OR WITHOUT PRESENCE OF ULCERS  *URINARY PROBLEMS  *BOWEL PROBLEMS  UNUSUAL RASH Items with * indicate a potential emergency and should be followed up as soon as possible.  Feel free to call the clinic should you have any questions or concerns. The clinic phone number is [phone].  Please show the CHEMO ALERT CARD at check-in to the Emergency Department and triage nurse.   

## 2018-06-21 ENCOUNTER — Ambulatory Visit
Admission: RE | Admit: 2018-06-21 | Discharge: 2018-06-21 | Disposition: A | Discharge status: Home / Self Care | Payer: BLUE CROSS/BLUE SHIELD | Source: Ambulatory Visit | Attending: Radiation Oncology | Admitting: Radiation Oncology

## 2018-06-21 ENCOUNTER — Encounter: Payer: Self-pay | Admitting: Genetic Counselor

## 2018-06-21 ENCOUNTER — Other Ambulatory Visit: Payer: Self-pay | Admitting: *Deleted

## 2018-06-21 DIAGNOSIS — C155 Malignant neoplasm of lower third of esophagus: Secondary | ICD-10-CM | POA: Diagnosis not present

## 2018-06-21 DIAGNOSIS — Z8042 Family history of malignant neoplasm of prostate: Secondary | ICD-10-CM | POA: Insufficient documentation

## 2018-06-21 DIAGNOSIS — Z803 Family history of malignant neoplasm of breast: Secondary | ICD-10-CM | POA: Insufficient documentation

## 2018-06-21 DIAGNOSIS — Z808 Family history of malignant neoplasm of other organs or systems: Secondary | ICD-10-CM | POA: Insufficient documentation

## 2018-06-21 DIAGNOSIS — Z800 Family history of malignant neoplasm of digestive organs: Secondary | ICD-10-CM | POA: Insufficient documentation

## 2018-06-21 MED ORDER — PROMETHAZINE HCL 12.5 MG PO TABS: 25.0000 mg | ORAL_TABLET | Freq: Four times a day (QID) | ORAL | 0 refills | Status: DC | PRN

## 2018-06-21 NOTE — Progress Notes (Signed)
REFERRING PROVIDER: Ladell Pier, MD [address] [place], [place] [address]  PRIMARY PROVIDER:  Avon Gully, NP  PRIMARY REASON FOR VISIT:  1. Malignant neoplasm of lower third of esophagus (HCC)   2. Family history of breast cancer   3. Family history of colon cancer   4. Family history of prostate cancer   5. Family history of melanoma      HISTORY OF PRESENT ILLNESS:   Rhonda Newton, a 52 y.o. female, was seen for a [facility] cancer genetics consultation at the request of Dr. Benay Spice due to a personal and family history of cancer.  [name] presents to clinic today to discuss the possibility of a hereditary predisposition to cancer, genetic testing, and to further clarify her future cancer risks, as well as potential cancer risks for family members.   In September 2019, at the age of 72, [name] was diagnosed with cancer of the lower third of the esophagus. This was treated with chemotherapy so far. Her mother had genetic testing in 2016 through the [facility], and was negative for 17 breast cancer genes.   CANCER HISTORY:    Malignant neoplasm of lower third of esophagus ([facility])   05/16/2018 Initial Diagnosis    Malignant neoplasm of lower third of esophagus ([facility])    05/29/2018 Cancer Staging    Staging form: Esophagus - Adenocarcinoma, AJCC 8th Edition - Clinical: Stage III (cT3, cN1, cM0, G3) - Signed by [name], MD on 05/29/2018    05/29/2018 -  Chemotherapy    The patient had palonosetron (ALOXI) injection 0.25 mg, 0.25 mg, Intravenous,  Once, 1 of 1 cycle Administration: 0.25 mg (05/29/2018), 0.25 mg (06/06/2018), 0.25 mg (06/12/2018), 0.25 mg (06/20/2018) CARBOplatin (PARAPLATIN) 250 mg in sodium chloride 0.9 % 250 mL chemo infusion, 250 mg (100 % of original dose 250 mg), Intravenous,  Once, 1 of 1 cycle Dose modification: 250 mg (original dose 250 mg, Cycle 1), 250 mg (original dose 250 mg, Cycle 1), 250 mg (original dose 250 mg,  Cycle 1) Administration: 250 mg (05/29/2018), 250 mg (06/06/2018), 250 mg (06/12/2018), 250 mg (06/20/2018) PACLitaxel (TAXOL) 102 mg in sodium chloride 0.9 % 250 mL chemo infusion (</= 78m/m2), 50 mg/m2 = 102 mg, Intravenous,  Once, 1 of 1 cycle Administration: 102 mg (05/29/2018), 102 mg (06/06/2018), 102 mg (06/12/2018), 102 mg (06/20/2018)  for chemotherapy treatment.       HORMONAL RISK FACTORS:  Menarche was at age [age]  First live birth at age 52  OCP use for approximately 7-10 years.  Ovaries intact: yes.  Hysterectomy: no.  Menopausal status: perimenopausal.  HRT use: 0 years. Colonoscopy: yes; 1 polyp.  Next colonoscopy in 5 years.. Mammogram within the last year: yes. Number of breast biopsies: 1. Up to date with pelvic exams:  yes. Any excessive radiation exposure in the past:  no  Past Medical History:  Diagnosis Date  . Anxiety   . Anxiety and depression   . Barrett's esophagus 05/18/2018  . Depression   . Family history of breast cancer   . Family history of colon cancer   . Family history of melanoma   . Family history of prostate cancer   . 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 cataracts Bilateral    age 52 . INTRAUTERINE DEVICE INSERTION     Paraguard    Social History   Socioeconomic History  . Marital  status: Divorced    Spouse name: Not on file  . Number of children: Not on file  . Years of education: Not on file  . Highest education level: Not on file  Occupational History  . Not on file  Social Needs  . Financial resource strain: Not on file  . Food insecurity:    Worry: Not on file    Inability: Not on file  . Transportation needs:    Medical: Not on file    Non-medical: Not on file  Tobacco Use  . Smoking status: Current Every Day Smoker    Packs/day: 1.00    Types: Cigarettes  . Smokeless tobacco: Never Used  Substance and Sexual Activity  . Alcohol use: No    Comment:  occasional  . Drug use: Not Currently    Types: Marijuana  . Sexual activity: Yes    Birth control/protection: IUD  Lifestyle  . Physical activity:    Days per week: Not on file    Minutes per session: Not on file  . Stress: Not on file  Relationships  . Social connections:    Talks on phone: Not on file    Gets together: Not on file    Attends religious service: Not on file    Active member of club or organization: Not on file    Attends meetings of clubs or organizations: Not on file    Relationship status: Not on file  Other Topics Concern  . Not on file  Social History Narrative  . Not on file     FAMILY HISTORY:  We obtained a detailed, 4-generation family history.  Significant diagnoses are listed below: Family History  Problem Relation Age of Onset  . Barrett's esophagus Mother   . Hypertension Mother   . Breast cancer Mother 29  . Breast cancer Sister 57  . Breast cancer Maternal Aunt 73  . Barrett's esophagus Maternal Aunt   . Breast cancer Paternal Aunt        dx in her 28s  . Heart disease Father   . Prostate cancer Maternal Uncle        dx in his mid 9s  . COPD Paternal Uncle   . Heart disease Maternal Grandmother   . Heart disease Maternal Grandfather   . Congestive Heart Failure Paternal Grandmother   . Dementia Paternal Grandmother   . Kidney cancer Paternal Grandfather   . Melanoma Cousin        mat first cousin; dx 59s-40s  . Prostate cancer Cousin        mat first cousin dx in his mid 26s  . Breast cancer Other        PGMs sister  . Colon cancer Other        PGMs sister  . Pancreatic cancer Other        MGFs sister    The patient has one son who is cancer free.  She has a sister who had breast cancer at 67.  Both parents are living.  The patient's mother had breast cancer at 23.  She underwent genetic testing for 17 breast cancer genes and was negative.  She has an identical twin sister who had breast cancer at 17.  Her son had melanoma in  his 37's-40's.  She also had a brother who had prostate cancer in his 26's and died at 69.  His son also had prostate cancer in his 16's.  The maternal grandparents are deceased. The grandfather had heart  disease.  He had a sister with pancreatic cancer and a brother with bone cancer.  The patient's father is living.  He had two brothers and a sister.  The sister had breast cancer in her 53's, one brother died as an infant and the other brother died of COPD.  The paternal grandparents are deceased. The grandfather had kidney cancer and his brother had lung cancer.  The grandmother had heart failure, and had two sisters, one with breast cancer and the other with colon.  Ms. Flavell is aware of previous family history of genetic testing for hereditary cancer risks. Patient's maternal ancestors are of [place] and [place] descent, and paternal ancestors are of [place], [place] and [place] descent. There is no reported Ashkenazi Jewish ancestry. There is no known consanguinity.  GENETIC COUNSELING ASSESSMENT: Younique Casad is a 52 y.o. female with a personal and family history of cancer which is somewhat suggestive of a hereditary cancer syndrome and predisposition to cancer. We, therefore, discussed and recommended the following at today's visit.   DISCUSSION: We discussed that about 3-5% of stomach cancers are hereditary.  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  This is more consistent with a CDH1 hereditary change.  The patient's mother had negative genetic testing in 2016 for CDH1.  We discussed that there is a chance that there is a genetic change coming from her father's side of the family, or that there is a CDH1 mutation in her mother that was missed.  We can offer genetic testing through RNA, in addition to DNA.  It is estimated that about 2-3% of patients will benefit from RNA testing.    We reviewed the characteristics, features and inheritance  patterns of hereditary cancer syndromes. We also discussed genetic testing, including the appropriate family members to test, the process of testing, insurance coverage and turn-around-time for results. We discussed the implications of a negative, positive and/or variant of uncertain significant result. We recommended Ms. Verhoeven pursue genetic testing for the CancerNext Expanded +RNA gene panel. The CancerNext-Expanded gene panel offered by [facility] and includes sequencing and rearrangement analysis for the following 67 genes: AIP, ALK, APC, ATM, BAP1, BARD1, BLM, BMPR1A, BRCA1, BRCA2, BRIP1, CDH1, CDK4, CDKN1B, CDKN2A, CHEK2, DICER1, EPCAM, FANCC, FH, FLCN, GALNT12, GREM1, HOXB13, MAX, MEN1, MET, MITF, MLH1, MRE11A, MSH2, MSH6, MUTYH, NBN, NF1, NF2, PALB2, PHOX2B, PMS2, POLD1, POLE, POT1, PRKAR1A, PTCH1, PTEN, RAD50, RAD51D, RB1, RET, SDHA, SDHAF2, SDHB, SDHC, SDHD, SMAD4, SMARCA4, SMARCB1, STK11, SUFU, TMEM127, TP53, TSC1, TSC2, VHL and XRCC2.   Based on Ms. Jayson's personal and family history of cancer, she meets medical criteria for genetic testing. Despite that she meets criteria, she may still have an out of pocket cost. We discussed that if her out of pocket cost for testing is over $100, the laboratory will call and confirm whether she wants to proceed with testing.  If the out of pocket cost of testing is less than $100 she will be billed by the genetic testing laboratory.   PLAN: After considering the risks, benefits, and limitations, Ms. Frankl  provided informed consent to pursue genetic testing and the blood sample was sent to [facility] for analysis of the CancerNext Expanded + RNA. Results should be available within approximately 2-3 weeks' time, at which point they will be disclosed by telephone to Ms. Reffner, as will any additional recommendations warranted by these results. Ms. Ganesh will receive a summary of her genetic counseling visit  and a copy of her results once  available. This information will also be available in Epic. We encouraged Ms. Concepcion to remain in contact with cancer genetics annually so that we can continuously update the family history and inform her of any changes in cancer genetics and testing that may be of benefit for her family. Ms. Thurston questions were answered to her satisfaction today. Our contact information was provided should additional questions or concerns arise.  Lastly, we encouraged Ms. Zylstra to remain in contact with cancer genetics annually so that we can continuously update the family history and inform her of any changes in cancer genetics and testing that may be of benefit for this family.   Ms.  Touchette questions were answered to her satisfaction today. Our contact information was provided should additional questions or concerns arise. Thank you for the referral and allowing [place] to share in the care of your patient.   Art Levan P. Florene Glen, [place], [facility] Certified Genetic Counselor [email or web address] phone: [phone]  The patient was seen for a total of 45 minutes in face-to-face genetic counseling.  This patient was discussed with Drs. Magrinat, Lindi Adie and/or Burr Medico who agrees with the above.    _______________________________________________________________________ For Office Staff:  Number of people involved in session: 2 Was an Intern/ student involved with case: no

## 2018-06-22 ENCOUNTER — Ambulatory Visit
Admission: RE | Admit: 2018-06-22 | Discharge: 2018-06-22 | Disposition: A | Discharge status: Home / Self Care | Payer: BLUE CROSS/BLUE SHIELD | Source: Ambulatory Visit | Attending: Radiation Oncology | Admitting: Radiation Oncology

## 2018-06-22 DIAGNOSIS — C155 Malignant neoplasm of lower third of esophagus: Secondary | ICD-10-CM | POA: Diagnosis not present

## 2018-06-24 ENCOUNTER — Other Ambulatory Visit: Payer: Self-pay | Admitting: Oncology

## 2018-06-25 ENCOUNTER — Ambulatory Visit
Admission: RE | Admit: 2018-06-25 | Discharge: 2018-06-25 | Disposition: A | Discharge status: Home / Self Care | Payer: BLUE CROSS/BLUE SHIELD | Source: Ambulatory Visit | Attending: Radiation Oncology | Admitting: Radiation Oncology

## 2018-06-25 ENCOUNTER — Inpatient Hospital Stay: Payer: BLUE CROSS/BLUE SHIELD

## 2018-06-25 DIAGNOSIS — Z5111 Encounter for antineoplastic chemotherapy: Secondary | ICD-10-CM | POA: Diagnosis not present

## 2018-06-25 DIAGNOSIS — C160 Malignant neoplasm of cardia: Secondary | ICD-10-CM

## 2018-06-25 DIAGNOSIS — C155 Malignant neoplasm of lower third of esophagus: Secondary | ICD-10-CM | POA: Diagnosis not present

## 2018-06-25 LAB — CBC WITH DIFFERENTIAL (CANCER CENTER ONLY)
Abs Immature Granulocytes: 0.02 10*3/uL (ref 0.00–0.07)
BASOS ABS: 0 10*3/uL (ref 0.0–0.1)
BASOS PCT: 2 %
EOS ABS: 0 10*3/uL (ref 0.0–0.5)
Eosinophils Relative: 1 %
HCT: 39.8 % (ref 36.0–46.0)
Hemoglobin: 13.6 g/dL (ref 12.0–15.0)
IMMATURE GRANULOCYTES: 1 %
LYMPHS ABS: 0.3 10*3/uL — AB (ref 0.7–4.0)
Lymphocytes Relative: 11 %
MCH: 28.6 pg (ref 26.0–34.0)
MCHC: 34.2 g/dL (ref 30.0–36.0)
MCV: 83.6 fL (ref 80.0–100.0)
Monocytes Absolute: 0.4 10*3/uL (ref 0.1–1.0)
Monocytes Relative: 15 %
NEUTROS PCT: 70 %
NRBC: 0 % (ref 0.0–0.2)
Neutro Abs: 1.7 10*3/uL (ref 1.7–7.7)
Platelet Count: 129 10*3/uL — ABNORMAL LOW (ref 150–400)
RBC: 4.76 MIL/uL (ref 3.87–5.11)
RDW: 13.1 % (ref 11.5–15.5)
WBC Count: 2.5 10*3/uL — ABNORMAL LOW (ref 4.0–10.5)

## 2018-06-26 ENCOUNTER — Other Ambulatory Visit: Payer: Self-pay

## 2018-06-26 ENCOUNTER — Encounter: Payer: Self-pay | Admitting: Oncology

## 2018-06-26 ENCOUNTER — Inpatient Hospital Stay: Payer: BLUE CROSS/BLUE SHIELD

## 2018-06-26 ENCOUNTER — Ambulatory Visit: Payer: Self-pay

## 2018-06-26 ENCOUNTER — Encounter: Payer: Self-pay | Admitting: Emergency Medicine

## 2018-06-26 ENCOUNTER — Ambulatory Visit
Admission: RE | Admit: 2018-06-26 | Discharge: 2018-06-26 | Disposition: A | Discharge status: Home / Self Care | Payer: BLUE CROSS/BLUE SHIELD | Source: Ambulatory Visit | Attending: Radiation Oncology | Admitting: Radiation Oncology

## 2018-06-26 VITALS — BP 127/75 | HR 84 | Temp 98.2°F | Resp 17 | Ht 69.0 in | Wt 181.2 lb

## 2018-06-26 DIAGNOSIS — Z5111 Encounter for antineoplastic chemotherapy: Secondary | ICD-10-CM | POA: Diagnosis not present

## 2018-06-26 DIAGNOSIS — C155 Malignant neoplasm of lower third of esophagus: Secondary | ICD-10-CM

## 2018-06-26 MED ORDER — DIPHENHYDRAMINE HCL 50 MG/ML IJ SOLN
25.0000 mg | Freq: Once | INTRAMUSCULAR | Status: AC
  Administered 2018-06-26: 25 mg via INTRAVENOUS

## 2018-06-26 MED ORDER — FAMOTIDINE IN NACL 20-0.9 MG/50ML-% IV SOLN
INTRAVENOUS | Status: AC
  Filled 2018-06-26: qty 50

## 2018-06-26 MED ORDER — DEXAMETHASONE SODIUM PHOSPHATE 10 MG/ML IJ SOLN
10.0000 mg | Freq: Once | INTRAMUSCULAR | Status: AC
  Administered 2018-06-26: 10 mg via INTRAVENOUS

## 2018-06-26 MED ORDER — PALONOSETRON HCL INJECTION 0.25 MG/5ML
INTRAVENOUS | Status: AC
  Filled 2018-06-26: qty 5

## 2018-06-26 MED ORDER — SODIUM CHLORIDE 0.9 % IV SOLN
Freq: Once | INTRAVENOUS | Status: AC
  Administered 2018-06-26: 10:00:00 via INTRAVENOUS
  Filled 2018-06-26: qty 250

## 2018-06-26 MED ORDER — PALONOSETRON HCL INJECTION 0.25 MG/5ML
0.2500 mg | Freq: Once | INTRAVENOUS | Status: AC
  Administered 2018-06-26: 0.25 mg via INTRAVENOUS

## 2018-06-26 MED ORDER — FAMOTIDINE IN NACL 20-0.9 MG/50ML-% IV SOLN
20.0000 mg | Freq: Once | INTRAVENOUS | Status: AC
  Administered 2018-06-26: 20 mg via INTRAVENOUS

## 2018-06-26 MED ORDER — DEXAMETHASONE SODIUM PHOSPHATE 10 MG/ML IJ SOLN
INTRAMUSCULAR | Status: AC
  Filled 2018-06-26: qty 1

## 2018-06-26 MED ORDER — SODIUM CHLORIDE 0.9 % IV SOLN
250.0000 mg | Freq: Once | INTRAVENOUS | Status: AC
  Administered 2018-06-26: 250 mg via INTRAVENOUS
  Filled 2018-06-26: qty 25

## 2018-06-26 MED ORDER — DIPHENHYDRAMINE HCL 50 MG/ML IJ SOLN
INTRAMUSCULAR | Status: AC
  Filled 2018-06-26: qty 1

## 2018-06-26 MED ORDER — SODIUM CHLORIDE 0.9 % IV SOLN
50.0000 mg/m2 | Freq: Once | INTRAVENOUS | Status: AC
  Administered 2018-06-26: 102 mg via INTRAVENOUS
  Filled 2018-06-26: qty 17

## 2018-06-26 NOTE — Patient Instructions (Signed)
Index Discharge Instructions for Patients Receiving Chemotherapy  Today you received the following chemotherapy agents: Paclitaxel (Taxol) and Carboplatin (Paraplatin)  To help prevent nausea and vomiting after your treatment, we encourage you to take your nausea medication as prescribed. Received Aloxi during treatment today-->Take Compazine or Phenergan (not Zofran) for the next 3 days as needed.    If you develop nausea and vomiting that is not controlled by your nausea medication, call the clinic.   BELOW ARE SYMPTOMS THAT SHOULD BE REPORTED IMMEDIATELY:  *FEVER GREATER THAN 100.5 F  *CHILLS WITH OR WITHOUT FEVER  NAUSEA AND VOMITING THAT IS NOT CONTROLLED WITH YOUR NAUSEA MEDICATION  *UNUSUAL SHORTNESS OF BREATH  *UNUSUAL BRUISING OR BLEEDING  TENDERNESS IN MOUTH AND THROAT WITH OR WITHOUT PRESENCE OF ULCERS  *URINARY PROBLEMS  *BOWEL PROBLEMS  UNUSUAL RASH Items with * indicate a potential emergency and should be followed up as soon as possible.  Feel free to call the clinic should you have any questions or concerns. The clinic phone number is [phone].  Please show the [place] at check-in to the Emergency Department and triage nurse.

## 2018-06-26 NOTE — Progress Notes (Signed)
Ok to tx w/ cmet from 10/8 per GBS.

## 2018-06-26 NOTE — Progress Notes (Signed)
Nutrition Follow-up:  Patient with adenocarcinoma GE junction.  Patient receiving chemotherapy and radiation therapy.  Last chemotherapy treatment today and last radiation 10/23.    Met with patient during infusion.  Patient reports more issues with nausea.  Drank 1/2 ensure this am and felt nauseated.  Ate ice cream sandwich last night and did fine. Has been trying different foods some with success and other times not.     Medications: reviewed  Labs: reviewed  Anthropometrics:   Weight decreased to 181 lb 4 oz today from 189 lb on 10/1.    5% total weight loss in little over 1 months   NUTRITION DIAGNOSIS: Inadequate oral intake continues due to nausea   INTERVENTION:  Discussed foods to help with nausea.  Fact sheet given.  Samples of ensure clear given.      MONITORING, EVALUATION, GOAL: Patient will consume  Adequate calories and protein during treatment to maintain weight and nutrition.   NEXT VISIT: to be determined  Wylie Russon B. Zenia Resides, [place], [place] Registered Dietitian [phone] (pager)

## 2018-06-26 NOTE — Progress Notes (Signed)
Went to infusion to intrduce myself as Arboriculturist and to discuss available resources. There are no foundations with copay assistance for her diagnosis.  Discussed the one-time $500 [place]. Advised this is based on household size and income. Left my card for any additional financial questions or concerns.

## 2018-06-27 ENCOUNTER — Ambulatory Visit
Admission: RE | Admit: 2018-06-27 | Discharge: 2018-06-27 | Disposition: A | Discharge status: Home / Self Care | Payer: BLUE CROSS/BLUE SHIELD | Source: Ambulatory Visit | Attending: Radiation Oncology | Admitting: Radiation Oncology

## 2018-06-27 DIAGNOSIS — C155 Malignant neoplasm of lower third of esophagus: Secondary | ICD-10-CM | POA: Diagnosis not present

## 2018-06-28 ENCOUNTER — Ambulatory Visit
Admission: RE | Admit: 2018-06-28 | Discharge: 2018-06-28 | Disposition: A | Discharge status: Home / Self Care | Payer: BLUE CROSS/BLUE SHIELD | Source: Ambulatory Visit | Attending: Radiation Oncology | Admitting: Radiation Oncology

## 2018-06-28 DIAGNOSIS — C155 Malignant neoplasm of lower third of esophagus: Secondary | ICD-10-CM | POA: Diagnosis not present

## 2018-06-29 ENCOUNTER — Ambulatory Visit
Admission: RE | Admit: 2018-06-29 | Discharge: 2018-06-29 | Disposition: A | Discharge status: Home / Self Care | Payer: BLUE CROSS/BLUE SHIELD | Source: Ambulatory Visit | Attending: Radiation Oncology | Admitting: Radiation Oncology

## 2018-06-29 DIAGNOSIS — C155 Malignant neoplasm of lower third of esophagus: Secondary | ICD-10-CM | POA: Diagnosis not present

## 2018-06-30 ENCOUNTER — Other Ambulatory Visit: Payer: Self-pay | Admitting: Oncology

## 2018-07-02 ENCOUNTER — Ambulatory Visit
Admission: RE | Admit: 2018-07-02 | Discharge: 2018-07-02 | Disposition: A | Discharge status: Home / Self Care | Payer: BLUE CROSS/BLUE SHIELD | Source: Ambulatory Visit | Attending: Radiation Oncology | Admitting: Radiation Oncology

## 2018-07-02 DIAGNOSIS — C155 Malignant neoplasm of lower third of esophagus: Secondary | ICD-10-CM | POA: Diagnosis not present

## 2018-07-03 ENCOUNTER — Ambulatory Visit
Admission: RE | Admit: 2018-07-03 | Discharge: 2018-07-03 | Disposition: A | Discharge status: Home / Self Care | Payer: BLUE CROSS/BLUE SHIELD | Source: Ambulatory Visit | Attending: Radiation Oncology | Admitting: Radiation Oncology

## 2018-07-03 ENCOUNTER — Other Ambulatory Visit: Payer: Self-pay | Admitting: Radiation Oncology

## 2018-07-03 ENCOUNTER — Telehealth: Payer: Self-pay | Admitting: *Deleted

## 2018-07-03 ENCOUNTER — Other Ambulatory Visit: Payer: Self-pay | Admitting: Emergency Medicine

## 2018-07-03 ENCOUNTER — Telehealth: Payer: Self-pay | Admitting: Emergency Medicine

## 2018-07-03 ENCOUNTER — Ambulatory Visit (HOSPITAL_COMMUNITY)
Admission: RE | Admit: 2018-07-03 | Discharge: 2018-07-03 | Disposition: A | Discharge status: Home / Self Care | Payer: BLUE CROSS/BLUE SHIELD | Source: Ambulatory Visit | Attending: Radiation Oncology | Admitting: Radiation Oncology

## 2018-07-03 ENCOUNTER — Inpatient Hospital Stay: Payer: BLUE CROSS/BLUE SHIELD

## 2018-07-03 ENCOUNTER — Inpatient Hospital Stay (HOSPITAL_BASED_OUTPATIENT_CLINIC_OR_DEPARTMENT_OTHER): Payer: BLUE CROSS/BLUE SHIELD | Admitting: Medical

## 2018-07-03 VITALS — BP 118/72 | HR 94 | Temp 98.4°F | Resp 18 | Ht 69.0 in | Wt 176.7 lb

## 2018-07-03 DIAGNOSIS — G43909 Migraine, unspecified, not intractable, without status migrainosus: Secondary | ICD-10-CM

## 2018-07-03 DIAGNOSIS — C160 Malignant neoplasm of cardia: Secondary | ICD-10-CM | POA: Diagnosis not present

## 2018-07-03 DIAGNOSIS — Z79899 Other long term (current) drug therapy: Secondary | ICD-10-CM

## 2018-07-03 DIAGNOSIS — R51 Headache: Secondary | ICD-10-CM | POA: Diagnosis present

## 2018-07-03 DIAGNOSIS — R197 Diarrhea, unspecified: Secondary | ICD-10-CM

## 2018-07-03 DIAGNOSIS — C155 Malignant neoplasm of lower third of esophagus: Secondary | ICD-10-CM

## 2018-07-03 DIAGNOSIS — R112 Nausea with vomiting, unspecified: Secondary | ICD-10-CM | POA: Diagnosis not present

## 2018-07-03 DIAGNOSIS — R519 Headache, unspecified: Secondary | ICD-10-CM

## 2018-07-03 DIAGNOSIS — Z5111 Encounter for antineoplastic chemotherapy: Secondary | ICD-10-CM | POA: Diagnosis not present

## 2018-07-03 DIAGNOSIS — F17210 Nicotine dependence, cigarettes, uncomplicated: Secondary | ICD-10-CM

## 2018-07-03 DIAGNOSIS — Z803 Family history of malignant neoplasm of breast: Secondary | ICD-10-CM

## 2018-07-03 LAB — CMP (CANCER CENTER ONLY)
ALBUMIN: 4.1 g/dL (ref 3.5–5.0)
ALT: 17 U/L (ref 0–44)
ANION GAP: 11 (ref 5–15)
AST: 16 U/L (ref 15–41)
Alkaline Phosphatase: 77 U/L (ref 38–126)
BILIRUBIN TOTAL: 0.7 mg/dL (ref 0.3–1.2)
BUN: 13 mg/dL (ref 6–20)
CHLORIDE: 102 mmol/L (ref 98–111)
CO2: 26 mmol/L (ref 22–32)
Calcium: 9.8 mg/dL (ref 8.9–10.3)
Creatinine: 0.86 mg/dL (ref 0.44–1.00)
GFR, Est AFR Am: >60 mL/min (ref >60)
Glucose, Bld: 106 mg/dL — ABNORMAL HIGH (ref 70–99)
POTASSIUM: 3.9 mmol/L (ref 3.5–5.1)
Sodium: 139 mmol/L (ref 135–145)
Total Protein: 7.4 g/dL (ref 6.5–8.1)

## 2018-07-03 LAB — CBC WITH DIFFERENTIAL (CANCER CENTER ONLY)
Abs Immature Granulocytes: 0 10*3/uL (ref 0.00–0.07)
Basophils Absolute: 0 10*3/uL (ref 0.0–0.1)
Basophils Relative: 2 %
EOS ABS: 0 10*3/uL (ref 0.0–0.5)
EOS PCT: 1 %
HCT: 39.3 % (ref 36.0–46.0)
Hemoglobin: 13.4 g/dL (ref 12.0–15.0)
Immature Granulocytes: 0 %
Lymphocytes Relative: 15 %
Lymphs Abs: 0.2 10*3/uL — ABNORMAL LOW (ref 0.7–4.0)
MCH: 28.5 pg (ref 26.0–34.0)
MCHC: 34.1 g/dL (ref 30.0–36.0)
MCV: 83.6 fL (ref 80.0–100.0)
MONO ABS: 0.3 10*3/uL (ref 0.1–1.0)
MONOS PCT: 21 %
NRBC: 0 % (ref 0.0–0.2)
Neutro Abs: 1 10*3/uL — ABNORMAL LOW (ref 1.7–7.7)
Neutrophils Relative %: 61 %
Platelet Count: 113 10*3/uL — ABNORMAL LOW (ref 150–400)
RBC: 4.7 MIL/uL (ref 3.87–5.11)
RDW: 13.5 % (ref 11.5–15.5)
WBC Count: 1.6 10*3/uL — ABNORMAL LOW (ref 4.0–10.5)

## 2018-07-03 LAB — MAGNESIUM: Magnesium: 1.7 mg/dL (ref 1.7–2.4)

## 2018-07-03 MED ORDER — ONDANSETRON HCL 4 MG/2ML IJ SOLN
8.0000 mg | Freq: Once | INTRAMUSCULAR | Status: AC
  Administered 2018-07-03: 8 mg via INTRAVENOUS

## 2018-07-03 MED ORDER — ONDANSETRON HCL 4 MG/2ML IJ SOLN: 8.0000 mg | Freq: Once | INTRAMUSCULAR | Status: DC

## 2018-07-03 MED ORDER — DEXAMETHASONE SODIUM PHOSPHATE 10 MG/ML IJ SOLN
10.0000 mg | Freq: Once | INTRAMUSCULAR | Status: AC
  Administered 2018-07-03: 10 mg via INTRAVENOUS

## 2018-07-03 MED ORDER — ONDANSETRON HCL 4 MG/2ML IJ SOLN
INTRAMUSCULAR | Status: AC
  Filled 2018-07-03: qty 4

## 2018-07-03 MED ORDER — GADOBUTROL 1 MMOL/ML IV SOLN
8.0000 mL | Freq: Once | INTRAVENOUS | Status: AC | PRN
  Administered 2018-07-03: 8 mL via INTRAVENOUS

## 2018-07-03 MED ORDER — DEXAMETHASONE SODIUM PHOSPHATE 10 MG/ML IJ SOLN
INTRAMUSCULAR | Status: AC
  Filled 2018-07-03: qty 1

## 2018-07-03 MED ORDER — SODIUM CHLORIDE 0.9 % IV SOLN: 10.0000 mg | Freq: Once | INTRAVENOUS | Status: DC

## 2018-07-03 MED ORDER — SODIUM CHLORIDE 0.9 % IV SOLN
Freq: Once | INTRAVENOUS | Status: AC
  Administered 2018-07-03: 11:00:00 via INTRAVENOUS
  Filled 2018-07-03: qty 250

## 2018-07-03 MED ORDER — DEXAMETHASONE 2 MG PO TABS: 2.0000 mg | ORAL_TABLET | Freq: Two times a day (BID) | ORAL | 0 refills | Status: DC

## 2018-07-03 MED ORDER — LORAZEPAM 0.5 MG PO TABS: 0.5000 mg | ORAL_TABLET | Freq: Four times a day (QID) | ORAL | 0 refills | Status: DC | PRN

## 2018-07-03 NOTE — Patient Instructions (Signed)
Dehydration, Adult Dehydration is when there is not enough fluid or water in your body. This happens when you lose more fluids than you take in. Dehydration can range from mild to very bad. It should be treated right away to keep it from getting very bad. Symptoms of mild dehydration may include:  Thirst.  Dry lips.  Slightly dry mouth.  Dry, warm skin.  Dizziness. Symptoms of moderate dehydration may include:  Very dry mouth.  Muscle cramps.  Dark pee (urine). Pee may be the color of tea.  Your body making less pee.  Your eyes making fewer tears.  Heartbeat that is uneven or faster than normal (palpitations).  Headache.  Light-headedness, especially when you stand up from sitting.  Fainting (syncope). Symptoms of very bad dehydration may include:  Changes in skin, such as: ? Cold and clammy skin. ? Blotchy (mottled) or pale skin. ? Skin that does not quickly return to normal after being lightly pinched and let go (poor skin turgor).  Changes in body fluids, such as: ? Feeling very thirsty. ? Your eyes making fewer tears. ? Not sweating when body temperature is high, such as in hot weather. ? Your body making very little pee.  Changes in vital signs, such as: ? Weak pulse. ? Pulse that is more than 100 beats a minute when you are sitting still. ? Fast breathing. ? Low blood pressure.  Other changes, such as: ? Sunken eyes. ? Cold hands and feet. ? Confusion. ? Lack of energy (lethargy). ? Trouble waking up from sleep. ? Short-term weight loss. ? Unconsciousness. Follow these instructions at home:  If told by your doctor, drink an ORS: ? Make an ORS by using instructions on the package. ? Start by drinking small amounts, about  cup (120 mL) every 5-10 minutes. ? Slowly drink more until you have had the amount that your doctor said to have.  Drink enough clear fluid to keep your pee clear or pale yellow. If you were told to drink an ORS, finish the ORS  first, then start slowly drinking clear fluids. Drink fluids such as: ? Water. Do not drink only water by itself. Doing that can make the salt (sodium) level in your body get too low (hyponatremia). ? Ice chips. ? Fruit juice that you have added water to (diluted). ? Low-calorie sports drinks.  Avoid: ? Alcohol. ? Drinks that have a lot of sugar. These include high-calorie sports drinks, fruit juice that does not have water added, and soda. ? Caffeine. ? Foods that are greasy or have a lot of fat or sugar.  Take over-the-counter and prescription medicines only as told by your doctor.  Do not take salt tablets. Doing that can make the salt level in your body get too high (hypernatremia).  Eat foods that have minerals (electrolytes). Examples include bananas, oranges, potatoes, tomatoes, and spinach.  Keep all follow-up visits as told by your doctor. This is important. Contact a doctor if:  You have belly (abdominal) pain that: ? Gets worse. ? Stays in one area (localizes).  You have a rash.  You have a stiff neck.  You get angry or annoyed more easily than normal (irritability).  You are more sleepy than normal.  You have a harder time waking up than normal.  You feel: ? Weak. ? Dizzy. ? Very thirsty.  You have peed (urinated) only a small amount of very dark pee during 6-8 hours. Get help right away if:  You have symptoms of   very bad dehydration.  You cannot drink fluids without throwing up (vomiting).  Your symptoms get worse with treatment.  You have a fever.  You have a very bad headache.  You are throwing up or having watery poop (diarrhea) and it: ? Gets worse. ? Does not go away.  You have blood or something green (bile) in your throw-up.  You have blood in your poop (stool). This may cause poop to look black and tarry.  You have not peed in 6-8 hours.  You pass out (faint).  Your heart rate when you are sitting still is more than 100 beats a  minute.  You have trouble breathing. This information is not intended to replace advice given to you by your health care provider. Make sure you discuss any questions you have with your health care provider. Document Released: 06/25/2009 Document Revised: 03/18/2016 Document Reviewed: 10/23/2015 Elsevier Interactive Patient Education  2018 Elsevier Inc.  

## 2018-07-03 NOTE — Telephone Encounter (Signed)
Called pt, no answer.  Left VM alerting pt to [facility] appt for IVF on 10/23.

## 2018-07-03 NOTE — Progress Notes (Signed)
The patient was seen today for a work in visit due to progressive nausea/vomiting. She is being treated for adenocarcinoma of the GE junction, and she has one fraction left of treatment.  She has been doing very well however has lost weight, and is currently 11 pounds down from 2 weeks ago, she is almost 20 pounds down from her initial consultation, and has been unable to keep down any solid food for more than a week.  She describes concentrated urine, mild headache, and no relief after emesis.  She denies any abdominal pain, and is passing gas, and having small infrequent bowel movements.  Vitals are being recorded by nursing.  Tachy on exam, BS x4, SNTND.   A/P: We will proceed with her last treatment tomorrow. I've discussed her case with Sandi Mealy, [facility] and will proceed with blood work, and IVF today. I recommended she have an MRI brain given that no other regimens have helped her symptoms. We will try dexamethasone and ativan and she is aware of the side effects and associated considerations with benzodiazepines as she's had intolerance in the past.      [name], [facility]

## 2018-07-03 NOTE — Telephone Encounter (Signed)
Called patient to inform of MRI for 07-03-18- arrival time - 4:30 pm @ [facility], no restrictions to test, spoke with patient and she is aware of this test

## 2018-07-03 NOTE — Progress Notes (Signed)
[facility] Progress Note   Rhonda Newton [id number] 01/10/1966 52 y.o.  Rhonda Newton is managed by Dr. Ladell Pier  Actively treated with chemotherapy/immunotherapy: yes  Current Therapy: Carboplatin and paclitaxel with concurrent radiation therapy  Last Treated: 06/26/2018 (cycle 1, day 29) 07/03/2018 radiation therapy  Assessment: Plan:    Intractable vomiting with nausea, unspecified vomiting type - Plan: 0.9 %  sodium chloride infusion, dexamethasone (DECADRON) injection 10 mg, ondansetron (ZOFRAN) injection 8 mg, DISCONTINUED: ondansetron (ZOFRAN) injection 8 mg, DISCONTINUED: dexamethasone (DECADRON) 10 mg in sodium chloride 0.9 % 50 mL IVPB  Diarrhea, unspecified type - Plan: Magnesium   Intractable vomiting with nausea: The patient was given 1 L of normal saline IV, Decadron 10 mg IV and Zofran 8 mg IV.  Her nausea was significantly improved.  The patient was able to drink warm chicken broth.  She was given a prescription earlier by radiation oncology for Decadron and Ativan.  She was instructed to begin these tonight.  Diarrhea: The patient was given 1 L of normal saline IV today.  Her labs returned with a magnesium of 1.7 and a potassium of 3.9.  Plans are for her to return to the [facility] tomorrow for consideration of additional IV fluids.  Please see After Visit Summary for patient specific instructions.  Future Appointments  Date Time Provider [place]  07/03/2018  5:00 PM [facility] 1 [facility]   07/04/2018  8:00 AM [facility] 4 [facility] None  07/04/2018  9:15 AM Owens Shark, NP [facility] None  07/04/2018 10:00 AM [facility] 2 [facility] None  07/12/2018  2:00 PM [name], MD [facility]  08/20/2018  9:30 AM Hayden Pedro, PA-C [facility] None    Orders Placed This Encounter  Procedures  . Magnesium       Subjective:   Patient ID:  [name] is a  53 y.o. (DOB 01-09-1966) female.  Chief Complaint:  Chief Complaint  Patient presents with  . 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 Dominica Severin B. Sherrill. She is currently treated with XRT which she will complete tomorrow. She began chemotherapy with carboplatin/Taxol which was dosed on 05/29/2018.  Rhonda Newton has developed intractable nausea and vomiting and is being seen in clinic today for antiemetic and possible IV fluids. Rhonda Newton has had an 11 pound weight loss over the last 2 weeks. She has lost a total of 20 pounds since she was first seen. She has been unable to keep solids down for 1 week.  She reports that she has had anorexia, has been sleeping a lot, and has had chills.  She denies fevers.  She was given a prescription for Ativan and dexamethasone for her nausea earlier today by radiation oncology.  She has been referred for an MRI of the brain by radiation oncology.  This is to be completed this afternoon.  Medications: I have reviewed the patient's current medications.  Allergies:  Allergies  Allergen Reactions  . Benzodiazepines     Clouding of cognition and disinhibition    Past Medical History:  Diagnosis Date  . Anxiety   . Anxiety and depression   . Barrett's esophagus 05/18/2018  . Depression   . Family history of breast cancer   . Family history of colon cancer   . Family history of melanoma   . Family history of prostate cancer   .  GE junction carcinoma ([place]) 05/18/2018    Past Surgical History:  Procedure Laterality Date  . cataracts Bilateral    age 74  . INTRAUTERINE DEVICE INSERTION     Paraguard    Family History  Problem Relation Age of Onset  . Barrett's esophagus Mother   . Hypertension Mother   . Breast cancer Mother 94  . Breast cancer Newton 44  . Breast cancer Maternal Aunt 73  . Barrett's esophagus Maternal Aunt   . Breast cancer Paternal Aunt        dx in her 70s  . Heart  disease Father   . Prostate cancer Maternal Uncle        dx in his mid 30s  . COPD Paternal Uncle   . Heart disease Maternal Grandmother   . Heart disease Maternal Grandfather   . Congestive Heart Failure Paternal Grandmother   . Dementia Paternal Grandmother   . Kidney cancer Paternal Grandfather   . Melanoma Cousin        mat first cousin; dx 24s-40s  . Prostate cancer Cousin        mat first cousin dx in his mid 51s  . Breast cancer Other        Rhonda Newton  . Colon cancer Other        Rhonda Newton  . Pancreatic cancer Other        Rhonda Newton    Social History   Socioeconomic History  . Marital status: Divorced    Spouse name: Not on file  . Number of children: Not on file  . Years of education: Not on file  . Highest education level: Not on file  Occupational History  . Not on file  Social Needs  . Financial resource strain: Not on file  . Food insecurity:    Worry: Not on file    Inability: Not on file  . Transportation needs:    Medical: Not on file    Non-medical: Not on file  Tobacco Use  . Smoking status: Current Every Day Smoker    Packs/day: 1.00    Types: Cigarettes  . Smokeless tobacco: Never Used  Substance and Sexual Activity  . Alcohol use: No    Comment: occasional  . Drug use: Not Currently    Types: Marijuana  . Sexual activity: Yes    Birth control/protection: IUD  Lifestyle  . Physical activity:    Days per week: Not on file    Minutes per session: Not on file  . Stress: Not on file  Relationships  . Social connections:    Talks on phone: Not on file    Gets together: Not on file    Attends religious service: Not on file    Active member of club or organization: Not on file    Attends meetings of clubs or organizations: Not on file    Relationship status: Not on file  . Intimate partner violence:    Fear of current or ex partner: Not on file    Emotionally abused: Not on file    Physically abused: Not on file    Forced sexual  activity: Not on file  Other Topics Concern  . Not on file  Social History Narrative  . Not on file    Past Medical History, Surgical history, Social history, and Family history were reviewed and updated as appropriate.   Please see review of systems for further details on the patient's review from today.  Review of Systems:  Review of Systems  Constitutional: Positive for appetite change. Negative for chills, diaphoresis and fever.  HENT: Positive for trouble swallowing.   Respiratory: Negative for cough, choking, shortness of breath and wheezing.   Cardiovascular: Negative for chest pain and palpitations.  Gastrointestinal: Positive for nausea and vomiting. Negative for constipation and diarrhea.  Genitourinary: Negative for decreased urine volume.  Neurological: Negative for headaches.  Psychiatric/Behavioral: Positive for sleep disturbance.    Objective:   Physical Exam:  BP 118/72 (BP Location: Right Arm, Patient Position: Sitting)   Pulse 94   Temp 98.4 F (36.9 C) (Oral)   Resp 18   Ht 5\' 9"  (1.753 m)   Wt 176 lb 11.2 oz (80.2 kg)   SpO2 100%   BMI 26.09 kg/m  ECOG: 0  Physical Exam  Constitutional: No distress.  HENT:  Head: Normocephalic and atraumatic.  Mouth/Throat: No oropharyngeal exudate.  Oral mucosa appears dry.  Cardiovascular: Normal rate, regular rhythm and normal heart sounds. Exam reveals no gallop and no friction rub.  No murmur heard. Pulmonary/Chest: Effort normal and breath sounds normal. No stridor. No respiratory distress. She has no wheezes. She has no rales.  Abdominal: Soft. Bowel sounds are normal. She exhibits no distension and no mass. There is no tenderness. There is no rebound and no guarding.  Neurological: She is alert. Coordination normal.  Skin: Skin is warm and dry. She is not diaphoretic.  Psychiatric: She has a normal mood and affect. Her behavior is normal. Judgment and thought content normal.    Lab Review:       Component Value Date/Time   NA 139 07/03/2018 0948   K 3.9 07/03/2018 0948   CL 102 07/03/2018 0948   CO2 26 07/03/2018 0948   GLUCOSE 106 (H) 07/03/2018 0948   BUN 13 07/03/2018 0948   CREATININE 0.86 07/03/2018 0948   CALCIUM 9.8 07/03/2018 0948   PROT 7.4 07/03/2018 0948   ALBUMIN 4.1 07/03/2018 0948   AST 16 07/03/2018 0948   ALT 17 07/03/2018 0948   ALKPHOS 77 07/03/2018 0948   BILITOT 0.7 07/03/2018 0948   GFRNONAA >60 07/03/2018 0948   GFRAA >60 07/03/2018 0948       Component Value Date/Time   WBC 1.6 (L) 07/03/2018 0948   RBC 4.70 07/03/2018 0948   HGB 13.4 07/03/2018 0948   HCT 39.3 07/03/2018 0948   PLT 113 (L) 07/03/2018 0948   MCV 83.6 07/03/2018 0948   MCH 28.5 07/03/2018 0948   MCHC 34.1 07/03/2018 0948   RDW 13.5 07/03/2018 0948   LYMPHSABS 0.2 (L) 07/03/2018 0948   MONOABS 0.3 07/03/2018 0948   EOSABS 0.0 07/03/2018 0948   BASOSABS 0.0 07/03/2018 0948   -------------------------------  Imaging from last 24 hours (if applicable):  Radiology interpretation: No results found.      This case was discussed with Dr. Benay Spice. He expressed agreement with my management of this patient.

## 2018-07-04 ENCOUNTER — Telehealth: Payer: Self-pay | Admitting: Radiation Oncology

## 2018-07-04 ENCOUNTER — Encounter: Payer: Self-pay | Admitting: Radiation Oncology

## 2018-07-04 ENCOUNTER — Inpatient Hospital Stay (HOSPITAL_BASED_OUTPATIENT_CLINIC_OR_DEPARTMENT_OTHER): Payer: BLUE CROSS/BLUE SHIELD | Admitting: Nurse Practitioner

## 2018-07-04 ENCOUNTER — Encounter: Payer: Self-pay | Admitting: Nurse Practitioner

## 2018-07-04 ENCOUNTER — Ambulatory Visit
Admission: RE | Admit: 2018-07-04 | Discharge: 2018-07-04 | Disposition: A | Discharge status: Home / Self Care | Payer: BLUE CROSS/BLUE SHIELD | Source: Ambulatory Visit | Attending: Radiation Oncology | Admitting: Radiation Oncology

## 2018-07-04 ENCOUNTER — Inpatient Hospital Stay: Payer: BLUE CROSS/BLUE SHIELD

## 2018-07-04 VITALS — BP 117/65 | HR 78 | Temp 97.9°F | Resp 18 | Ht 69.0 in | Wt 177.0 lb

## 2018-07-04 VITALS — BP 111/57 | HR 82 | Resp 18

## 2018-07-04 DIAGNOSIS — C155 Malignant neoplasm of lower third of esophagus: Secondary | ICD-10-CM | POA: Diagnosis not present

## 2018-07-04 DIAGNOSIS — Z79899 Other long term (current) drug therapy: Secondary | ICD-10-CM

## 2018-07-04 DIAGNOSIS — C160 Malignant neoplasm of cardia: Secondary | ICD-10-CM | POA: Diagnosis not present

## 2018-07-04 DIAGNOSIS — Z5111 Encounter for antineoplastic chemotherapy: Secondary | ICD-10-CM | POA: Diagnosis not present

## 2018-07-04 MED ORDER — SODIUM CHLORIDE 0.9 % IV SOLN
Freq: Once | INTRAVENOUS | Status: AC
  Administered 2018-07-04: 10:00:00 via INTRAVENOUS
  Filled 2018-07-04: qty 250

## 2018-07-04 NOTE — Progress Notes (Addendum)
[place] OFFICE PROGRESS NOTE   Diagnosis:  Gastroesophageal cancer  INTERVAL HISTORY:   Ms. Marshman returns as scheduled.  She completed cycle 5 weekly carboplatin/Taxol 06/26/2018.  She completed radiation treatment earlier today.  She was seen in the [facility] yesterday with nausea/vomiting and diarrhea.  She received IV fluids.  She was referred for a brain MRI yesterday which was negative.  The nausea and diarrhea have improved.  She was able to tolerate a bagel, apple juice and water this morning.  She had a "semisolid" bowel movement last night.  She had a headache last night, no headache at present.  She has occasional pain with swallowing.  This seems to be related to the temperature of food.  No significant dysphagia.  She notes her hair is thinning.  Objective:  Vital signs in last 24 hours:  Blood pressure 117/65, pulse 78, temperature 97.9 F (36.6 C), temperature source Oral, resp. rate 18, height '5\' 9"'  (1.753 m), weight 177 lb (80.3 kg), SpO2 100 %.    HEENT: No thrush or ulcers. Resp: Lungs clear bilaterally. Cardio: Regular rate and rhythm. GI: Abdomen soft and nontender.  No hepatomegaly. Vascular: No leg edema.  Lab Results:  Lab Results  Component Value Date   WBC 1.6 (L) 07/03/2018   HGB 13.4 07/03/2018   HCT 39.3 07/03/2018   MCV 83.6 07/03/2018   PLT 113 (L) 07/03/2018   NEUTROABS 1.0 (L) 07/03/2018    Imaging:  Mr Jeri Cos HA Contrast  Result Date: 07/03/2018 CLINICAL DATA:  Intractable nausea, vomiting and headache. Assess for metastatic disease. History of esophageal tumor. EXAM: MRI HEAD WITHOUT AND WITH CONTRAST TECHNIQUE: Multiplanar, multiecho pulse sequences of the brain and surrounding structures were obtained without and with intravenous contrast. CONTRAST:  8 cc Gadavist COMPARISON:  PET CT May 18, 2018 FINDINGS: Mild motion degraded examination. INTRACRANIAL CONTENTS: No reduced diffusion to suggest  acute ischemia or hypercellular tumor. No susceptibility artifact to suggest hemorrhage. The ventricles and sulci are normal for patient's age. No suspicious parenchymal signal, masses, mass effect. No abnormal intraparenchymal or extra-axial enhancement. No abnormal extra-axial fluid collections. No extra-axial masses. VASCULAR: Normal major intracranial vascular flow voids present at skull base. SKULL AND UPPER CERVICAL SPINE: No abnormal sellar expansion. No suspicious calvarial bone marrow signal. Craniocervical junction maintained. SINUSES/ORBITS: Small RIGHT maxillary mucosal retention cyst. Mastoid air cells are well aerated. Included ocular globes and orbital contents are non-suspicious. Status post bilateral ocular lens implants. OTHER: None. IMPRESSION: 1. Negative mildly motion degraded MRI head with and without contrast. 2. These results will be called to the ordering clinician or representative by the Radiology Department at the imaging location. Electronically Signed   By: Elon Alas M.D.   On: 07/03/2018 17:56    Medications: I have reviewed the patient's current medications.  Assessment/Plan: 1. Gastroesophageal carcinoma ? 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 CTs 05/04/2018, distal esophagus mass extending to to below the GE junction, no evidence of metastatic disease, 4 mm nonspecific right upper lobe nodule ? Upper EUS 05/11/2018-large fungating ulcerating mass in the distal esophagus about 34 to 38 cm from the incisors, tumor extends to the GE junction; stagedT3 N1 MX ? Radiation 05/28/2018-07/04/2018 ? Cycle 1weeklycarboplatin/Taxol 05/29/2018 ? Cycle 2weeklycarboplatin/Taxol 06/06/2018 ? Cycle 3 weekly carboplatin/Taxol 06/12/2018 ? Cycle 4 weekly carboplatin/Taxol 06/20/2018 ? Cycle 5 weekly carboplatin/Taxol  06/26/2018 2. Migraine headaches 3. Multiple family members with breast cancer 4. Tobacco use  Disposition: Ms. Cowdrey appears stable.  She has completed the course of neoadjuvant chemo/radiation.  The nausea/vomiting/diarrhea she has been experiencing recently has improved.  She noted significant benefit from the IV fluids yesterday and would like to receive another liter today.  She will take dexamethasone as previously prescribed today and tomorrow and then discontinue.  She understands to contact the office if symptoms recur.    She has an appointment with Dr. Elenor Quinones at [facility] next week for a second surgical opinion.    We reviewed the CBC from yesterday.  She has mild neutropenia.  She understands to contact the office with fever, chills, other signs of infection.    We will plan to see her back the week of 07/16/2018 with a repeat CBC.  Patient seen with Dr. Benay Spice.  Ned Card ANP/GNP-BC   07/04/2018  9:09 AM  This was a shared visit with Ned Card.  Ms. Cura appears well today.  She is recovering from nausea, likely secondary to radiation esophagitis/gastritis.  She will discontinue Decadron after tomorrow.  She will be scheduled for an office visit in 2 weeks.  She will contact us in the interim for recurrent nausea or symptoms of an infection.  Julieanne Manson, MD

## 2018-07-04 NOTE — Patient Instructions (Signed)
Dehydration, Adult Dehydration is when there is not enough fluid or water in your body. This happens when you lose more fluids than you take in. Dehydration can range from mild to very bad. It should be treated right away to keep it from getting very bad. Symptoms of mild dehydration may include:  Thirst.  Dry lips.  Slightly dry mouth.  Dry, warm skin.  Dizziness. Symptoms of moderate dehydration may include:  Very dry mouth.  Muscle cramps.  Dark pee (urine). Pee may be the color of tea.  Your body making less pee.  Your eyes making fewer tears.  Heartbeat that is uneven or faster than normal (palpitations).  Headache.  Light-headedness, especially when you stand up from sitting.  Fainting (syncope). Symptoms of very bad dehydration may include:  Changes in skin, such as: ? Cold and clammy skin. ? Blotchy (mottled) or pale skin. ? Skin that does not quickly return to normal after being lightly pinched and let go (poor skin turgor).  Changes in body fluids, such as: ? Feeling very thirsty. ? Your eyes making fewer tears. ? Not sweating when body temperature is high, such as in hot weather. ? Your body making very little pee.  Changes in vital signs, such as: ? Weak pulse. ? Pulse that is more than 100 beats a minute when you are sitting still. ? Fast breathing. ? Low blood pressure.  Other changes, such as: ? Sunken eyes. ? Cold hands and feet. ? Confusion. ? Lack of energy (lethargy). ? Trouble waking up from sleep. ? Short-term weight loss. ? Unconsciousness. Follow these instructions at home:  If told by your doctor, drink an ORS: ? Make an ORS by using instructions on the package. ? Start by drinking small amounts, about  cup (120 mL) every 5-10 minutes. ? Slowly drink more until you have had the amount that your doctor said to have.  Drink enough clear fluid to keep your pee clear or pale yellow. If you were told to drink an ORS, finish the ORS  first, then start slowly drinking clear fluids. Drink fluids such as: ? Water. Do not drink only water by itself. Doing that can make the salt (sodium) level in your body get too low (hyponatremia). ? Ice chips. ? Fruit juice that you have added water to (diluted). ? Low-calorie sports drinks.  Avoid: ? Alcohol. ? Drinks that have a lot of sugar. These include high-calorie sports drinks, fruit juice that does not have water added, and soda. ? Caffeine. ? Foods that are greasy or have a lot of fat or sugar.  Take over-the-counter and prescription medicines only as told by your doctor.  Do not take salt tablets. Doing that can make the salt level in your body get too high (hypernatremia).  Eat foods that have minerals (electrolytes). Examples include bananas, oranges, potatoes, tomatoes, and spinach.  Keep all follow-up visits as told by your doctor. This is important. Contact a doctor if:  You have belly (abdominal) pain that: ? Gets worse. ? Stays in one area (localizes).  You have a rash.  You have a stiff neck.  You get angry or annoyed more easily than normal (irritability).  You are more sleepy than normal.  You have a harder time waking up than normal.  You feel: ? Weak. ? Dizzy. ? Very thirsty.  You have peed (urinated) only a small amount of very dark pee during 6-8 hours. Get help right away if:  You have symptoms of   very bad dehydration.  You cannot drink fluids without throwing up (vomiting).  Your symptoms get worse with treatment.  You have a fever.  You have a very bad headache.  You are throwing up or having watery poop (diarrhea) and it: ? Gets worse. ? Does not go away.  You have blood or something green (bile) in your throw-up.  You have blood in your poop (stool). This may cause poop to look black and tarry.  You have not peed in 6-8 hours.  You pass out (faint).  Your heart rate when you are sitting still is more than 100 beats a  minute.  You have trouble breathing. This information is not intended to replace advice given to you by your health care provider. Make sure you discuss any questions you have with your health care provider. Document Released: 06/25/2009 Document Revised: 03/18/2016 Document Reviewed: 10/23/2015 Elsevier Interactive Patient Education  2018 Elsevier Inc.  

## 2018-07-04 NOTE — Telephone Encounter (Signed)
LM for pt that her MRI was negative for disease. She noticed improvement in her nausea following fluids and antiemetics yesterday per notes. I let her know to continue her dexamethasone and prn ativan and we could revisit this today if needed.

## 2018-07-05 ENCOUNTER — Telehealth: Payer: Self-pay | Admitting: Nurse Practitioner

## 2018-07-05 NOTE — Telephone Encounter (Signed)
Scheduled appt per 10/23 los - pt is aware of appt date and time

## 2018-07-06 ENCOUNTER — Ambulatory Visit: Payer: Self-pay | Admitting: Genetic Counselor

## 2018-07-06 ENCOUNTER — Encounter: Payer: Self-pay | Admitting: Genetic Counselor

## 2018-07-06 ENCOUNTER — Telehealth: Payer: Self-pay | Admitting: Genetic Counselor

## 2018-07-06 DIAGNOSIS — Z1379 Encounter for other screening for genetic and chromosomal anomalies: Secondary | ICD-10-CM

## 2018-07-06 NOTE — Telephone Encounter (Signed)
Revealed negative genetic testing.  Discussed that we do not know why she has stomach cancer or why there is cancer in the family. It could be due to a different gene that we are not testing, or maybe our current technology may not be able to pick something up.  It will be important for her to keep in contact with genetics to keep up with whether additional testing may be needed.  The patient is placed at a 30-31% risk for breast cancer based on a [employer].  We will refer to high risk, but patient wants to wait until she is completed with treatment.

## 2018-07-06 NOTE — Progress Notes (Signed)
HPI:  Rhonda Newton was previously seen in the [place] clinic due to a personal and family history of cancer and concerns regarding a hereditary predisposition to cancer. Please refer to our prior cancer genetics clinic note for more information regarding Rhonda Newton's medical, social and family histories, and our assessment and recommendations, at the time. Rhonda Newton recent genetic test results were disclosed to her, as were recommendations warranted by these results. These results and recommendations are discussed in more detail below.  CANCER HISTORY:    Malignant neoplasm of lower third of esophagus ([place])   05/16/2018 Initial Diagnosis    Malignant neoplasm of lower third of esophagus ([place])    05/29/2018 Cancer Staging    Staging form: Esophagus - Adenocarcinoma, AJCC 8th Edition - Clinical: Stage III (cT3, cN1, cM0, G3) - Signed by [name], MD on 05/29/2018    05/29/2018 -  Chemotherapy    The patient had palonosetron (ALOXI) injection 0.25 mg, 0.25 mg, Intravenous,  Once, 1 of 1 cycle Administration: 0.25 mg (05/29/2018), 0.25 mg (06/06/2018), 0.25 mg (06/12/2018), 0.25 mg (06/20/2018), 0.25 mg (06/26/2018) CARBOplatin (PARAPLATIN) 250 mg in sodium chloride 0.9 % 250 mL chemo infusion, 250 mg (100 % of original dose 250 mg), Intravenous,  Once, 1 of 1 cycle Dose modification: 250 mg (original dose 250 mg, Cycle 1), 250 mg (original dose 250 mg, Cycle 1), 250 mg (original dose 250 mg, Cycle 1) Administration: 250 mg (05/29/2018), 250 mg (06/06/2018), 250 mg (06/12/2018), 250 mg (06/20/2018), 250 mg (06/26/2018) PACLitaxel (TAXOL) 102 mg in sodium chloride 0.9 % 250 mL chemo infusion (</= 18m/m2), 50 mg/m2 = 102 mg, Intravenous,  Once, 1 of 1 cycle Administration: 102 mg (05/29/2018), 102 mg (06/06/2018), 102 mg (06/12/2018), 102 mg (06/20/2018), 102 mg (06/26/2018)  for chemotherapy treatment.     07/03/2018 Genetic Testing    Negative genetic testing on the  CancerNext-Expanded+RNAinsight panel performed by [employer] The CancerNext-Expanded gene panel [facility] includes sequencing and rearrangement analysis for the following 67 genes: AIP, ALK, APC*, ATM*, BAP1, BARD1, BLM, BMPR1A, BRCA1*, BRCA2*, BRIP1*, CDH1*, CDK4, CDKN1B, CDKN2A, CHEK2*, DICER1, FANCC, FH, FLCN, GALNT12, HOXB13, MAX, MEN1, MET, MLH1*, MRE11A, MSH2*, MSH6*, MUTYH*, NBN, NF1*, NF2, PALB2*, PHOX2B, PMS2*, POLD1, POLE, POT1, PRKAR1A, PTCH1, PTEN*, RAD50, RAD51C*, RAD51D*, RB1, RET, SDHA, SDHAF2, SDHB, SDHC, SDHD, SMAD4, SMARCA4, SMARCB1, SMARCE1, STK11, SUFU, TMEM127, TP53*, TSC1, TSC2, VHL and XRCC2 (sequencing and deletion/duplication); MITF (sequencing only); EPCAM and GREM1 (deletion/duplication only). DNA and RNA analyses performed for * genes. The report date is July 05, 2018.     FAMILY HISTORY:  We obtained a detailed, 4-generation family history.  Significant diagnoses are listed below: Family History  Problem Relation Age of Onset  . Barrett's esophagus Mother   . Hypertension Mother   . Breast cancer Mother 611 . Breast cancer Sister 572 . Breast cancer Maternal Aunt 73  . Barrett's esophagus Maternal Aunt   . Breast cancer Paternal Aunt        dx in her 630s . Heart disease Father   . Prostate cancer Maternal Uncle        dx in his mid 542s . COPD Paternal Uncle   . Heart disease Maternal Grandmother   . Heart disease Maternal Grandfather   . Congestive Heart Failure Paternal Grandmother   . Dementia Paternal Grandmother   . Kidney cancer Paternal Grandfather   . Melanoma Cousin  mat first cousin; dx 30s-40s  . Prostate cancer Cousin        mat first cousin dx in his mid 49s  . Breast cancer Other        PGMs sister  . Colon cancer Other        PGMs sister  . Pancreatic cancer Other        MGFs sister    The patient has one son who is cancer free.  She has a sister who had breast cancer at 86.  Both parents are  living.  The patient's mother had breast cancer at 66.  She underwent genetic testing for 17 breast cancer genes and was negative.  She has an identical twin sister who had breast cancer at 71.  Her son had melanoma in his 66's-40's.  She also had a brother who had prostate cancer in his 51's and died at 21.  His son also had prostate cancer in his 21's.  The maternal grandparents are deceased. The grandfather had heart disease.  He had a sister with pancreatic cancer and a brother with bone cancer.  The patient's father is living.  He had two brothers and a sister.  The sister had breast cancer in her 60's, one brother died as an infant and the other brother died of COPD.  The paternal grandparents are deceased. The grandfather had kidney cancer and his brother had lung cancer.  The grandmother had heart failure, and had two sisters, one with breast cancer and the other with colon.  Rhonda Newton is aware of previous family history of genetic testing for hereditary cancer risks. Patient's maternal ancestors are of [place] descent, and paternal ancestors are of [place], [place] and [place] descent. There is no reported Ashkenazi Jewish ancestry. There is no known consanguinity.  GENETIC TEST RESULTS: Genetic testing reported out on July 05, 2018 through the CancerNext-Expanded +RNAinsight cancer panel found no deleterious mutations.  The CancerNext-Expanded gene panel offered by [facility] and includes sequencing and rearrangement analysis for the following 67 genes: AIP, ALK, APC*, ATM*, BAP1, BARD1, BLM, BMPR1A, BRCA1*, BRCA2*, BRIP1*, CDH1*, CDK4, CDKN1B, CDKN2A, CHEK2*, DICER1, FANCC, FH, FLCN, GALNT12, HOXB13, MAX, MEN1, MET, MLH1*, MRE11A, MSH2*, MSH6*, MUTYH*, NBN, NF1*, NF2, PALB2*, PHOX2B, PMS2*, POLD1, POLE, POT1, PRKAR1A, PTCH1, PTEN*, RAD50, RAD51C*, RAD51D*, RB1, RET, SDHA, SDHAF2, SDHB, SDHC, SDHD, SMAD4, SMARCA4, SMARCB1, SMARCE1, STK11, SUFU, TMEM127, TP53*, TSC1, TSC2, VHL  and XRCC2 (sequencing and deletion/duplication); MITF (sequencing only); EPCAM and GREM1 (deletion/duplication only). DNA and RNA analyses performed for * genes.  The test report has been scanned into EPIC and is located under the Molecular Pathology section of the Results Review tab.    We discussed with Rhonda Newton that since the current genetic testing is not perfect, it is possible there may be a gene mutation in one of these genes that current testing cannot detect, but that chance is small.  We also discussed, that it is possible that another gene that has not yet been discovered, or that we have not yet tested, is responsible for the cancer diagnoses in the family, and it is, therefore, important to remain in touch with cancer genetics in the future so that we can continue to offer Rhonda Newton the most up to date genetic testing.    CANCER SCREENING RECOMMENDATIONS:  This result is reassuring and indicates that Rhonda Newton likely does not have an increased risk for a future cancer due to a mutation in one of these genes.  This normal test also suggests that Rhonda Newton's cancer was most likely not due to an inherited predisposition associated with one of these genes.  Most cancers happen by chance and this negative test suggests that her cancer falls into this category.  We, therefore, recommended she continue to follow the cancer management and screening guidelines provided by her oncology and primary healthcare provider.   An individual's cancer risk and medical management are not determined by genetic test results alone. Overall cancer risk assessment incorporates additional factors, including personal medical history, family history, and any available genetic information that may result in a personalized plan for cancer prevention and surveillance.  An individual's cancer risk and medical management are not determined by genetic test results alone. Overall cancer risk assessment incorporates  additional factors, including personal medical history, family history, and any available genetic information that may result in a personalized plan for cancer prevention and surveillance.  Based on Rhonda Newton's personal and family history of cancer, as well as her genetic test results, statistical models Harriett Rush)  and literature data were used to estimate her risk of developing breast cancer. These estimate her lifetime risk of developing breast cancer to be approximately 30.7%.  The patient's lifetime breast cancer risk is a preliminary estimate based on available information using one of several models endorsed by the [place] (ACS). The ACS recommends consideration of breast MRI screening as an adjunct to mammography for patients at high risk (defined as 20% or greater lifetime risk). A more detailed breast cancer risk assessment can be considered, if clinically indicated.   Rhonda Newton has been determined to be at high risk for breast cancer.  Therefore, we recommend that annual screening with mammography and breast MRI begin at age 37, or 10 years prior to the age of breast cancer diagnosis in a relative (whichever is earlier).  We discussed that Rhonda Newton should discuss her individual situation with her referring physician and determine a breast cancer screening plan with which they are both comfortable.    We, therefore, discussed that it is reasonable for Rhonda Newton to be followed by a high-risk breast cancer clinic; in addition to a yearly mammogram and physical exam by a healthcare provider, she should discuss the usefulness of an annual breast MRI with the high-risk clinic providers.   RECOMMENDATIONS FOR FAMILY MEMBERS:  Women in this family might be at some increased risk of developing cancer, over the general population risk, simply due to the family history of cancer.  We recommended women in this family have a yearly mammogram beginning at age 58, or 68 years younger  than the earliest onset of cancer, an annual clinical breast exam, and perform monthly breast self-exams. Women in this family should also have a gynecological exam as recommended by their primary provider. All family members should have a colonoscopy by age 26.  FOLLOW-UP: Lastly, we discussed with Rhonda Newton that cancer genetics is a rapidly advancing field and it is possible that new genetic tests will be appropriate for her and/or her family members in the future. We encouraged her to remain in contact with cancer genetics on an annual basis so we can update her personal and family histories and let her know of advances in cancer genetics that may benefit this family.   Our contact number was provided. Rhonda Newton questions were answered to her satisfaction, and she knows she is welcome to call us at anytime with additional questions or concerns.   Santiago Glad  [place], [place], [facility] Certified Genetic Counselor [email or web address]

## 2018-07-11 ENCOUNTER — Telehealth: Payer: Self-pay | Admitting: *Deleted

## 2018-07-11 NOTE — Telephone Encounter (Signed)
Patient called with concerns of nausea and vomiting. Patient states it started on Tuesday evening. She has not taken any antiemetics. Advised patient to take her anti nausea medications. She reports they don't "work" but they knock her out. She is going to try the phenergan and if that does not help she will call the office back. She declines coming into the office right now but will consider it tomorrow if she does not feel better. Patient is instructed to call this office if symptoms worsen.

## 2018-07-12 ENCOUNTER — Ambulatory Visit: Payer: BLUE CROSS/BLUE SHIELD | Admitting: Cardiothoracic Surgery

## 2018-07-20 ENCOUNTER — Inpatient Hospital Stay: Payer: BLUE CROSS/BLUE SHIELD | Attending: Oncology | Admitting: Oncology

## 2018-07-20 ENCOUNTER — Telehealth: Payer: Self-pay | Admitting: Oncology

## 2018-07-20 ENCOUNTER — Inpatient Hospital Stay: Payer: BLUE CROSS/BLUE SHIELD

## 2018-07-20 VITALS — BP 116/75 | HR 70 | Temp 97.8°F | Resp 18 | Ht 69.0 in | Wt 175.6 lb

## 2018-07-20 DIAGNOSIS — Z923 Personal history of irradiation: Secondary | ICD-10-CM | POA: Diagnosis not present

## 2018-07-20 DIAGNOSIS — Z720 Tobacco use: Secondary | ICD-10-CM | POA: Insufficient documentation

## 2018-07-20 DIAGNOSIS — G43909 Migraine, unspecified, not intractable, without status migrainosus: Secondary | ICD-10-CM

## 2018-07-20 DIAGNOSIS — Z803 Family history of malignant neoplasm of breast: Secondary | ICD-10-CM | POA: Insufficient documentation

## 2018-07-20 DIAGNOSIS — C160 Malignant neoplasm of cardia: Secondary | ICD-10-CM | POA: Insufficient documentation

## 2018-07-20 DIAGNOSIS — C155 Malignant neoplasm of lower third of esophagus: Secondary | ICD-10-CM

## 2018-07-20 DIAGNOSIS — Z9221 Personal history of antineoplastic chemotherapy: Secondary | ICD-10-CM | POA: Diagnosis not present

## 2018-07-20 LAB — CBC WITH DIFFERENTIAL (CANCER CENTER ONLY)
Abs Immature Granulocytes: 0.01 10*3/uL (ref 0.00–0.07)
BASOS ABS: 0 10*3/uL (ref 0.0–0.1)
Basophils Relative: 1 %
EOS ABS: 0.1 10*3/uL (ref 0.0–0.5)
EOS PCT: 2 %
HEMATOCRIT: 35.5 % — AB (ref 36.0–46.0)
Hemoglobin: 11.6 g/dL — ABNORMAL LOW (ref 12.0–15.0)
Immature Granulocytes: 0 %
LYMPHS ABS: 0.6 10*3/uL — AB (ref 0.7–4.0)
Lymphocytes Relative: 22 %
MCH: 29.5 pg (ref 26.0–34.0)
MCHC: 32.7 g/dL (ref 30.0–36.0)
MCV: 90.3 fL (ref 80.0–100.0)
MONO ABS: 0.5 10*3/uL (ref 0.1–1.0)
Monocytes Relative: 18 %
NRBC: 0 % (ref 0.0–0.2)
Neutro Abs: 1.5 10*3/uL — ABNORMAL LOW (ref 1.7–7.7)
Neutrophils Relative %: 57 %
Platelet Count: 179 10*3/uL (ref 150–400)
RBC: 3.93 MIL/uL (ref 3.87–5.11)
RDW: 17.9 % — AB (ref 11.5–15.5)
WBC: 2.7 10*3/uL — AB (ref 4.0–10.5)

## 2018-07-20 NOTE — Progress Notes (Signed)
  [place] OFFICE PROGRESS NOTE   Diagnosis: Gastroesophageal cancer  INTERVAL HISTORY:   Rhonda Newton reports feeling well.  No nausea or dysphasia.  She is eating a regular diet.  She saw Dr. Otilio Connors on 07/10/2018.  She is scheduled for a follow-up visit and preoperative staging on 08/16/2018.  She is scheduled for surgery on 08/17/2018.  Objective:  Vital signs in last 24 hours:  Blood pressure 116/75, pulse 70, temperature 97.8 F (36.6 C), temperature source Oral, resp. rate 18, height '5\' 9"'$  (1.753 m), weight 175 lb 9.6 oz (79.7 kg), SpO2 100 %.    HEENT: Thrush or ulcers Lymphatics: No cervical or supraclavicular nodes Resp: Lungs clear bilaterally Cardio: Regular rate and rhythm GI: No hepatosplenomegaly, no mass, nontender Vascular: No leg edema   Lab Results:  Lab Results  Component Value Date   WBC 2.7 (L) 07/20/2018   HGB 11.6 (L) 07/20/2018   HCT 35.5 (L) 07/20/2018   MCV 90.3 07/20/2018   PLT 179 07/20/2018   NEUTROABS 1.5 (L) 07/20/2018    CMP  Lab Results  Component Value Date   NA 139 07/03/2018   K 3.9 07/03/2018   CL 102 07/03/2018   CO2 26 07/03/2018   GLUCOSE 106 (H) 07/03/2018   BUN 13 07/03/2018   CREATININE 0.86 07/03/2018   CALCIUM 9.8 07/03/2018   PROT 7.4 07/03/2018   ALBUMIN 4.1 07/03/2018   AST 16 07/03/2018   ALT 17 07/03/2018   ALKPHOS 77 07/03/2018   BILITOT 0.7 07/03/2018   GFRNONAA >60 07/03/2018   GFRAA >60 07/03/2018     Medications: I have reviewed the patient's current medications.   Assessment/Plan: 1. Gastroesophageal carcinoma ? 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 CTs 05/04/2018, distal esophagus mass extending to to below the GE junction, no evidence of metastatic disease, 4 mm nonspecific right upper lobe nodule ? Upper EUS  05/11/2018-large fungating ulcerating mass in the distal esophagus about 34 to 38 cm from the incisors, tumor extends to the GE junction; stagedT3 N1 MX ? Radiation 05/28/2018-07/04/2018 ? Cycle 1weeklycarboplatin/Taxol 05/29/2018 ? Cycle 2weeklycarboplatin/Taxol 06/06/2018 ? Cycle 3 weekly carboplatin/Taxol 06/12/2018 ? Cycle 4 weekly carboplatin/Taxol 06/20/2018 ? Cycle 5 weekly carboplatin/Taxol 06/26/2018 2. Migraine headaches 3. Multiple family members with breast cancer 4. Tobacco use   Disposition: Rhonda Newton has recovered from the course of chemotherapy/radiation.  She is scheduled for surgery at [facility] on 08/17/2018.  She will return for an office visit here during the week of 09/10/2018.  We are available to see her in the interim as needed.  15 minutes were spent with the patient today.  The majority of the time was used for counseling and coordination of care.  Betsy Coder, MD  07/20/2018  12:58 PM

## 2018-07-20 NOTE — Telephone Encounter (Signed)
Appts scheduled letter/calendar mailed per 11/8 los

## 2018-07-31 NOTE — Progress Notes (Signed)
  Radiation Oncology         ([phone]) [phone] ________________________________  Name: Rhonda Newton MRN: [id number]  Date: 07/04/2018  DOB: [birth year]  End of Treatment Note  Diagnosis:   52 y.o. female with Stage IIA, T2N0M0 Adenocarcinoma of the distal esophagus extending into the GE junction     Indication for treatment:  Curative       Radiation treatment dates:   05/28/2018 - 07/04/2018  Site/dose:   The esophagus received 50 Gy in 25 fractions initially, also using a simultaneous boost technique to 45 Gy. A boost was then given to 6 Gy/5.4 Gy using a SIB technique to yield 56 Gy to the high dose target and 50.4 Gy to the lower dose target.  Beams/energy:   IMRT / 6X Photon  Narrative: The patient tolerated radiation treatment relatively well with concurrent chemotherapy.  She experienced mild fatigue, significant nausea/vomiting, and poor appetite. She denied any swallowing issues.   Plan: The patient has completed radiation treatment. She will be seen in medical oncology for evaluation/possible fluids; have given prescriptions for Decadron and Ativan. The patient will return to radiation oncology clinic for routine followup in one month. I advised them to call or return sooner if they have any questions or concerns related to their recovery or treatment.  ------------------------------------------------  Jodelle Gross, MD, PhD  This document serves as a record of services personally performed by Kyung Rudd, MD. It was created on his behalf by Rae Lips, a trained medical scribe. The creation of this record is based on the scribe's personal observations and the provider's statements to them. This document has been checked and approved by the attending provider.

## 2018-08-13 ENCOUNTER — Encounter: Payer: Self-pay | Admitting: Radiation Oncology

## 2018-08-16 DIAGNOSIS — K22719 Barrett's esophagus with dysplasia, unspecified: Secondary | ICD-10-CM | POA: Insufficient documentation

## 2018-08-20 ENCOUNTER — Ambulatory Visit: Payer: Self-pay | Admitting: Radiation Oncology

## 2018-08-21 ENCOUNTER — Telehealth: Payer: Self-pay | Admitting: Oncology

## 2018-08-21 NOTE — Telephone Encounter (Signed)
Called per scheduling voicemail log, patient stated they already cancelled her appointments for her and that she do not know when she will be getting out the hospital to reschedule those appointments.

## 2018-09-10 ENCOUNTER — Inpatient Hospital Stay: Payer: BLUE CROSS/BLUE SHIELD | Attending: Oncology | Admitting: Oncology

## 2018-09-10 VITALS — BP 133/78 | HR 74 | Temp 98.3°F | Resp 16 | Ht 69.0 in | Wt 175.5 lb

## 2018-09-10 DIAGNOSIS — C160 Malignant neoplasm of cardia: Secondary | ICD-10-CM | POA: Diagnosis not present

## 2018-09-10 DIAGNOSIS — Z803 Family history of malignant neoplasm of breast: Secondary | ICD-10-CM | POA: Diagnosis not present

## 2018-09-10 DIAGNOSIS — G43909 Migraine, unspecified, not intractable, without status migrainosus: Secondary | ICD-10-CM | POA: Diagnosis not present

## 2018-09-10 DIAGNOSIS — Z9221 Personal history of antineoplastic chemotherapy: Secondary | ICD-10-CM

## 2018-09-10 DIAGNOSIS — Z9049 Acquired absence of other specified parts of digestive tract: Secondary | ICD-10-CM

## 2018-09-10 DIAGNOSIS — Z923 Personal history of irradiation: Secondary | ICD-10-CM

## 2018-09-10 NOTE — Progress Notes (Signed)
[facility] OFFICE PROGRESS NOTE   Diagnosis: Esophagus cancer  INTERVAL HISTORY:   Rhonda Newton returns as scheduled.  She underwent an esophagectomy and jejunostomy tube placement at [facility] on 08/17/2018. She was discharged from the hospital 08/26/2018.  She is tolerating a liquid diet.  She continues nighttime tube feedings.  She is scheduled to see Dr. D'Amico later this week.  She reports a nonproductive cough and increased dyspnea over the past week.  No fever.  The pathology from the esophagectomy revealed no residual adenocarcinoma consistent with a complete pathologic response.  No metastatic carcinoma involving lymph nodes.  She has discomfort at the right "breast ".  Objective:  Vital signs in last 24 hours:  Blood pressure 133/78, pulse 74, temperature 98.3 F (36.8 C), temperature source Oral, resp. rate 16, height 5' 9" (1.753 m), weight 175 lb 8 oz (79.6 kg), SpO2 100 %.    Resp: Decreased breath sounds at the right lower posterior chest, no respiratory distress Cardio: Regular rate and rhythm GI: No hepatomegaly, midline wound with staples in place, left upper quadrant feeding tube Vascular: No leg edema  Skin: Neck and right chest wall incisions appear healed    Lab Results:  Lab Results  Component Value Date   WBC 2.7 (L) 07/20/2018   HGB 11.6 (L) 07/20/2018   HCT 35.5 (L) 07/20/2018   MCV 90.3 07/20/2018   PLT 179 07/20/2018   NEUTROABS 1.5 (L) 07/20/2018    CMP  Lab Results  Component Value Date   NA 139 07/03/2018   K 3.9 07/03/2018   CL 102 07/03/2018   CO2 26 07/03/2018   GLUCOSE 106 (H) 07/03/2018   BUN 13 07/03/2018   CREATININE 0.86 07/03/2018   CALCIUM 9.8 07/03/2018   PROT 7.4 07/03/2018   ALBUMIN 4.1 07/03/2018   AST 16 07/03/2018   ALT 17 07/03/2018   ALKPHOS 77 07/03/2018   BILITOT 0.7 07/03/2018   GFRNONAA >60 07/03/2018   GFRAA >60 07/03/2018     Medications: I have reviewed the patient's current  medications.   Assessment/Plan:  1. Gastroesophageal carcinoma ? 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 CTs 05/04/2018, distal esophagus mass extending to to below the GE junction, no evidence of metastatic disease, 4 mm nonspecific right upper lobe nodule ? Upper EUS 05/11/2018-large fungating ulcerating mass in the distal esophagus about 34 to 38 cm from the incisors, tumor extends to the GE junction; stagedT3 N1 MX ? Radiation 05/28/2018-07/04/2018 ? Cycle 1weeklycarboplatin/Taxol 05/29/2018 ? Cycle 2weeklycarboplatin/Taxol 06/06/2018 ? Cycle 3 weekly carboplatin/Taxol 06/12/2018 ? Cycle 4 weekly carboplatin/Taxol 06/20/2018 ? Cycle 5 weekly carboplatin/Taxol 06/26/2018 ? PET scan 08/16/2018- decreased hypermetabolic activity and thickness of the distal esophagus consistent with treated residual neoplasm, no evidence of metastatic disease ? Esophagectomy and jejunostomy feeding tube 08/17/2018-complete pathologic response 2. Migraine headaches 3. Multiple family members with breast cancer 4. Tobacco use    Disposition: Rhonda Newton is recovering from the esophagectomy procedure.  The pathology is consistent with a complete pathologic response.  There is no indication for adjuvant therapy.  I discussed the pathology report with Rhonda Newton and her mother.  She will continue postoperative care with Dr. Damico.  She will return for an office visit here in 3 months.  I am available to see her sooner as needed.  Gary Sherrill, MD  09/10/2018  8:37 AM   

## 2018-09-11 ENCOUNTER — Telehealth: Payer: Self-pay | Admitting: Oncology

## 2018-09-11 ENCOUNTER — Emergency Department (HOSPITAL_COMMUNITY): Payer: BLUE CROSS/BLUE SHIELD

## 2018-09-11 ENCOUNTER — Encounter (HOSPITAL_COMMUNITY): Payer: Self-pay | Admitting: *Deleted

## 2018-09-11 ENCOUNTER — Emergency Department (HOSPITAL_COMMUNITY)
Admission: EM | Admit: 2018-09-11 | Discharge: 2018-09-11 | Disposition: A | Discharge status: Home / Self Care | Payer: BLUE CROSS/BLUE SHIELD | Attending: Emergency Medicine | Admitting: Emergency Medicine

## 2018-09-11 DIAGNOSIS — J90 Pleural effusion, not elsewhere classified: Secondary | ICD-10-CM

## 2018-09-11 DIAGNOSIS — F17210 Nicotine dependence, cigarettes, uncomplicated: Secondary | ICD-10-CM | POA: Diagnosis not present

## 2018-09-11 DIAGNOSIS — R0602 Shortness of breath: Secondary | ICD-10-CM | POA: Insufficient documentation

## 2018-09-11 LAB — COMPREHENSIVE METABOLIC PANEL
ALT: 20 U/L (ref 0–44)
AST: 19 U/L (ref 15–41)
Albumin: 3.9 g/dL (ref 3.5–5.0)
Alkaline Phosphatase: 87 U/L (ref 38–126)
Anion gap: 12 (ref 5–15)
BUN: 15 mg/dL (ref 6–20)
CO2: 25 mmol/L (ref 22–32)
Calcium: 9.6 mg/dL (ref 8.9–10.3)
Chloride: 101 mmol/L (ref 98–111)
Creatinine, Ser: 0.7 mg/dL (ref 0.44–1.00)
GFR calc Af Amer: >60 mL/min (ref >60)
GFR calc non Af Amer: >60 mL/min (ref >60)
GLUCOSE: 92 mg/dL (ref 70–99)
Potassium: 3.9 mmol/L (ref 3.5–5.1)
SODIUM: 138 mmol/L (ref 135–145)
Total Bilirubin: 0.4 mg/dL (ref 0.3–1.2)
Total Protein: 7.9 g/dL (ref 6.5–8.1)

## 2018-09-11 LAB — CBC WITH DIFFERENTIAL/PLATELET
Abs Immature Granulocytes: 0.03 10*3/uL (ref 0.00–0.07)
Basophils Absolute: 0.1 10*3/uL (ref 0.0–0.1)
Basophils Relative: 1 %
Eosinophils Absolute: 0.3 10*3/uL (ref 0.0–0.5)
Eosinophils Relative: 5 %
HCT: 39.8 % (ref 36.0–46.0)
Hemoglobin: 12.4 g/dL (ref 12.0–15.0)
Immature Granulocytes: 1 %
LYMPHS PCT: 11 %
Lymphs Abs: 0.7 10*3/uL (ref 0.7–4.0)
MCH: 29.5 pg (ref 26.0–34.0)
MCHC: 31.2 g/dL (ref 30.0–36.0)
MCV: 94.8 fL (ref 80.0–100.0)
Monocytes Absolute: 0.8 10*3/uL (ref 0.1–1.0)
Monocytes Relative: 13 %
NEUTROS ABS: 4.1 10*3/uL (ref 1.7–7.7)
Neutrophils Relative %: 69 %
Platelets: 339 10*3/uL (ref 150–400)
RBC: 4.2 MIL/uL (ref 3.87–5.11)
RDW: 13.1 % (ref 11.5–15.5)
WBC: 6 10*3/uL (ref 4.0–10.5)
nRBC: 0 % (ref 0.0–0.2)

## 2018-09-11 LAB — I-STAT TROPONIN, ED
TROPONIN I, POC: 0.01 ng/mL (ref 0.00–0.08)
Troponin i, poc: 0 ng/mL (ref 0.00–0.08)

## 2018-09-11 LAB — I-STAT CG4 LACTIC ACID, ED
Lactic Acid, Venous: 1.02 mmol/L (ref 0.5–1.9)
Lactic Acid, Venous: 1.74 mmol/L (ref 0.5–1.9)

## 2018-09-11 LAB — D-DIMER, QUANTITATIVE: D-Dimer, Quant: 1.62 ug/mL-FEU — ABNORMAL HIGH (ref 0.00–0.50)

## 2018-09-11 LAB — LIPASE, BLOOD: Lipase: 24 U/L (ref 11–51)

## 2018-09-11 MED ORDER — IOPAMIDOL (ISOVUE-370) INJECTION 76%
INTRAVENOUS | Status: AC
  Administered 2018-09-11: 100 mL
  Filled 2018-09-11: qty 100

## 2018-09-11 NOTE — ED Notes (Signed)
Patient verbalizes understanding of discharge instructions. Opportunity for questioning and answers were provided. Armband removed by staff, pt discharged from ED ambulatory to home.  

## 2018-09-11 NOTE — ED Triage Notes (Signed)
Pt in c/o cough and shortness of breath that started on Friday, increased over the weekend, pt had surgery on Dec 6th on her esophagus at [facility], pt called her oncologist who recommended she come in for further evaluation

## 2018-09-11 NOTE — Telephone Encounter (Signed)
Scheduled appt per 12/30 los - f/u in 3 months - sent reminder letter in the mail with appt date and time

## 2018-09-11 NOTE — ED Provider Notes (Signed)
Signed out from Dr. Sherry Ruffing pending CT angio chest.  CT without evidence of pulmonary embolism.  Patient's oxygen saturation 100% while ambulating.  Will discharge home to follow-up with primary physician.  Return precautions given.   Rhonda Rice, MD 09/11/18 [phone]

## 2018-09-11 NOTE — ED Provider Notes (Signed)
[facility] EMERGENCY DEPARTMENT Provider Note   CSN: [id number] Arrival date & time: 09/11/18  1041     History   Chief Complaint Chief Complaint  Patient presents with  . Shortness of Breath  . Cough    HPI Rhonda Newton is a 52 y.o. female.  The history is provided by the patient, medical records and a relative. No language interpreter was used.  Shortness of Breath  This is a new problem. The average episode lasts 3 days. The problem occurs continuously.The current episode started more than 2 days ago. The problem has not changed since onset.Associated symptoms include chest pain. Pertinent negatives include no fever, no rhinorrhea, no neck pain, no cough, no sputum production, no hemoptysis, no syncope, no vomiting, no abdominal pain, no rash, no leg pain and no leg swelling. Orthopnea: pressure. Risk factors: recent surgery. She has tried nothing for the symptoms. She has had prior hospitalizations. Associated medical issues include recent surgery.    Past Medical History:  Diagnosis Date  . Anxiety   . Anxiety and depression   . Barrett's esophagus 05/18/2018  . Depression   . Family history of breast cancer   . Family history of colon cancer   . Family history of melanoma   . Family history of prostate cancer   . 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 Genetic testing 07/06/2018  . Family history of breast cancer   . Family history of colon cancer   . Family history of prostate cancer   . Family history of melanoma   . Malignant neoplasm of lower third of esophagus ([place]) 05/16/2018  . Benzodiazepine causing adverse effect in therapeutic use, sequela 02/13/2012  . Bipolar affective disorder, mixed, moderate degree ([place]) 02/10/2012    Class: Acute  . Panic disorder with agoraphobia, moderate agoraphobic avoidance and moderate panic attacks 02/10/2012    Past Surgical History:    Procedure Laterality Date  . cataracts Bilateral    age 71  . INTRAUTERINE DEVICE INSERTION     Paraguard     OB History   No obstetric history on file.      Home Medications    Prior to Admission medications   Medication Sig Start Date End Date Taking? Authorizing Provider  Docusate Sodium (COLACE PO) Take 1 tablet by mouth 2 (two) times daily.    [provider]  ibuprofen (ADVIL,MOTRIN) 200 MG tablet Take 200 mg by mouth every 6 (six) hours as needed.    [provider]  SUMAtriptan (IMITREX) 25 MG tablet Take 25 mg by mouth every 2 (two) hours as needed for migraine. May repeat in 2 hours if headache persists or recurs.    [provider]    Family History Family History  Problem Relation Age of Onset  . Barrett's esophagus Mother   . Hypertension Mother   . Breast cancer Mother 69  . Breast cancer Sister 32  . Breast cancer Maternal Aunt 73  . Barrett's esophagus Maternal Aunt   . Breast cancer Paternal Aunt        dx in her 25s  . Heart disease Father   . Prostate cancer Maternal Uncle        dx in his mid 52s  . COPD Paternal Uncle   . Heart disease Maternal Grandmother   . Heart disease Maternal Grandfather   . Congestive Heart Failure Paternal Grandmother   .  Dementia Paternal Grandmother   . Kidney cancer Paternal Grandfather   . Melanoma Cousin        mat first cousin; dx 77s-40s  . Prostate cancer Cousin        mat first cousin dx in his mid 5s  . Breast cancer Other        PGMs sister  . Colon cancer Other        PGMs sister  . Pancreatic cancer Other        MGFs sister    Social History Social History   Tobacco Use  . Smoking status: Current Every Day Smoker    Packs/day: 1.00    Types: Cigarettes  . Smokeless tobacco: Never Used  Substance Use Topics  . Alcohol use: No    Comment: occasional  . Drug use: Not Currently    Types: Marijuana     Allergies   Benzodiazepines   Review of Systems Review of  Systems  Constitutional: Negative for chills, diaphoresis, fatigue and fever.  HENT: Negative for congestion and rhinorrhea.   Eyes: Negative for visual disturbance.  Respiratory: Positive for chest tightness and shortness of breath. Negative for cough, hemoptysis, sputum production, choking and stridor.   Cardiovascular: Positive for chest pain. Negative for palpitations, leg swelling and syncope. Orthopnea: pressure.  Gastrointestinal: Negative for abdominal pain, diarrhea, nausea and vomiting.  Genitourinary: Negative for dysuria, flank pain and frequency.  Musculoskeletal: Negative for back pain, neck pain and neck stiffness.  Skin: Negative for rash and wound.  Neurological: Negative for dizziness and light-headedness.  Psychiatric/Behavioral: Negative for agitation.  All other systems reviewed and are negative.    Physical Exam Updated Vital Signs BP 120/82 (BP Location: Right Arm)   Pulse 77   Temp 98.2 F (36.8 C) (Oral)   Resp 17   LMP 02/02/2012   SpO2 99%   Physical Exam Vitals signs and nursing note reviewed.  Constitutional:      General: She is not in acute distress.    Appearance: She is well-developed. She is not ill-appearing, toxic-appearing or diaphoretic.  HENT:     Head: Normocephalic and atraumatic.  Eyes:     Conjunctiva/sclera: Conjunctivae normal.  Neck:     Musculoskeletal: Normal range of motion and neck supple.  Cardiovascular:     Rate and Rhythm: Normal rate and regular rhythm.     Heart sounds: No murmur.  Pulmonary:     Effort: Pulmonary effort is normal. Tachypnea present. No respiratory distress.     Breath sounds: Rales present. No decreased breath sounds, wheezing or rhonchi.  Abdominal:     General: Abdomen is flat. Bowel sounds are normal.     Palpations: Abdomen is soft.     Tenderness: There is no abdominal tenderness. There is no right CVA tenderness, left CVA tenderness, guarding or rebound.    Musculoskeletal:     Right  lower leg: She exhibits no tenderness. No edema.     Left lower leg: She exhibits no tenderness. No edema.  Skin:    General: Skin is warm and dry.     Capillary Refill: Capillary refill takes less than 2 seconds.  Neurological:     General: No focal deficit present.     Mental Status: She is alert.      ED Treatments / Results  Labs (all labs ordered are listed, but only abnormal results are displayed) Labs Reviewed  D-DIMER, QUANTITATIVE (NOT AT [facility]) - Abnormal; Notable for the following components:  Result Value   D-Dimer, Quant 1.62 (*)    All other components within normal limits  CBC WITH DIFFERENTIAL/PLATELET  COMPREHENSIVE METABOLIC PANEL  LIPASE, BLOOD  I-STAT TROPONIN, ED  I-STAT CG4 LACTIC ACID, ED  I-STAT CG4 LACTIC ACID, ED  I-STAT TROPONIN, ED    EKG EKG Interpretation  Date/Time:  Tuesday September 11 2018 12:25:46 EST Ventricular Rate:  75 PR Interval:    QRS Duration: 94 QT Interval:  373 QTC Calculation: 417 R Axis:   42 Text Interpretation:  Sinus rhythm No prior ecg for comparison.  No STEMI Confirmed by Antony Blackbird [phone]) on 09/11/2018 12:30:52 PM Also confirmed by Antony Blackbird [phone]), editor Philomena Doheny [phone])  on 09/11/2018 2:42:16 PM   Radiology Dg Chest 2 View  Result Date: 09/11/2018 CLINICAL DATA:  Cough, shortness of breath, history of esophagectomy in early December EXAM: CHEST - 2 VIEW COMPARISON:  Chest x-ray of 12/02/2009 and PET CT chest of 05/18/2017 FINDINGS: Post esophagectomy there are small pleural effusions present right slightly larger than left with mild bibasilar atelectasis. No pneumonia is seen. Mediastinal and hilar contours are unremarkable. Heart size is stable. No bony abnormality is seen. IMPRESSION: Small pleural effusions are now present bilaterally right slightly greater than left with mild bibasilar atelectasis. Electronically Signed   By: Ivar Drape M.D.   On: 09/11/2018 11:32     Procedures Procedures (including critical care time)  Medications Ordered in ED Medications - No data to display   Initial Impression / Assessment and Plan / ED Course  I have reviewed the triage vital signs and the nursing notes.  Pertinent labs & imaging results that were available during my care of the patient were reviewed by me and considered in my medical decision making (see chart for details).     Rhonda Newton is a 52 y.o. female with a past medical history significant for esophagitis neoplasm status post radiation/chemotherapy/resection which took place at [facility] on 08/17/2018 who presents for 3 days of shortness of breath and chest tightness.  Patient reports that she has never had this before.  She reports that over the last several days she has had worsened shortness of breath that is worsened when she tries to speak for long time or exert herself.  She called her PCP and team at [facility] who initially thought this was likely related to pleural effusions which are a normal postoperative complication however with the chest tightness they recommended she come to the emergency department for evaluation.  Patient denies significant cough or congestion.  She denies fevers or chills.  She denies any nausea, vomiting, constipation, diarrhea, dysuria.  She reports the surgical sites on her abdomen are nontender and are well appearing.  She scheduled to see her [facility] team in the neck several days however they told her to come today.  On exam, Chest is nontender.  Abdomen is nontender and no drainage from all of the surgical areas.  Patient has some faint crackles at the bases of her lungs.  No rhonchi appreciated.  No lower extremity tenderness or swelling.  Patient resting comfortably on room air.  Patient had work-up including labs and x-ray.  X-ray shows pleural effusions and atelectasis but no evidence of pneumonia.  Patient was tachypneic on reassessment and was breathing quickly during  conversation.  Patient is not hypoxic however.  Shared decision made conversation held with patient as to further work-up.  Patient agreed to have a d-dimer to rule out DVT given her history  of cancer and recent surgery with her new shortness of breath and chest tightness.  D-dimer was positive and patient will have CT PE study.  If PE study is negative, dysphagia stable for discharge home with PCP and [facility] team follow-up if she is able to ambulate without hypoxia on room air.  If abnormalities discovered, will treat accordingly.  Care transferred to Dr. Lita Mains while awaiting for CT results to return.  Anticipate discharge.   Final Clinical Impressions(s) / ED Diagnoses   Final diagnoses:  Shortness of breath     Clinical Impression: 1. Shortness of breath     Disposition: Care transferred to Dr. Lita Mains while awaiting for CT results.  Anticipate discharge home if work-up is reassuring.  This note was prepared with assistance of Systems analyst. Occasional wrong-word or sound-a-like substitutions may have occurred due to the inherent limitations of voice recognition software.      Tegeler, Gwenyth Allegra, MD 09/11/18 [phone]

## 2018-09-11 NOTE — ED Notes (Signed)
Pt ambulatory with pulse ox on. Pt's O2 sats remained at 100% room air while ambulating

## 2018-12-10 ENCOUNTER — Telehealth: Payer: Self-pay | Admitting: Oncology

## 2018-12-10 NOTE — Telephone Encounter (Signed)
Called pt per sch message to r/s appt - unable to reach patient -left vmail for patient to call back to r/s

## 2018-12-10 NOTE — Telephone Encounter (Signed)
Called patient and confirmed she is doing well with no problems. She prefers to not come this week. Will move appointment out for late April or early May. Scheduling message sent.

## 2018-12-11 ENCOUNTER — Inpatient Hospital Stay: Payer: BLUE CROSS/BLUE SHIELD | Admitting: Oncology

## 2018-12-11 ENCOUNTER — Telehealth: Payer: Self-pay | Admitting: Oncology

## 2018-12-11 NOTE — Telephone Encounter (Signed)
Scheduled apt per 3/30 sch message - unable to reach patient - mail letter and left vmail.

## 2019-01-08 ENCOUNTER — Telehealth: Payer: Self-pay | Admitting: *Deleted

## 2019-01-08 NOTE — Telephone Encounter (Signed)
Left message requesting update on diagnosis, care plan and appointments. Also sending fax with request for chart information to be sent in.. Called back and left VM that last visit here was 08/2018 and she will be seen again in May. Had esophagectomy that was successful and will receive all post op care per Dr. Otilio Connors at [facility]. No plans for chemotherapy unless she has a recurrence. If more info is required call back tomorrow.

## 2019-01-15 ENCOUNTER — Inpatient Hospital Stay: Payer: BLUE CROSS/BLUE SHIELD | Attending: Oncology | Admitting: Oncology

## 2019-01-15 ENCOUNTER — Telehealth: Payer: Self-pay | Admitting: Oncology

## 2019-01-15 ENCOUNTER — Other Ambulatory Visit: Payer: Self-pay

## 2019-01-15 VITALS — BP 122/74 | HR 80 | Temp 96.7°F | Resp 18 | Ht 69.0 in | Wt 169.3 lb

## 2019-01-15 DIAGNOSIS — Z923 Personal history of irradiation: Secondary | ICD-10-CM | POA: Diagnosis not present

## 2019-01-15 DIAGNOSIS — C160 Malignant neoplasm of cardia: Secondary | ICD-10-CM | POA: Insufficient documentation

## 2019-01-15 DIAGNOSIS — G43909 Migraine, unspecified, not intractable, without status migrainosus: Secondary | ICD-10-CM

## 2019-01-15 DIAGNOSIS — Z803 Family history of malignant neoplasm of breast: Secondary | ICD-10-CM | POA: Diagnosis not present

## 2019-01-15 DIAGNOSIS — Z9221 Personal history of antineoplastic chemotherapy: Secondary | ICD-10-CM | POA: Insufficient documentation

## 2019-01-15 DIAGNOSIS — Z9049 Acquired absence of other specified parts of digestive tract: Secondary | ICD-10-CM

## 2019-01-15 NOTE — Progress Notes (Signed)
  [place] OFFICE PROGRESS NOTE   Diagnosis: Gastroesophageal cancer  INTERVAL HISTORY:   [name] returns as scheduled.  She feels well.  Good appetite.  She eats frequent meals.  She has gained weight recently.  No dyspnea.  She is scheduled to see Dr. Otilio Connors in June.  She is working from home.  Objective:  Vital signs in last 24 hours:  Blood pressure 122/74, pulse 80, temperature (!) 96.7 F (35.9 C), temperature source Oral, resp. rate 18, height _0  (1.753 m), weight 169 lb 4.8 oz (76.8 kg), last menstrual period 02/02/2012, SpO2 100 %.    HEENT: Neck without mass Lymphatics: No cervical or supraclavicular nodes Resp: Lungs clear bilaterally Cardio: Regular rate and rhythm GI: No hepatomegaly, no mass, nontender Vascular: No leg edema   Lab Results:  Lab Results  Component Value Date   WBC 6.0 09/11/2018   HGB 12.4 09/11/2018   HCT 39.8 09/11/2018   MCV 94.8 09/11/2018   PLT 339 09/11/2018   NEUTROABS 4.1 09/11/2018    CMP  Lab Results  Component Value Date   NA 138 09/11/2018   K 3.9 09/11/2018   CL 101 09/11/2018   CO2 25 09/11/2018   GLUCOSE 92 09/11/2018   BUN 15 09/11/2018   CREATININE 0.70 09/11/2018   CALCIUM 9.6 09/11/2018   PROT 7.9 09/11/2018   ALBUMIN 3.9 09/11/2018   AST 19 09/11/2018   ALT 20 09/11/2018   ALKPHOS 87 09/11/2018   BILITOT 0.4 09/11/2018   GFRNONAA >60 09/11/2018   GFRAA >60 09/11/2018    Medications: I have reviewed the patient's current medications.   Assessment/Plan: 1. Gastroesophageal carcinoma ? 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 CTs 05/04/2018, distal esophagus mass extending to to below the GE junction, no evidence of metastatic disease, 4 mm nonspecific right upper lobe nodule ? Upper EUS 05/11/2018-large fungating ulcerating  mass in the distal esophagus about 34 to 38 cm from the incisors, tumor extends to the GE junction; stagedT3 N1 MX ? Radiation 05/28/2018-07/04/2018 ? Cycle 1weeklycarboplatin/Taxol 05/29/2018 ? Cycle 2weeklycarboplatin/Taxol 06/06/2018 ? Cycle 3 weekly carboplatin/Taxol 06/12/2018 ? Cycle 4 weekly carboplatin/Taxol 06/20/2018 ? Cycle 5 weekly carboplatin/Taxol 06/26/2018 ? PET scan 08/16/2018- decreased hypermetabolic activity and thickness of the distal esophagus consistent with treated residual neoplasm, no evidence of metastatic disease ? Esophagectomy and jejunostomy feeding tube 08/17/2018-complete pathologic response 2. Migraine headaches 3. Multiple family members with breast cancer 4. Tobacco use   Disposition: Rhonda Newton appears well.  She is in remission from gastroesophageal cancer.  She is scheduled for a restaging CT at [facility] next month.  She will return for an office visit in 4 months.  Betsy Coder, MD  01/15/2019  4:00 PM

## 2019-01-15 NOTE — Telephone Encounter (Signed)
Scheduled appt per 5/5 los. ° °A calendar will be mailed out. °

## 2019-02-25 ENCOUNTER — Telehealth: Payer: Self-pay | Admitting: *Deleted

## 2019-02-25 NOTE — Telephone Encounter (Signed)
Called to report since Friday night, she has had explosive diarrhea. Has already gone X 4 today and wakes up at night for bowel movement. No fever or N/V. Last chemo 06/2018. She has purchased Imodium and non diary yogurt. Asking who she should see? Does not have a PCP. Suggested she call an urgent care to alert them to her symptoms before arrival. Not likely to be related to her cancer diagnosis and could have infection of bowel or potentially COVID. She agrees to call [facility] to be seen today.

## 2019-05-17 ENCOUNTER — Other Ambulatory Visit: Payer: Self-pay

## 2019-05-17 ENCOUNTER — Encounter: Payer: Self-pay | Admitting: Nurse Practitioner

## 2019-05-17 ENCOUNTER — Inpatient Hospital Stay: Payer: BC Managed Care – PPO | Attending: Nurse Practitioner | Admitting: Nurse Practitioner

## 2019-05-17 VITALS — BP 123/71 | HR 75 | Temp 98.7°F | Resp 18 | Ht 69.0 in | Wt 169.7 lb

## 2019-05-17 DIAGNOSIS — G43909 Migraine, unspecified, not intractable, without status migrainosus: Secondary | ICD-10-CM | POA: Insufficient documentation

## 2019-05-17 DIAGNOSIS — Z923 Personal history of irradiation: Secondary | ICD-10-CM | POA: Diagnosis not present

## 2019-05-17 DIAGNOSIS — C160 Malignant neoplasm of cardia: Secondary | ICD-10-CM

## 2019-05-17 DIAGNOSIS — Z9221 Personal history of antineoplastic chemotherapy: Secondary | ICD-10-CM | POA: Insufficient documentation

## 2019-05-17 DIAGNOSIS — Z8501 Personal history of malignant neoplasm of esophagus: Secondary | ICD-10-CM | POA: Diagnosis present

## 2019-05-17 NOTE — Progress Notes (Signed)
  [facility] OFFICE PROGRESS NOTE   Diagnosis: Gastroesophageal cancer  INTERVAL HISTORY:   Rhonda Newton returns as scheduled.  She feels well.  She has a good appetite and good energy level.  No pain except for periodic migraine headaches.  She takes sumatriptan as needed with good relief.  She denies dysphagia.  No cough or shortness of breath.  No GI or GU complaints.  Objective:  Vital signs in last 24 hours:  Blood pressure 123/71, pulse 75, temperature 98.7 F (37.1 C), temperature source Oral, resp. rate 18, height '5\' 9"'$  (1.753 m), weight 169 lb 11.2 oz (77 kg), last menstrual period 02/02/2012, SpO2 100 %.    HEENT: Neck without mass. Lymphatics: No palpable cervical, supraclavicular, axillary or inguinal lymph nodes. Resp: Lungs clear bilaterally. Cardio: Regular rate and rhythm. GI: Abdomen soft and nontender.  No hepatomegaly. Vascular: No leg edema.    Lab Results:  Lab Results  Component Value Date   WBC 6.0 09/11/2018   HGB 12.4 09/11/2018   HCT 39.8 09/11/2018   MCV 94.8 09/11/2018   PLT 339 09/11/2018   NEUTROABS 4.1 09/11/2018    Imaging:  No results found.  Medications: I have reviewed the patient's current medications.  Assessment/Plan: 1. Gastroesophageal carcinoma ? 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 CTs 05/04/2018, distal esophagus mass extending to to below the GE junction, no evidence of metastatic disease, 4 mm nonspecific right upper lobe nodule ? Upper EUS 05/11/2018-large fungating ulcerating mass in the distal esophagus about 34 to 38 cm from the incisors, tumor extends to the GE junction; stagedT3 N1 MX ? Radiation 05/28/2018-07/04/2018 ? Cycle 1weeklycarboplatin/Taxol 05/29/2018 ? Cycle 2weeklycarboplatin/Taxol 06/06/2018 ? Cycle 3 weekly carboplatin/Taxol  06/12/2018 ? Cycle 4 weekly carboplatin/Taxol 06/20/2018 ? Cycle 5 weekly carboplatin/Taxol 06/26/2018 ? PET scan 08/16/2018- decreased hypermetabolic activity and thickness of the distal esophagus consistent with treated residual neoplasm, no evidence of metastatic disease ? Esophagectomy and jejunostomy feeding tube 08/17/2018-complete pathologic response ? CTs 03/07/2019- new right middle lobe atelectasis or scarring; stable tiny right upper lobe pulmonary nodule; status post esophagogastrectomy with expected postoperative appearance; no evidence of metastatic disease in the abdomen or pelvis.  Similar right adrenal thickening. 2. Migraine headaches 3. Multiple family members with breast cancer 4. Tobacco use  Disposition: Rhonda Newton remains in clinical remission from gastroesophageal cancer.  She continues surveillance CTs at [facility], next scheduled December 2020.  She will return for a follow-up visit here in 4 months.    Ned Card ANP/GNP-BC   05/17/2019  1:56 PM

## 2019-06-05 ENCOUNTER — Telehealth: Payer: Self-pay | Admitting: *Deleted

## 2019-06-05 NOTE — Telephone Encounter (Signed)
Records faxed to [facility] - Release [id number]

## 2019-09-16 ENCOUNTER — Inpatient Hospital Stay: Payer: BC Managed Care – PPO | Attending: Oncology | Admitting: Oncology

## 2019-09-16 ENCOUNTER — Other Ambulatory Visit: Payer: Self-pay

## 2019-09-16 VITALS — BP 113/82 | HR 77 | Temp 98.0°F | Resp 16 | Ht 69.0 in | Wt 173.2 lb

## 2019-09-16 DIAGNOSIS — C159 Malignant neoplasm of esophagus, unspecified: Secondary | ICD-10-CM | POA: Diagnosis not present

## 2019-09-16 DIAGNOSIS — Z803 Family history of malignant neoplasm of breast: Secondary | ICD-10-CM | POA: Diagnosis not present

## 2019-09-16 DIAGNOSIS — C160 Malignant neoplasm of cardia: Secondary | ICD-10-CM

## 2019-09-16 DIAGNOSIS — G43909 Migraine, unspecified, not intractable, without status migrainosus: Secondary | ICD-10-CM | POA: Insufficient documentation

## 2019-09-16 NOTE — Progress Notes (Signed)
  [place] OFFICE PROGRESS NOTE   Diagnosis: Esophagus cancer  INTERVAL HISTORY:   [name] returns as scheduled.  She feels well.  Good appetite.  No dysphagia.  She is working.  No complaint.  She is scheduled for restaging CTs at [facility] tomorrow.  Objective:  Vital signs in last 24 hours:  Blood pressure 113/82, pulse 77, temperature 98 F (36.7 C), temperature source Temporal, resp. rate 16, height _0  (1.753 m), weight 173 lb 3.2 oz (78.6 kg), last menstrual period 02/02/2012, SpO2 99 %.    Limited physical examination secondary to distancing with the Covid pandemic Lymphatics: No cervical, supraclavicular, axillary, or inguinal nodes GI: No hepatomegaly, no mass, nontender Vascular: No leg edema  Skin: Abdominal, neck, and chest wall incisions without evidence of recurrent tumor    Lab Results:  Lab Results  Component Value Date   WBC 6.0 09/11/2018   HGB 12.4 09/11/2018   HCT 39.8 09/11/2018   MCV 94.8 09/11/2018   PLT 339 09/11/2018   NEUTROABS 4.1 09/11/2018    CMP  Lab Results  Component Value Date   NA 138 09/11/2018   K 3.9 09/11/2018   CL 101 09/11/2018   CO2 25 09/11/2018   GLUCOSE 92 09/11/2018   BUN 15 09/11/2018   CREATININE 0.70 09/11/2018   CALCIUM 9.6 09/11/2018   PROT 7.9 09/11/2018   ALBUMIN 3.9 09/11/2018   AST 19 09/11/2018   ALT 20 09/11/2018   ALKPHOS 87 09/11/2018   BILITOT 0.4 09/11/2018   GFRNONAA >60 09/11/2018   GFRAA >60 09/11/2018     Medications: I have reviewed the patient's current medications.   Assessment/Plan: 1. Gastroesophageal carcinoma ? 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 CTs 05/04/2018, distal esophagus mass extending to to below the GE junction, no evidence of metastatic disease, 4 mm nonspecific right upper lobe  nodule ? Upper EUS 05/11/2018-large fungating ulcerating mass in the distal esophagus about 34 to 38 cm from the incisors, tumor extends to the GE junction; stagedT3 N1 MX ? Radiation 05/28/2018-07/04/2018 ? Cycle 1weeklycarboplatin/Taxol 05/29/2018 ? Cycle 2weeklycarboplatin/Taxol 06/06/2018 ? Cycle 3 weekly carboplatin/Taxol 06/12/2018 ? Cycle 4 weekly carboplatin/Taxol 06/20/2018 ? Cycle 5 weekly carboplatin/Taxol 06/26/2018 ? PET scan 08/16/2018- decreased hypermetabolic activity and thickness of the distal esophagus consistent with treated residual neoplasm, no evidence of metastatic disease ? Esophagectomy and jejunostomy feeding tube 08/17/2018-complete pathologic response ? CTs 03/07/2019- new right middle lobe atelectasis or scarring; stable tiny right upper lobe pulmonary nodule; status post esophagogastrectomy with expected postoperative appearance; no evidence of metastatic disease in the abdomen or pelvis.  Similar right adrenal thickening. 2. Migraine headaches 3. Multiple family members with breast cancer 4. History of tobacco use    Disposition: Mr. Rhonda Newton is in clinical remission from gastroesophageal cancer.  She will undergo restaging CTs at [facility] tomorrow.  She will return for an office visit in 6 months.  Betsy Coder, MD  09/16/2019  12:07 PM

## 2019-09-17 DIAGNOSIS — R9389 Abnormal findings on diagnostic imaging of other specified body structures: Secondary | ICD-10-CM | POA: Diagnosis not present

## 2019-09-17 DIAGNOSIS — C155 Malignant neoplasm of lower third of esophagus: Secondary | ICD-10-CM | POA: Diagnosis not present

## 2019-09-18 ENCOUNTER — Telehealth: Payer: Self-pay | Admitting: Oncology

## 2019-09-18 NOTE — Telephone Encounter (Signed)
Scheduled per los. Called and left msg. Mailed printout  °

## 2019-09-19 DIAGNOSIS — C155 Malignant neoplasm of lower third of esophagus: Secondary | ICD-10-CM | POA: Diagnosis not present

## 2019-12-04 ENCOUNTER — Ambulatory Visit: Payer: BC Managed Care – PPO | Attending: Internal Medicine

## 2019-12-04 DIAGNOSIS — Z20822 Contact with and (suspected) exposure to covid-19: Secondary | ICD-10-CM | POA: Diagnosis not present

## 2019-12-05 LAB — SARS-COV-2, NAA 2 DAY TAT

## 2019-12-05 LAB — NOVEL CORONAVIRUS, NAA: SARS-CoV-2, NAA: NOT DETECTED

## 2020-03-17 ENCOUNTER — Ambulatory Visit: Payer: BC Managed Care – PPO | Admitting: Oncology

## 2020-03-19 DIAGNOSIS — C159 Malignant neoplasm of esophagus, unspecified: Secondary | ICD-10-CM | POA: Diagnosis not present

## 2020-03-19 DIAGNOSIS — C155 Malignant neoplasm of lower third of esophagus: Secondary | ICD-10-CM | POA: Diagnosis not present

## 2020-03-24 ENCOUNTER — Other Ambulatory Visit: Payer: Self-pay

## 2020-03-24 ENCOUNTER — Inpatient Hospital Stay: Payer: BC Managed Care – PPO | Attending: Oncology | Admitting: Oncology

## 2020-03-24 VITALS — BP 120/75 | HR 79 | Temp 97.5°F | Resp 18 | Ht 69.0 in | Wt 180.7 lb

## 2020-03-24 DIAGNOSIS — Z803 Family history of malignant neoplasm of breast: Secondary | ICD-10-CM | POA: Insufficient documentation

## 2020-03-24 DIAGNOSIS — Z9221 Personal history of antineoplastic chemotherapy: Secondary | ICD-10-CM | POA: Diagnosis not present

## 2020-03-24 DIAGNOSIS — Z8501 Personal history of malignant neoplasm of esophagus: Secondary | ICD-10-CM | POA: Diagnosis not present

## 2020-03-24 DIAGNOSIS — C160 Malignant neoplasm of cardia: Secondary | ICD-10-CM

## 2020-03-24 DIAGNOSIS — R6881 Early satiety: Secondary | ICD-10-CM | POA: Diagnosis not present

## 2020-03-24 DIAGNOSIS — Z87891 Personal history of nicotine dependence: Secondary | ICD-10-CM | POA: Insufficient documentation

## 2020-03-24 NOTE — Progress Notes (Signed)
  [facility] OFFICE PROGRESS NOTE   Diagnosis: Gastroesophageal cancer  INTERVAL HISTORY:   Rhonda Newton returns as scheduled.  She feels well.  No new complaint.  She has mild early satiety, chiefly with carbohydrate meals.  She saw Dr. Elenor Newton on 03/19/2020.  There was no evidence of recurrence on surveillance CTs.  Objective:  Vital signs in last 24 hours:  Blood pressure 120/75, pulse 79, temperature (!) 97.5 F (36.4 C), temperature source Temporal, resp. rate 18, height _0  (1.753 m), weight 180 lb 11.2 oz (82 kg), last menstrual period 02/02/2012, SpO2 99 %.    HEENT: Neck without mass Lymphatics: No cervical, supraclavicular, axillary, or inguinal nodes Resp: Lungs clear bilaterally Cardio: Regular rate and rhythm GI: No hepatosplenomegaly, nontender, no mass Vascular: No leg edema  Skin: Upper abdomen, right neck, and right chest wall scars without evidence of recurrent tumor   Medications: I have reviewed the patient's current medications.   Assessment/Plan: 1. Gastroesophageal carcinoma ? 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 CTs 05/04/2018, distal esophagus mass extending to to below the GE junction, no evidence of metastatic disease, 4 mm nonspecific right upper lobe nodule ? Upper EUS 05/11/2018-large fungating ulcerating mass in the distal esophagus about 34 to 38 cm from the incisors, tumor extends to the GE junction; stagedT3 N1 MX ? Radiation 05/28/2018-07/04/2018 ? Cycle 1weeklycarboplatin/Taxol 05/29/2018 ? Cycle 2weeklycarboplatin/Taxol 06/06/2018 ? Cycle 3 weekly carboplatin/Taxol 06/12/2018 ? Cycle 4 weekly carboplatin/Taxol 06/20/2018 ? Cycle 5 weekly carboplatin/Taxol 06/26/2018 ? PET scan 08/16/2018- decreased hypermetabolic activity and thickness of the distal esophagus  consistent with treated residual neoplasm, no evidence of metastatic disease ? Esophagectomy and jejunostomy feeding tube 08/17/2018-complete pathologic response ? CTs 03/07/2019- new right middle lobe atelectasis or scarring; stable tiny right upper lobe pulmonary nodule; status post esophagogastrectomy with expected postoperative appearance; no evidence of metastatic disease in the abdomen or pelvis.  Similar right adrenal thickening. ? CT 04/08/2020-no evidence of recurrent disease 2. Migraine headaches 3. Multiple family members with breast cancer 4. History of tobacco use  Disposition: Rhonda Newton is in remission from gastroesophageal cancer.  She is scheduled for a follow-up appoint with Dr. Elenor Newton and surveillance CTs in early January of next year.  She will return for an office visit here in 6 months.  Rhonda Coder, MD  03/24/2020  3:30 PM

## 2020-03-24 NOTE — Patient Instructions (Signed)
Please provided a copy of her Medical Advanced Directive at next visit.

## 2020-03-25 ENCOUNTER — Telehealth: Payer: Self-pay | Admitting: Oncology

## 2020-03-25 NOTE — Telephone Encounter (Signed)
Scheduled appt per 7/13 los - unable to reach pt . Left message with appt date and time

## 2020-06-25 DIAGNOSIS — E559 Vitamin D deficiency, unspecified: Secondary | ICD-10-CM | POA: Diagnosis not present

## 2020-06-25 DIAGNOSIS — Z1331 Encounter for screening for depression: Secondary | ICD-10-CM | POA: Diagnosis not present

## 2020-06-25 DIAGNOSIS — Z136 Encounter for screening for cardiovascular disorders: Secondary | ICD-10-CM | POA: Diagnosis not present

## 2020-06-25 DIAGNOSIS — Z1159 Encounter for screening for other viral diseases: Secondary | ICD-10-CM | POA: Diagnosis not present

## 2020-06-25 DIAGNOSIS — Z131 Encounter for screening for diabetes mellitus: Secondary | ICD-10-CM | POA: Diagnosis not present

## 2020-06-25 DIAGNOSIS — Z13220 Encounter for screening for lipoid disorders: Secondary | ICD-10-CM | POA: Diagnosis not present

## 2020-06-25 DIAGNOSIS — D539 Nutritional anemia, unspecified: Secondary | ICD-10-CM | POA: Diagnosis not present

## 2020-06-25 DIAGNOSIS — Z0000 Encounter for general adult medical examination without abnormal findings: Secondary | ICD-10-CM | POA: Diagnosis not present

## 2020-06-25 DIAGNOSIS — Z23 Encounter for immunization: Secondary | ICD-10-CM | POA: Diagnosis not present

## 2020-06-25 DIAGNOSIS — Z114 Encounter for screening for human immunodeficiency virus [HIV]: Secondary | ICD-10-CM | POA: Diagnosis not present

## 2020-06-25 DIAGNOSIS — Z1329 Encounter for screening for other suspected endocrine disorder: Secondary | ICD-10-CM | POA: Diagnosis not present

## 2020-06-30 DIAGNOSIS — Z1231 Encounter for screening mammogram for malignant neoplasm of breast: Secondary | ICD-10-CM | POA: Diagnosis not present

## 2020-07-11 ENCOUNTER — Ambulatory Visit: Payer: BC Managed Care – PPO | Attending: Internal Medicine

## 2020-07-11 DIAGNOSIS — Z23 Encounter for immunization: Secondary | ICD-10-CM

## 2020-07-11 NOTE — Progress Notes (Signed)
   Covid-19 Vaccination Clinic  Name:  Rhonda Newton    MRN: [id number] DOB: 1966/07/13  07/11/2020  [name] was observed post Covid-19 immunization for 15 minutes without incident. She was provided with Vaccine Information Sheet and instruction to access the V-Safe system.   [name] was instructed to call 911 with any severe reactions post vaccine: Marland Kitchen Difficulty breathing  . Swelling of face and throat  . A fast heartbeat  . A bad rash all over body  . Dizziness and weakness

## 2020-09-17 DIAGNOSIS — C155 Malignant neoplasm of lower third of esophagus: Secondary | ICD-10-CM | POA: Diagnosis not present

## 2020-09-17 DIAGNOSIS — C159 Malignant neoplasm of esophagus, unspecified: Secondary | ICD-10-CM | POA: Diagnosis not present

## 2020-09-17 IMAGING — DX DG CHEST 2V
2 series · 2 of 2 positions shown · non-contrast
Comparison: Chest x-ray of 12/02/2009 and PET CT chest of
05/18/2017

CLINICAL DATA: Cough, shortness of breath, history of esophagectomy
in early [REDACTED]

EXAM:
CHEST - 2 VIEW

[chest pa]
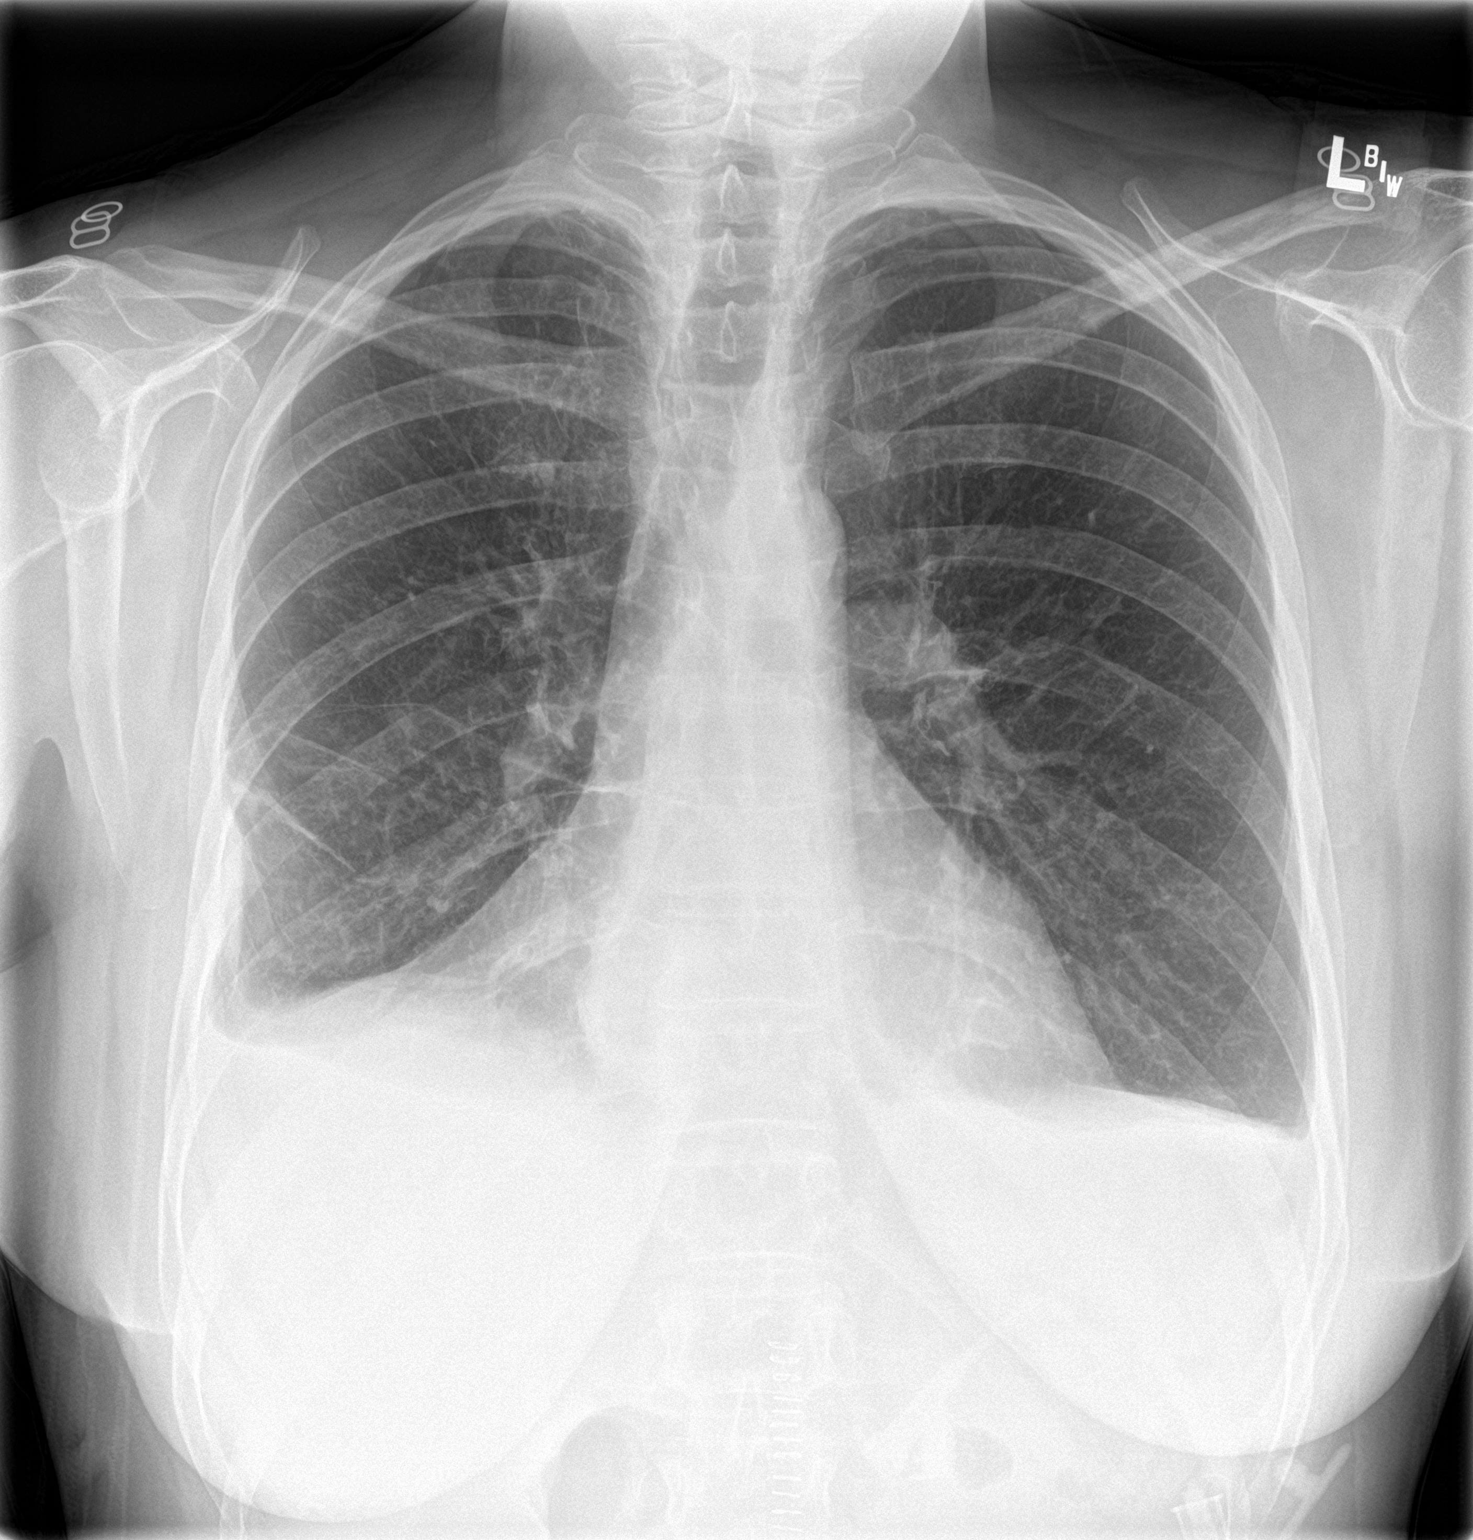

[chest lat]
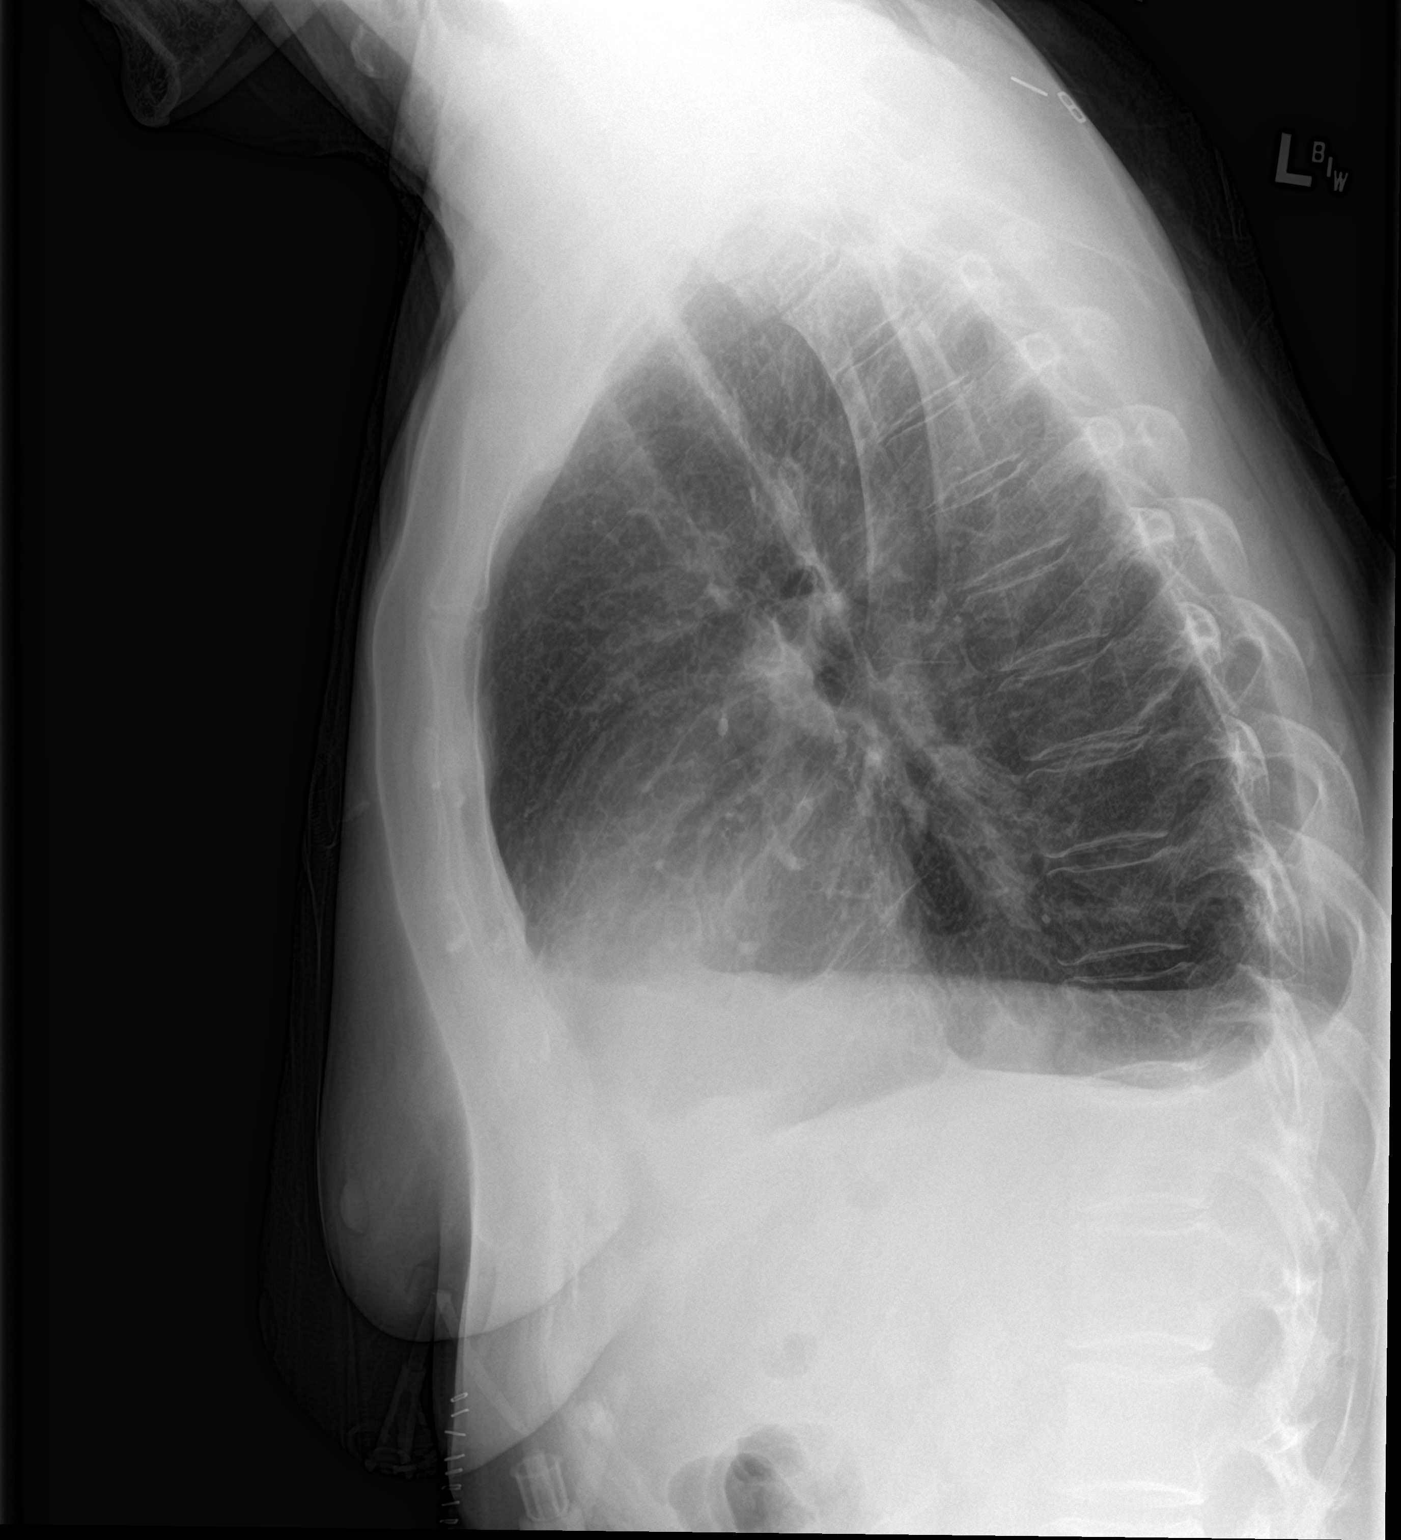

[2 of 2 positions shown; findings below may reference images not displayed]

FINDINGS: Post esophagectomy there are small pleural effusions present right
slightly larger than left with mild bibasilar atelectasis. No
pneumonia is seen. Mediastinal and hilar contours are unremarkable.
Heart size is stable. No bony abnormality is seen.
IMPRESSION: Small pleural effusions are now present bilaterally right slightly
greater than left with mild bibasilar atelectasis.

## 2020-09-25 ENCOUNTER — Telehealth: Payer: Self-pay | Admitting: Oncology

## 2020-09-25 NOTE — Telephone Encounter (Signed)
Returned call regarding rescheduling appointment per 1/14 schedule message. Spoke to patient about virtual visit versus rescheduling appointment. Patient opted for a Mychart visit. Changed appointment and patient is aware.

## 2020-09-28 ENCOUNTER — Inpatient Hospital Stay: Payer: BC Managed Care – PPO | Attending: Oncology | Admitting: Oncology

## 2020-09-28 DIAGNOSIS — C160 Malignant neoplasm of cardia: Secondary | ICD-10-CM | POA: Diagnosis not present

## 2020-09-28 NOTE — Progress Notes (Signed)
  [facility] OFFICE VISIT PROGRESS NOTE  I connected with Rhonda Newton on 09/28/20 at  3:00 PM EST by video and verified that I am speaking with the correct person using two identifiers.   I discussed the limitations, risks, security and privacy concerns of performing an evaluation and management service by telemedicine and the availability of in-person appointments. I also discussed with the patient that there may be a patient responsible charge related to this service. The patient expressed understanding and agreed to proceed.    Patient's location: Home Provider's location: Home  Diagnosis: Gastroesophageal cancer  INTERVAL HISTORY:   Rhonda Newton is seen today for a telehealth visit per her request.  She feels well.  No dysphagia.  Good appetite and energy level.  She eats frequent small meals.  She has occasional discomfort lasting for a few seconds at the abdominal incision site.  No palpable change.  She saw Dr. Elenor Quinones last week.  CTs revealed no evidence of recurrent disease.   Medications: I have reviewed the patient's current medications.  Assessment/Plan: 1. Gastroesophageal carcinoma ? 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 CTs 05/04/2018, distal esophagus mass extending to to below the GE junction, no evidence of metastatic disease, 4 mm nonspecific right upper lobe nodule ? Upper EUS 05/11/2018-large fungating ulcerating mass in the distal esophagus about 34 to 38 cm from the incisors, tumor extends to the GE junction; stagedT3 N1 MX ? Radiation 05/28/2018-07/04/2018 ? Cycle 1weeklycarboplatin/Taxol 05/29/2018 ? Cycle 2weeklycarboplatin/Taxol 06/06/2018 ? Cycle 3 weekly carboplatin/Taxol 06/12/2018 ? Cycle 4 weekly carboplatin/Taxol 06/20/2018 ? Cycle 5 weekly  carboplatin/Taxol 06/26/2018 ? PET scan 08/16/2018- decreased hypermetabolic activity and thickness of the distal esophagus consistent with treated residual neoplasm, no evidence of metastatic disease ? Esophagectomy and jejunostomy feeding tube 08/17/2018-complete pathologic response ? CTs 03/07/2019- new right middle lobe atelectasis or scarring; stable tiny right upper lobe pulmonary nodule; status post esophagogastrectomy with expected postoperative appearance; no evidence of metastatic disease in the abdomen or pelvis.  Similar right adrenal thickening. ? CT 04/08/2020-no evidence of recurrent disease ? CTs at [facility] 09/17/2020-no evidence of recurrent disease 2. Migraine headaches 3. Multiple family members with breast cancer 4. History of tobacco use   Disposition: Rhonda Newton is in clinical remission from gastroesophageal cancer.  She will be scheduled for a return visit and surveillance CTs at [facility] in January 2023.  She will return for an office visit here in 6 months.   I discussed the assessment and treatment plan with the patient. The patient was provided an opportunity to ask questions and all were answered. The patient agreed with the plan and demonstrated an understanding of the instructions.   The patient was advised to call back or seek an in-person evaluation if the symptoms worsen or if the condition fails to improve as anticipated.  I provided 15  minutes of video, chart review, and documentation time during this encounter, and > 50% was spent counseling as documented under my assessment & plan.  Betsy Coder ANP/GNP-BC   09/28/2020 3:04 PM

## 2021-04-05 ENCOUNTER — Inpatient Hospital Stay: Payer: BC Managed Care – PPO | Attending: Oncology | Admitting: Oncology

## 2021-04-05 ENCOUNTER — Other Ambulatory Visit: Payer: Self-pay

## 2021-04-05 VITALS — BP 111/57 | HR 57 | Temp 98.2°F | Resp 18 | Wt 186.4 lb

## 2021-04-05 DIAGNOSIS — Z85028 Personal history of other malignant neoplasm of stomach: Secondary | ICD-10-CM | POA: Insufficient documentation

## 2021-04-05 DIAGNOSIS — Z87891 Personal history of nicotine dependence: Secondary | ICD-10-CM | POA: Insufficient documentation

## 2021-04-05 DIAGNOSIS — C160 Malignant neoplasm of cardia: Secondary | ICD-10-CM | POA: Diagnosis not present

## 2021-04-05 DIAGNOSIS — Z803 Family history of malignant neoplasm of breast: Secondary | ICD-10-CM | POA: Insufficient documentation

## 2021-04-05 NOTE — Progress Notes (Signed)
  [facility] OFFICE PROGRESS NOTE   Diagnosis: Esophagus cancer  INTERVAL HISTORY:   Rhonda Newton returns as scheduled.  She feels well.  No dysphagia.  She has early satiety when eating a large meal with bread.  She is scheduled to see Dr.D'amico in January.  Objective:  Vital signs in last 24 hours:  Blood pressure (!) 111/57, pulse (!) 57, temperature 98.2 F (36.8 C), temperature source Oral, resp. rate 18, weight 186 lb 6.4 oz (84.6 kg), last menstrual period 02/02/2012, SpO2 99 %.    HEENT: Neck without mass Lymphatics: No cervical, supraclavicular, axillary, or inguinal nodes Resp: Lungs clear bilaterally Cardio: Regular rate and rhythm GI: No hepatosplenomegaly, nontender, no mass Vascular: No leg edema   Lab Results:  Lab Results  Component Value Date   WBC 6.0 09/11/2018   HGB 12.4 09/11/2018   HCT 39.8 09/11/2018   MCV 94.8 09/11/2018   PLT 339 09/11/2018   NEUTROABS 4.1 09/11/2018    CMP  Lab Results  Component Value Date   NA 138 09/11/2018   K 3.9 09/11/2018   CL 101 09/11/2018   CO2 25 09/11/2018   GLUCOSE 92 09/11/2018   BUN 15 09/11/2018   CREATININE 0.70 09/11/2018   CALCIUM 9.6 09/11/2018   PROT 7.9 09/11/2018   ALBUMIN 3.9 09/11/2018   AST 19 09/11/2018   ALT 20 09/11/2018   ALKPHOS 87 09/11/2018   BILITOT 0.4 09/11/2018   GFRNONAA >60 09/11/2018   GFRAA >60 09/11/2018    Medications: I have reviewed the patient's current medications.   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; HER2 negative CTs 05/04/2018, distal esophagus mass extending to to below the GE junction, no evidence of metastatic disease, 4 mm nonspecific right upper lobe nodule Upper EUS 05/11/2018- large fungating ulcerating mass in the distal esophagus about 34 to 38 cm  from the incisors, tumor extends to the GE junction; staged T3 N1 MX Radiation 05/28/2018-07/04/2018 Cycle 1 weekly carboplatin/Taxol 05/29/2018 Cycle 2 weekly carboplatin/Taxol 06/06/2018 Cycle 3 weekly carboplatin/Taxol 06/12/2018 Cycle 4 weekly carboplatin/Taxol 06/20/2018 Cycle 5 weekly carboplatin/Taxol 06/26/2018 PET scan 08/16/2018- decreased hypermetabolic activity and thickness of the distal esophagus consistent with treated residual neoplasm, no evidence of metastatic disease Esophagectomy and jejunostomy feeding tube 08/17/2018-complete pathologic response CTs 03/07/2019- new right middle lobe atelectasis or scarring; stable tiny right upper lobe pulmonary nodule; status post esophagogastrectomy with expected postoperative appearance; no evidence of metastatic disease in the abdomen or pelvis.  Similar right adrenal thickening. CT 04/08/2020-no evidence of recurrent disease CTs at [facility] 09/17/2020-no evidence of recurrent disease Migraine headaches Multiple family members with breast cancer History of tobacco use    Disposition: Rhonda Newton is in clinical remission from gastroesophageal cancer.  She will see Dr. Elenor Quinones with restaging CTs in January.  She will return for an office visit in 1 year.  Rhonda Coder, MD  04/05/2021  3:19 PM

## 2021-05-01 DIAGNOSIS — Z20822 Contact with and (suspected) exposure to covid-19: Secondary | ICD-10-CM | POA: Diagnosis not present

## 2021-05-02 DIAGNOSIS — R509 Fever, unspecified: Secondary | ICD-10-CM | POA: Diagnosis not present

## 2021-05-02 DIAGNOSIS — U071 COVID-19: Secondary | ICD-10-CM | POA: Diagnosis not present

## 2021-05-02 DIAGNOSIS — C159 Malignant neoplasm of esophagus, unspecified: Secondary | ICD-10-CM | POA: Diagnosis not present

## 2021-05-02 DIAGNOSIS — Z20822 Contact with and (suspected) exposure to covid-19: Secondary | ICD-10-CM | POA: Diagnosis not present

## 2021-05-05 DIAGNOSIS — U071 COVID-19: Secondary | ICD-10-CM | POA: Diagnosis not present

## 2021-05-05 DIAGNOSIS — C159 Malignant neoplasm of esophagus, unspecified: Secondary | ICD-10-CM | POA: Diagnosis not present

## 2021-05-14 ENCOUNTER — Other Ambulatory Visit: Payer: Self-pay

## 2021-05-14 ENCOUNTER — Encounter (HOSPITAL_BASED_OUTPATIENT_CLINIC_OR_DEPARTMENT_OTHER): Payer: Self-pay | Admitting: Family Medicine

## 2021-05-14 ENCOUNTER — Ambulatory Visit (INDEPENDENT_AMBULATORY_CARE_PROVIDER_SITE_OTHER): Payer: BC Managed Care – PPO | Admitting: Family Medicine

## 2021-05-14 VITALS — BP 120/76 | HR 64 | Ht 68.5 in | Wt 183.8 lb

## 2021-05-14 DIAGNOSIS — Z0000 Encounter for general adult medical examination without abnormal findings: Secondary | ICD-10-CM

## 2021-05-14 DIAGNOSIS — G43909 Migraine, unspecified, not intractable, without status migrainosus: Secondary | ICD-10-CM | POA: Diagnosis not present

## 2021-05-14 DIAGNOSIS — C155 Malignant neoplasm of lower third of esophagus: Secondary | ICD-10-CM

## 2021-05-14 NOTE — Progress Notes (Signed)
New Patient Office Visit  Subjective:  Patient ID: Rhonda Newton, female    DOB: 1966-04-11  Age: 55 y.o. MRN: [id number]  CC:  Chief Complaint  Patient presents with   Establish Care    Prior PCP - [facility]. No specific concerns or complaints today.    HPI Rhonda Newton is a 55 year old female presenting to establish in clinic.  Denies any specific concerns or complaints today.  Past medical history significant for GE junction carcinoma.  GE junction carcinoma: Follows with Dr. Benay Spice here in the [place].  She completed prior therapy with radiation, chemotherapy.  She is currently in clinical remission.  At present she also follows with provider at [facility] for follow-up CT scans for monitoring.  Patient also reports history of migraines for which she will take sumatriptan as needed. Patient follows with [facility], had Pap smear completed in 2022.  Also reports having mammogram completed at [facility] in 2022 Patient follows with [facility] regarding ophthalmology follow-up  Past Medical History:  Diagnosis Date   Anxiety    Anxiety and depression    Barrett's esophagus 05/18/2018   Cataract 2002   Depression    Family history of breast cancer    Family history of colon cancer    Family history of melanoma    Family history of prostate cancer    GE junction carcinoma ([place]) 05/18/2018    Past Surgical History:  Procedure Laterality Date   cataracts Bilateral    age 7   INTRAUTERINE DEVICE INSERTION     Paraguard    Family History  Problem Relation Age of Onset   Barrett's esophagus Mother    Hypertension Mother    Breast cancer Mother 53   Breast cancer Sister 46   Breast cancer Maternal Aunt 67   Barrett's esophagus Maternal Aunt    Breast cancer Paternal Aunt        dx in her 39s   Heart disease Father    Prostate cancer Maternal Uncle        dx in his mid 19s   COPD Paternal Uncle    Heart disease Maternal Grandmother     Heart disease Maternal Grandfather    Congestive Heart Failure Paternal Grandmother    Dementia Paternal Grandmother    Kidney cancer Paternal Grandfather    Melanoma Cousin        mat first cousin; dx 85s-40s   Prostate cancer Cousin        mat first cousin dx in his mid 10s   Breast cancer Other        PGMs sister   Colon cancer Other        PGMs sister   Pancreatic cancer Other        MGFs sister    Social History   Socioeconomic History   Marital status: Divorced    Spouse name: Not on file   Number of children: Not on file   Years of education: Not on file   Highest education level: Not on file  Occupational History   Not on file  Tobacco Use   Smoking status: Former    Packs/day: 1.00    Types: Cigarettes    Quit date: 05/14/2018    Years since quitting: 3.1   Smokeless tobacco: Never  Vaping Use   Vaping Use: Never used  Substance and Sexual Activity   Alcohol use: No    Comment: occasional   Drug use: Not Currently  Types: Marijuana   Sexual activity: Yes    Birth control/protection: I.U.D.  Other Topics Concern   Not on file  Social History Narrative   Not on file   Social Determinants of Health   Financial Resource Strain: Not on file  Food Insecurity: Not on file  Transportation Needs: Not on file  Physical Activity: Not on file  Stress: Not on file  Social Connections: Not on file  Intimate Partner Violence: Not on file    Objective:   Today's Vitals: BP 120/76   Pulse 64   Ht 5' 8.5" (1.74 m)   Wt 183 lb 12.8 oz (83.4 kg)   LMP 02/02/2012   SpO2 98%   BMI 27.54 kg/m   Physical Exam  55 year old female in no acute distress Cardiovascular exam with regular rate and rhythm, no murmurs appreciated Lungs clear to auscultation bilaterally  Assessment & Plan:   Problem List Items Addressed This Visit       Cardiovascular and Mediastinum   Migraine    History of migraines, manages with use of ibuprofen as needed or  sumatriptan Continue with current medications        Digestive   Malignant neoplasm of lower third of esophagus ([place])    History of GE junction carcinoma, following with oncology, has CT scans completed annually with [facility] Continue follow-up with specialist as well as any recommended follow-up imaging studies      Other Visit Diagnoses     Wellness examination    -  Primary   Relevant Orders   CBC with Differential/Platelet (Completed)   Comprehensive metabolic panel (Completed)   Lipid panel (Completed)   TSH Rfx on Abnormal to Free T4 (Completed)       Outpatient Encounter Medications as of 05/14/2021  Medication Sig   ibuprofen (ADVIL) 200 MG tablet Take 200 mg by mouth every 6 (six) hours as needed.   [DISCONTINUED] SUMAtriptan (IMITREX) 25 MG tablet Take 25 mg by mouth 3 times/day as needed-between meals & bedtime.   [DISCONTINUED] diphenhydrAMINE HCl (BENADRYL PO) Take 25 mg by mouth as needed. (Patient not taking: Reported on 04/05/2021)   No facility-administered encounter medications on file as of 05/14/2021.    Follow-up: No follow-ups on file.  Plan for follow-up in about 4 to 6 weeks to complete CPE.  Will have patient complete labs prior to that appointment and these will be reviewed at that time  Talishia Betzler J De [place], MD

## 2021-05-14 NOTE — Patient Instructions (Signed)
  Medication Instructions:  Your physician recommends that you continue on your current medications as directed. Please refer to the Current Medication list given to you today. --If you need a refill on any your medications before your next appointment, please call your pharmacy first. If no refills are authorized on file call the office.--  Lab Work: Your physician has recommended that you have lab work today: CBC, CMP, Lipid Profile, and Thyroid Level If you have labs (blood work) drawn today and your tests are completely normal, you will receive your results via [facility] a phone call from our staff.  Please ensure you check your voicemail in the event that you authorized detailed messages to be left on a delegated number. If you have any lab test that is abnormal or we need to change your treatment, we will call you to review the results.  Follow-Up: Your next appointment:   Your physician recommends that you schedule a follow-up appointment as scheduled in October with Dr. de [place]  Thanks for letting us be apart of your health journey!!  Primary Care and Sports Medicine   Dr. Arlina Robes [place]   We encourage you to activate your patient portal called "MyChart".  Sign up information is provided on this After Visit Summary.  MyChart is used to connect with patients for Virtual Visits (Telemedicine).  Patients are able to view lab/test results, encounter notes, upcoming appointments, etc.  Non-urgent messages can be sent to your provider as well. To learn more about what you can do with MyChart, please visit --  [email or web address].

## 2021-05-15 LAB — CBC WITH DIFFERENTIAL/PLATELET
Basophils Absolute: 0.1 10*3/uL (ref 0.0–0.2)
Basos: 1 %
EOS (ABSOLUTE): 0.1 10*3/uL (ref 0.0–0.4)
Eos: 2 %
Hematocrit: 42.3 % (ref 34.0–46.6)
Hemoglobin: 13.7 g/dL (ref 11.1–15.9)
Immature Grans (Abs): 0 10*3/uL (ref 0.0–0.1)
Immature Granulocytes: 0 %
Lymphocytes Absolute: 1.8 10*3/uL (ref 0.7–3.1)
Lymphs: 23 %
MCH: 28.8 pg (ref 26.6–33.0)
MCHC: 32.4 g/dL (ref 31.5–35.7)
MCV: 89 fL (ref 79–97)
Monocytes Absolute: 0.8 10*3/uL (ref 0.1–0.9)
Monocytes: 10 %
Neutrophils Absolute: 5 10*3/uL (ref 1.4–7.0)
Neutrophils: 64 %
Platelets: 338 10*3/uL (ref 150–450)
RBC: 4.76 x10E6/uL (ref 3.77–5.28)
RDW: 13 % (ref 11.7–15.4)
WBC: 7.9 10*3/uL (ref 3.4–10.8)

## 2021-05-15 LAB — LIPID PANEL
Chol/HDL Ratio: 5 ratio — ABNORMAL HIGH (ref 0.0–4.4)
Cholesterol, Total: 198 mg/dL (ref 100–199)
HDL: 40 mg/dL (ref >39)
LDL Chol Calc (NIH): 95 mg/dL (ref 0–99)
Triglycerides: 375 mg/dL — ABNORMAL HIGH (ref 0–149)
VLDL Cholesterol Cal: 63 mg/dL — ABNORMAL HIGH (ref 5–40)

## 2021-05-15 LAB — COMPREHENSIVE METABOLIC PANEL
ALT: 18 IU/L (ref 0–32)
AST: 17 IU/L (ref 0–40)
Albumin/Globulin Ratio: 2.2 (ref 1.2–2.2)
Albumin: 4.4 g/dL (ref 3.8–4.9)
Alkaline Phosphatase: 106 IU/L (ref 44–121)
BUN/Creatinine Ratio: 15 (ref 9–23)
BUN: 16 mg/dL (ref 6–24)
Bilirubin Total: 0.4 mg/dL (ref 0.0–1.2)
CO2: 22 mmol/L (ref 20–29)
Calcium: 9 mg/dL (ref 8.7–10.2)
Chloride: 101 mmol/L (ref 96–106)
Creatinine, Ser: 1.06 mg/dL — ABNORMAL HIGH (ref 0.57–1.00)
Globulin, Total: 2 g/dL (ref 1.5–4.5)
Glucose: 82 mg/dL (ref 65–99)
Potassium: 4.2 mmol/L (ref 3.5–5.2)
Sodium: 140 mmol/L (ref 134–144)
Total Protein: 6.4 g/dL (ref 6.0–8.5)
eGFR: 63 mL/min/{1.73_m2} (ref >59)

## 2021-05-15 LAB — TSH RFX ON ABNORMAL TO FREE T4: TSH: 0.721 u[IU]/mL (ref 0.450–4.500)

## 2021-06-03 DIAGNOSIS — Z6828 Body mass index (BMI) 28.0-28.9, adult: Secondary | ICD-10-CM | POA: Diagnosis not present

## 2021-06-03 DIAGNOSIS — Z01419 Encounter for gynecological examination (general) (routine) without abnormal findings: Secondary | ICD-10-CM | POA: Diagnosis not present

## 2021-06-03 DIAGNOSIS — Z30431 Encounter for routine checking of intrauterine contraceptive device: Secondary | ICD-10-CM | POA: Diagnosis not present

## 2021-06-21 ENCOUNTER — Other Ambulatory Visit (HOSPITAL_BASED_OUTPATIENT_CLINIC_OR_DEPARTMENT_OTHER): Payer: Self-pay

## 2021-06-21 MED ORDER — SUMATRIPTAN SUCCINATE 25 MG PO TABS
25.0000 mg | ORAL_TABLET | Freq: Two times a day (BID) | ORAL | 0 refills | Status: DC | PRN
Start: 2021-06-21 — End: 2021-06-29

## 2021-06-21 NOTE — Telephone Encounter (Signed)
Patient called and left voicemail requesting renewal of Sumitriptan Patient scheduled to follow up on 10/18, ok to authorize

## 2021-06-29 ENCOUNTER — Encounter (HOSPITAL_BASED_OUTPATIENT_CLINIC_OR_DEPARTMENT_OTHER): Payer: Self-pay | Admitting: Family Medicine

## 2021-06-29 ENCOUNTER — Other Ambulatory Visit: Payer: Self-pay

## 2021-06-29 ENCOUNTER — Ambulatory Visit (INDEPENDENT_AMBULATORY_CARE_PROVIDER_SITE_OTHER): Payer: BC Managed Care – PPO | Admitting: Family Medicine

## 2021-06-29 VITALS — BP 112/70 | HR 73 | Ht 69.0 in | Wt 189.0 lb

## 2021-06-29 DIAGNOSIS — R7989 Other specified abnormal findings of blood chemistry: Secondary | ICD-10-CM

## 2021-06-29 DIAGNOSIS — Z23 Encounter for immunization: Secondary | ICD-10-CM

## 2021-06-29 DIAGNOSIS — G43909 Migraine, unspecified, not intractable, without status migrainosus: Secondary | ICD-10-CM | POA: Insufficient documentation

## 2021-06-29 DIAGNOSIS — Z0000 Encounter for general adult medical examination without abnormal findings: Secondary | ICD-10-CM | POA: Diagnosis not present

## 2021-06-29 MED ORDER — SUMATRIPTAN SUCCINATE 25 MG PO TABS: 25.0000 mg | ORAL_TABLET | Freq: Two times a day (BID) | ORAL | 11 refills | Status: DC | PRN

## 2021-06-29 NOTE — Progress Notes (Signed)
Subjective:    CC: Annual Physical Exam  HPI:  Rhonda Newton is a 55 y.o. presenting for annual physical  I reviewed the past medical history, family history, social history, surgical history, and allergies today and no changes were needed.  Please see the problem list section below in epic for further details.  Past Medical History: Past Medical History:  Diagnosis Date   Anxiety    Anxiety and depression    Barrett's esophagus 05/18/2018   Cataract 2002   Depression    Family history of breast cancer    Family history of colon cancer    Family history of melanoma    Family history of prostate cancer    GE junction carcinoma ([place]) 05/18/2018   Past Surgical History: Past Surgical History:  Procedure Laterality Date   cataracts Bilateral    age 59   INTRAUTERINE DEVICE INSERTION     Paraguard   Social History: Social History   Socioeconomic History   Marital status: Divorced    Spouse name: Not on file   Number of children: Not on file   Years of education: Not on file   Highest education level: Not on file  Occupational History   Not on file  Tobacco Use   Smoking status: Former    Packs/day: 1.00    Types: Cigarettes    Quit date: 05/14/2018    Years since quitting: 3.1   Smokeless tobacco: Never  Vaping Use   Vaping Use: Never used  Substance and Sexual Activity   Alcohol use: No    Comment: occasional   Drug use: Not Currently    Types: Marijuana   Sexual activity: Yes    Birth control/protection: I.U.D.  Other Topics Concern   Not on file  Social History Narrative   Not on file   Social Determinants of Health   Financial Resource Strain: Not on file  Food Insecurity: Not on file  Transportation Needs: Not on file  Physical Activity: Not on file  Stress: Not on file  Social Connections: Not on file   Family History: Family History  Problem Relation Age of Onset   Barrett's esophagus Mother    Hypertension Mother    Breast cancer Mother  24   Breast cancer Sister 36   Breast cancer Maternal Aunt 67   Barrett's esophagus Maternal Aunt    Breast cancer Paternal Aunt        dx in her 49s   Heart disease Father    Prostate cancer Maternal Uncle        dx in his mid 37s   COPD Paternal Uncle    Heart disease Maternal Grandmother    Heart disease Maternal Grandfather    Congestive Heart Failure Paternal Grandmother    Dementia Paternal Grandmother    Kidney cancer Paternal Grandfather    Melanoma Cousin        mat first cousin; dx 93s-40s   Prostate cancer Cousin        mat first cousin dx in his mid 51s   Breast cancer Other        PGMs sister   Colon cancer Other        PGMs sister   Pancreatic cancer Other        MGFs sister   Allergies: Allergies  Allergen Reactions   Benzodiazepines     Clouding of cognition and disinhibition   Medications: See med rec.  Review of Systems: No headache, visual changes, nausea, vomiting,  diarrhea, constipation, dizziness, abdominal pain, skin rash, fevers, chills, night sweats, swollen lymph nodes, weight loss, chest pain, body aches, joint swelling, muscle aches, shortness of breath, mood changes, visual or auditory hallucinations.  Objective:    General: Well Developed, well nourished, and in no acute distress.  Neuro: Alert and oriented x3, extra-ocular muscles intact, sensation grossly intact. Cranial nerves II through XII are intact, motor, sensory, and coordinative functions are all intact. HEENT: Normocephalic, atraumatic, pupils equal round reactive to light, neck supple, no masses, no lymphadenopathy, thyroid nonpalpable. Oropharynx, nasopharynx, external ear canals are unremarkable. Skin: Warm and dry, no rashes noted.  Cardiac: Regular rate and rhythm, no murmurs rubs or gallops.  Respiratory: Clear to auscultation bilaterally. Not using accessory muscles, speaking in full sentences.  Abdominal: Soft, nontender, nondistended, positive bowel sounds, no masses, no  organomegaly.  Musculoskeletal: Shoulder, elbow, wrist, hip, knee, ankle stable, and with full range of motion.  Impression and Recommendations:    Wellness examination The patient was counseled, risk factors were discussed, anticipatory guidance given. Reviewed recent labs with patient today Discussed health maintenance recommendations including recommendations for influenza vaccine -patient understood, vaccine administered today Patient is up-to-date on colon cancer screening -was previously following with [facility], plans to switch to [facility], reports recommendation of 5 years for next colonoscopy which would be in 2024 Patient follows with OB/GYN, has had recent Pap smear, scheduled to have mammogram later this month, she has requested for records to be sent to [place] We will also refill sumatriptan for management of migraines  We will check BMP today due to elevated creatinine on most recent labs Plan for follow-up in 4 to 6 months, will check lipid panel at that time to monitor hypertriglyceridemia   ___________________________________________ Otto Felkins de [place], MD, ABFM, CAQSM Primary Care and [place]

## 2021-06-29 NOTE — Assessment & Plan Note (Signed)
History of GE junction carcinoma, following with oncology, has CT scans completed annually with [facility] Continue follow-up with specialist as well as any recommended follow-up imaging studies

## 2021-06-29 NOTE — Assessment & Plan Note (Addendum)
The patient was counseled, risk factors were discussed, anticipatory guidance given. Reviewed recent labs with patient today Discussed health maintenance recommendations including recommendations for influenza vaccine -patient understood, vaccine administered today Patient is up-to-date on colon cancer screening -was previously following with [facility], plans to switch to [facility], reports recommendation of 5 years for next colonoscopy which would be in 2024 Patient follows with OB/GYN, has had recent Pap smear, scheduled to have mammogram later this month, she has requested for records to be sent to [place] We will also refill sumatriptan for management of migraines

## 2021-06-29 NOTE — Assessment & Plan Note (Signed)
History of migraines, manages with use of ibuprofen as needed or sumatriptan Continue with current medications

## 2021-06-29 NOTE — Patient Instructions (Signed)
  Medication Instructions:  Your physician recommends that you continue on your current medications as directed. Please refer to the Current Medication list given to you today. --If you need a refill on any your medications before your next appointment, please call your pharmacy first. If no refills are authorized on file call the office.-- Lab Work: Your physician has recommended that you have lab work today: BMP If you have labs (blood work) drawn today and your tests are completely normal, you will receive your results via Keyes a phone call from our staff.  Please ensure you check your voicemail in the event that you authorized detailed messages to be left on a delegated number. If you have any lab test that is abnormal or we need to change your treatment, we will call you to review the results.  Follow-Up: Your next appointment:   Your physician recommends that you schedule a follow-up appointment in: 4-6 MONTHS with Dr. de [place]  You will receive a text message or e-mail with a link to a survey about your care and experience with [place] today! We would greatly appreciate your feedback!   Thanks for letting us be apart of your health journey!!  Primary Care and Sports Medicine   Dr. Arlina Robes [place]   We encourage you to activate your patient portal called "MyChart".  Sign up information is provided on this After Visit Summary.  MyChart is used to connect with patients for Virtual Visits (Telemedicine).  Patients are able to view lab/test results, encounter notes, upcoming appointments, etc.  Non-urgent messages can be sent to your provider as well. To learn more about what you can do with MyChart, please visit --  [email or web address].

## 2021-06-30 LAB — BASIC METABOLIC PANEL
BUN/Creatinine Ratio: 15 (ref 9–23)
BUN: 13 mg/dL (ref 6–24)
CO2: 22 mmol/L (ref 20–29)
Calcium: 9.2 mg/dL (ref 8.7–10.2)
Chloride: 106 mmol/L (ref 96–106)
Creatinine, Ser: 0.87 mg/dL (ref 0.57–1.00)
Glucose: 88 mg/dL (ref 70–99)
Potassium: 4.2 mmol/L (ref 3.5–5.2)
Sodium: 144 mmol/L (ref 134–144)
eGFR: 79 mL/min/{1.73_m2} (ref >59)

## 2021-06-30 LAB — SPECIMEN STATUS REPORT

## 2021-07-06 DIAGNOSIS — Z1231 Encounter for screening mammogram for malignant neoplasm of breast: Secondary | ICD-10-CM | POA: Diagnosis not present

## 2021-08-10 ENCOUNTER — Telehealth: Payer: Self-pay

## 2021-08-10 NOTE — Telephone Encounter (Signed)
Spoke with pt regarding her recent appt request. Pt states that "I may have a hernia close to where my surgery was". This RN advised pt to call her surgeon at [facility] for an appointment to be evaluated. Pt is aware that she can call her PCP to be evaluated if needed as well. Pt verbalizes understanding and appreciative of call back.

## 2021-08-12 DIAGNOSIS — R918 Other nonspecific abnormal finding of lung field: Secondary | ICD-10-CM | POA: Diagnosis not present

## 2021-08-12 DIAGNOSIS — C155 Malignant neoplasm of lower third of esophagus: Secondary | ICD-10-CM | POA: Diagnosis not present

## 2021-08-12 DIAGNOSIS — C159 Malignant neoplasm of esophagus, unspecified: Secondary | ICD-10-CM | POA: Diagnosis not present

## 2021-08-25 ENCOUNTER — Other Ambulatory Visit (HOSPITAL_BASED_OUTPATIENT_CLINIC_OR_DEPARTMENT_OTHER): Payer: Self-pay

## 2021-08-30 ENCOUNTER — Ambulatory Visit (INDEPENDENT_AMBULATORY_CARE_PROVIDER_SITE_OTHER): Payer: BC Managed Care – PPO | Admitting: Family Medicine

## 2021-08-30 ENCOUNTER — Encounter (HOSPITAL_BASED_OUTPATIENT_CLINIC_OR_DEPARTMENT_OTHER): Payer: Self-pay | Admitting: Family Medicine

## 2021-08-30 ENCOUNTER — Other Ambulatory Visit: Payer: Self-pay

## 2021-08-30 DIAGNOSIS — H8110 Benign paroxysmal vertigo, unspecified ear: Secondary | ICD-10-CM | POA: Insufficient documentation

## 2021-08-30 NOTE — Patient Instructions (Signed)
°  Medication Instructions:  Your physician recommends that you continue on your current medications as directed. Please refer to the Current Medication list given to you today. --If you need a refill on any your medications before your next appointment, please call your pharmacy first. If no refills are authorized on file call the office.--  Referrals/Procedures/Imaging: A referral has been placed for you to Neurorehabilitation at [facility] for evaluation and treatment. Someone from the scheduling department will be in contact with you in regards to coordinating your consultation. If you do not hear from any of the schedulers within 7-10 business days please give their office a call.  Follow-Up: Your next appointment:   Your physician recommends that you schedule a follow-up appointment in: 1 MONTH with Dr. de [place]  You will receive a text message or e-mail with a link to a survey about your care and experience with [place] today! We would greatly appreciate your feedback!   Thanks for letting us be apart of your health journey!!  Primary Care and Sports Medicine   Dr. Arlina Robes [place]   We encourage you to activate your patient portal called "MyChart".  Sign up information is provided on this After Visit Summary.  MyChart is used to connect with patients for Virtual Visits (Telemedicine).  Patients are able to view lab/test results, encounter notes, upcoming appointments, etc.  Non-urgent messages can be sent to your provider as well. To learn more about what you can do with MyChart, please visit --  [email or web address].

## 2021-08-30 NOTE — Assessment & Plan Note (Signed)
°  No tunnel vision or blurry vision No associated nausea or vomiting On exam, heart with regular  With

## 2021-08-30 NOTE — Progress Notes (Unsigned)
° ° °  Procedures performed today:    None.  Independent interpretation of notes and tests performed by another provider:   None.  Brief History, Exam, Impression, and Recommendations:    BP 120/80    Pulse 62    Ht 5' 8.5" (1.74 m)    Wt 184 lb 14.4 oz (83.9 kg)    LMP 02/02/2012    SpO2 98%    BMI 27.71 kg/m   No problem-specific Assessment & Plan notes found for this encounter.     ___________________________________________ Hend Mccarrell de [place], MD, ABFM, CAQSM Primary Care and [place]

## 2021-09-01 ENCOUNTER — Ambulatory Visit: Payer: BC Managed Care – PPO | Attending: Family Medicine | Admitting: Physical Therapy

## 2021-09-01 ENCOUNTER — Other Ambulatory Visit: Payer: Self-pay

## 2021-09-01 DIAGNOSIS — R2681 Unsteadiness on feet: Secondary | ICD-10-CM | POA: Diagnosis not present

## 2021-09-01 DIAGNOSIS — R42 Dizziness and giddiness: Secondary | ICD-10-CM | POA: Insufficient documentation

## 2021-09-01 DIAGNOSIS — H8110 Benign paroxysmal vertigo, unspecified ear: Secondary | ICD-10-CM | POA: Diagnosis not present

## 2021-09-01 NOTE — Addendum Note (Signed)
Addended by: Frazier Butt on: 09/01/2021 12:09 PM   Modules accepted: Orders

## 2021-09-01 NOTE — Therapy (Signed)
[facility] [address] [place], [place], [address] Phone: [phone]   Fax:  [phone]  Physical Therapy Evaluation  Patient Details  Name: Rhonda Newton MRN: [id number] Date of Birth: [birth year] Referring Provider (PT): Raymond J de [place], MD   Encounter Date: 09/01/2021   PT End of Session - 09/01/21 0759     Visit Number 1    Number of Visits 9    Date for PT Re-Evaluation 09/24/21    Authorization Type BCBS    PT Start Time 0802    PT Stop Time 0849    PT Time Calculation (min) 47 min    Activity Tolerance Patient tolerated treatment well    Behavior During Therapy [facility] for tasks assessed/performed             Past Medical History:  Diagnosis Date   Anxiety    Anxiety and depression    Barrett's esophagus 05/18/2018   Cataract 2002   Depression    Family history of breast cancer    Family history of colon cancer    Family history of melanoma    Family history of prostate cancer    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    Subjective Assessment - 09/01/21 0803     Subjective Have had vertigo in the past, my equilibrium is off, for about 6 weeks.  Feel pulling to the right, off-balance to the right with quick turns in hallway, especially if it's dark.    Patient Stated Goals To get rid of the unsteadiness    Currently in Pain? No/denies                [facility] PT Assessment - 09/01/21 0001       Assessment   Medical Diagnosis BPPV, unspecified laterality    Referring Provider (PT) Raymond J de [place], MD    Onset Date/Surgical Date 08/30/21   MD referral; pt reports onset approx 6 weeks ago     Precautions   Precautions Other (comment)    Precaution Comments hx of esophageal cancer      Balance Screen   Has the  patient fallen in the past 6 months No    Has the patient had a decrease in activity level because of a fear of falling?  No    Is the patient reluctant to leave their home because of a fear of falling?  No                    Vestibular Assessment - 09/01/21 0001       Symptom Behavior   Subjective history of current problem 6 weeks ago-imbalance, feel off balance, dysequilibrium; not all the time, worse in the mornings; had Covid in August, no other infections    Type of Dizziness  Imbalance;Unsteady with head/body turns   loss of balance, unsteadiness towards R with walking in dark hallway; more when she is still; feels like she is swaying   Frequency of Dizziness multiple times during the day    Duration of Dizziness consistently, longer than minutes.  Not dizzy maybe an hour or two a day    Symptom Nature Variable   occurs most in sitting or standing still  Aggravating Factors Mornings;Lying supine;Turning body quickly;Rolling to right   Walking in dark hallway   Relieving Factors Slow movements   Movement   Progression of Symptoms Worse   gradually gotten a little worse   History of similar episodes about 12 years ago-everything spinning going down to the right side.  Was medication related; stopped after medication stopped.  Pt does have hx of migraines      Oculomotor Exam   Oculomotor Alignment Normal    Ocular ROM WNL    Spontaneous Absent    Gaze-induced  Absent    Head shaking Horizontal Absent    Head Shaking Vertical Absent   more symptoms of imbalance; more difficulty for pt to relax, so vertical performed at slightly slower pace   Smooth Pursuits Comment   increase in symptoms with vertical motions on R side   Saccades Slow   increase symptoms with slowed L saccade returning to midline; WNL to right     Vestibulo-Ocular Reflex   VOR 1 Head Only (x 1 viewing) 5/10 symptoms, "sloshing around" horizontal; 4/10 symptoms vertical    VOR Cancellation Normal   more  unsteadiness, increase in symptoms 8/10     Positional Testing   Dix-Hallpike Dix-Hallpike Right;Dix-Hallpike Left    Horizontal Canal Testing Horizontal Canal Right;Horizontal Canal Left      Dix-Hallpike Right   Dix-Hallpike Right Duration <15 sec   increase in symptoms to 5/10   Dix-Hallpike Right Symptoms No nystagmus      Dix-Hallpike Left   Dix-Hallpike Left Duration no nystagmus    Dix-Hallpike Left Symptoms No nystagmus   slight symtpoms upon return to long sit, 3/10     Horizontal Canal Right   Horizontal Canal Right Duration 30-60 sec    Horizontal Canal Right Symptoms Normal   5-6/10, rates 3/10 after approx 1 minute.  No nystagmus noted     Horizontal Canal Left   Horizontal Canal Left Duration 30-60 sec    Horizontal Canal Left Symptoms Normal   no nystagmus noted, mild symptoms brought on               Objective measurements completed on examination: See above findings.                PT Education - 09/01/21 1146     Education Details PT eval results, POC; initiated HEP for gaze stabilization    Person(s) Educated Patient    Methods Explanation;Demonstration;Handout    Comprehension Verbalized understanding;Returned demonstration              PT Short Term Goals - 09/01/21 1202       PT SHORT TERM GOAL #1   Title = LTGs               PT Long Term Goals - 09/01/21 1202       PT LONG TERM GOAL #1   Title Pt will be independent with HEP for improved balance and functional mobility.  TARGET 09/24/2021    Time 4    Period Weeks    Status New      PT LONG TERM GOAL #2   Title Pt will report less than or equal to 2/10 dizziness with functional activities.    Baseline 5-6/10, up to 8/10 at eval    Time 4    Period Weeks    Status New      PT LONG TERM GOAL #3   Title Balance testing (DGI vs. FGA) to be  performed and goal written as appropriate.    Time 4    Period Weeks    Status New      PT LONG TERM GOAL #4   Title  Modified CTSIB testing to be performing for balance systems assessment and goal to be written as appropriate.    Time 4    Period Weeks    Status New                    Plan - 09/01/21 1149     Clinical Impression Statement Pt is a 55 year old female who presents to OPPT with approximate 6 week history of unsteadiness, imbalance, dysequilibrium.  She does not have any reports of specific mechanism to bring on dizziness such as fall or illness.  She presents today with imbalance, motion sensitivity with smooth pursuits and with gaze stabilization to the right side.  She has increase in symptoms of imbalance with rolling to the right side and with Dix-Hallpike, though no nystagmus noted/no spinning sensation reported.  Without nystagmus, with reports of symptoms lasting the majority of the day (versus short bouts lasting seconds/minutes), pt is likely not experiencing positional vertigo.  She is demonstrating slowed saccades from L back towards midline, general increase in symptoms with rolling and head motions to the right side, as well as reported pulling to the right/decreased balance with quick motions in darkened areas, pt's symptoms may be a result of motion sensitivity and multi-system balance deficits.  Further balance testing to be assessed next visit.    Personal Factors and Comorbidities Comorbidity 3+    Comorbidities Bipolar disorder, malignant neoplasm of lower third of espohagus,migraines, anxiety, depression    Examination-Activity Limitations Bed Mobility;Locomotion Level;Transfers;Stand;Bend    Examination-Participation Restrictions Community Activity    Stability/Clinical Decision Making Evolving/Moderate complexity    Clinical Decision Making Moderate    Rehab Potential Good    PT Frequency 2x / week    PT Duration 4 weeks   including eval week   PT Treatment/Interventions ADLs/Self Care Home Management;Neuromuscular re-education;Balance training;Therapeutic  exercise;Therapeutic activities;Functional mobility training;Gait training;Patient/family education;Vestibular;Canalith Repostioning    PT Next Visit Plan Ask about hx of migraines:  visual symptoms or visual aura; review gaze stabilization, add to HEP:  horizontal rolling, head motions; assess DGI, mCTSIB    Consulted and Agree with Plan of Care Patient             Patient will benefit from skilled therapeutic intervention in order to improve the following deficits and impairments:  Abnormal gait, Difficulty walking, Dizziness, Decreased balance, Decreased mobility  Visit Diagnosis: Unsteadiness on feet  Dizziness and giddiness     Problem List Patient Active Problem List   Diagnosis Date Noted   BPPV (benign paroxysmal positional vertigo) 08/30/2021   Migraine 06/29/2021   Wellness examination 06/29/2021   Elevated serum creatinine 06/29/2021   Genetic testing 07/06/2018   Family history of breast cancer    Family history of colon cancer    Family history of prostate cancer    Family history of melanoma    Malignant neoplasm of lower third of esophagus ([place]) 05/16/2018   Benzodiazepine causing adverse effect in therapeutic use, sequela 02/13/2012   Bipolar affective disorder, mixed, moderate degree ([place]) 02/10/2012    Class: Acute   Panic disorder with agoraphobia, moderate agoraphobic avoidance and moderate panic attacks 02/10/2012    Catherina Pates W., PT 09/01/2021, 12:07 PM  [facility] [address],  [place], [place], [address] Phone: [phone]   Fax:  [phone]  Name: Rhonda Newton MRN: [id number] Date of Birth: 04/07/1966

## 2021-09-01 NOTE — Patient Instructions (Signed)
Access Code: [email or web address] URL: [email or web address] Date: 09/01/2021 Prepared by: [facility]  Exercises Seated Gaze Stabilization with Head Nod - 2-3 x daily - 7 x weekly - 2 sets - 10 reps Seated Gaze Stabilization with Head Rotation - 2-3 x daily - 7 x weekly - 2 sets - 10 reps

## 2021-09-02 ENCOUNTER — Encounter: Payer: Self-pay | Admitting: Physical Therapy

## 2021-09-02 ENCOUNTER — Ambulatory Visit: Payer: BC Managed Care – PPO | Admitting: Physical Therapy

## 2021-09-02 DIAGNOSIS — R42 Dizziness and giddiness: Secondary | ICD-10-CM | POA: Diagnosis not present

## 2021-09-02 DIAGNOSIS — H8110 Benign paroxysmal vertigo, unspecified ear: Secondary | ICD-10-CM | POA: Diagnosis not present

## 2021-09-02 DIAGNOSIS — R2681 Unsteadiness on feet: Secondary | ICD-10-CM | POA: Diagnosis not present

## 2021-09-02 NOTE — Therapy (Signed)
[facility] [address] [place], [place], [address] Phone: [phone]   Fax:  [phone]  Physical Therapy Treatment  Patient Details  Name: Rhonda Newton MRN: [id number] Date of Birth: 1966-02-07 Referring Provider (PT): Raymond J de [place]am, MD   Encounter Date: 09/02/2021   PT End of Session - 09/02/21 0804     Visit Number 2    Number of Visits 9    Date for PT Re-Evaluation 09/24/21    Authorization Type BCBS    PT Start Time 0801    PT Stop Time 0849    PT Time Calculation (min) 48 min    Activity Tolerance Patient tolerated treatment well    Behavior During Therapy [facility] for tasks assessed/performed             Past Medical History:  Diagnosis Date   Anxiety    Anxiety and depression    Barrett's esophagus 05/18/2018   Cataract 2002   Depression    Family history of breast cancer    Family history of colon cancer    Family history of melanoma    Family history of prostate cancer    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   Subjective Assessment - 09/02/21 0803     Subjective With movements, no problems at all or with driving.  It's when sitting still or lying down last night.  Did do the exercises.  PT asked about migraine history:migraines happen with barometric pressure changes, sometimes ocular migraines-medications alleviate; variable frequency.    Patient Stated Goals To get rid of the unsteadiness    Currently in Pain? No/denies                [facility] PT Assessment - 09/02/21 0001       Functional Gait  Assessment   Gait assessed  Yes    Gait Level Surface Walks 20 ft in less than 5.5 sec, no assistive devices, good speed, no evidence for imbalance, normal gait pattern, deviates no more than 6 in outside of the  12 in walkway width.   4.94   Change in Gait Speed Able to smoothly change walking speed without loss of balance or gait deviation. Deviate no more than 6 in outside of the 12 in walkway width.    Gait with Horizontal Head Turns Performs head turns smoothly with slight change in gait velocity (eg, minor disruption to smooth gait path), deviates 6-10 in outside 12 in walkway width, or uses an assistive device.   6.59   Gait with Vertical Head Turns Performs task with slight change in gait velocity (eg, minor disruption to smooth gait path), deviates 6 - 10 in outside 12 in walkway width or uses assistive device    Gait and Pivot Turn Pivot turns safely within 3 sec and stops quickly with no loss of balance.   feels unsteady   Step Over Obstacle Is able to step over 2 stacked shoe boxes taped together (9 in total height) without changing gait speed. No evidence of imbalance.    Gait with Narrow Base of Support Is able to ambulate for 10 steps heel to toe with no staggering.    Gait with Eyes Closed Walks 20 ft, slow speed, abnormal  gait pattern, evidence for imbalance, deviates 10-15 in outside 12 in walkway width. Requires more than 9 sec to ambulate 20 ft.   9.62   Ambulating Backwards Walks 20 ft, no assistive devices, good speed, no evidence for imbalance, normal gait    Steps Alternating feet, no rail.    Total Score 26                 Vestibular Assessment - 09/02/21 0001       Visual Acuity   Static Line 8    Dynamic Line 7                      [facility] Adult PT Treatment/Exercise - 09/02/21 0001       High Level Balance   High Level Balance Comments Standing EO solid surface 30 sec-mild sway, rates 4/10, EC solid surface 30 sec 7/10 increased sway; EO foam 30 seconds increased A-P sway with symptom rating 6/10, EC foam 30 seconds increased sway, A-P/R lateral sway.             Vestibular Treatment/Exercise - 09/02/21 0001       Vestibular Treatment/Exercise    Gaze Exercises X1 Viewing Horizontal;X1 Viewing Vertical   Reviewed HEP given last visit; educated pt to maintain steady gaze on target between sets and at end of exercise to let symptoms settle     X1 Viewing Horizontal   Foot Position seated    Reps 10    Comments increases symptoms to 4-5/10      X1 Viewing Vertical   Foot Position seated    Reps 10    Comments increases symptoms to 6-7/10                Balance Exercises - 09/02/21 0001       Balance Exercises: Standing   Wall Bumps Hip;10 reps;Eyes opened   at counter-using counter for tactile cues   Heel Raises Both;10 reps    Toe Raise Both;10 reps    Other Standing Exercises Stagger stance forward/back rock at counter, 10 reps, with 1 UE support    Other Standing Exercises Comments Used the above exercises to work on hip/ankle strategies and limits of stability.                PT Education - 09/02/21 1002     Education Details Addition to HEP-see instructions    Person(s) Educated Patient    Methods Explanation;Demonstration;Handout    Comprehension Verbalized understanding;Returned demonstration              PT Short Term Goals - 09/01/21 1202       PT SHORT TERM GOAL #1   Title = LTGs               PT Long Term Goals - 09/02/21 1008       PT LONG TERM GOAL #1   Title Pt will be independent with HEP for improved balance and functional mobility.  TARGET 09/24/2021    Time 4    Period Weeks    Status New      PT LONG TERM GOAL #2   Title Pt will report less than or equal to 2/10 dizziness with functional activities.    Baseline 5-6/10, up to 8/10 at eval    Time 4    Period Weeks    Status New      PT LONG TERM GOAL #3   Title Balance testing (DGI  vs. FGA) to be performed and goal written as appropriate.  FGA score to improve to 28/30 for decreased fall risk/improved balance.    Baseline 26/30    Time 4    Period Weeks    Status Revised      PT LONG TERM GOAL #4   Title  Modified CTSIB testing to be performing for balance systems assessment and goal to be written as appropriate.*  Pt will rate symptoms on mCTSIB testing reduced by 50% for improved balance.    Baseline Rates sway up to 7/10 during mCTSIB, especially conditions 3 and 4    Time 4    Period Weeks    Status Revised                   Plan - 09/02/21 1002     Clinical Impression Statement Assessed dynamic gait today, with FGA score os 26/30, with difficulty on gait with eyes closed and gait wtih head turns/nods, but score does not indicate increased fall risk.  With performing mCTSIB conditions, pt notes increased sway in all 4 conditions; she is able to hold 30 seconds eyes open and eyes closed on solid and compliant surfaces, but she feels and is noted to have increased trunk sway with conditions 3 and 4.  Dynamic/static visual acuity testing is WNL, at 1 line difference.  Pt demonstrates some mild LOB to R side with dynamic and static activities today, and she continues to note more symptoms upon being steady than with movement.  HEP updated today to address ankle and hip strategies for limits of stability and continued habituation exercises will help to address lessening symptoms.    Personal Factors and Comorbidities Comorbidity 3+    Comorbidities Bipolar disorder, malignant neoplasm of lower third of espohagus,migraines, anxiety, depression    Examination-Activity Limitations Bed Mobility;Locomotion Level;Transfers;Stand;Bend    Examination-Participation Restrictions Community Activity    Stability/Clinical Decision Making Evolving/Moderate complexity    Rehab Potential Good    PT Frequency 2x / week    PT Duration 4 weeks   including eval week   PT Treatment/Interventions ADLs/Self Care Home Management;Neuromuscular re-education;Balance training;Therapeutic exercise;Therapeutic activities;Functional mobility training;Gait training;Patient/family education;Vestibular;Canalith Repostioning     PT Next Visit Plan review gaze stabilization and progress as able,review additions to HEP:  horizontal rolling, head motions, turns    Consulted and Agree with Plan of Care Patient             Patient will benefit from skilled therapeutic intervention in order to improve the following deficits and impairments:  Abnormal gait, Difficulty walking, Dizziness, Decreased balance, Decreased mobility  Visit Diagnosis: Unsteadiness on feet     Problem List Patient Active Problem List   Diagnosis Date Noted   BPPV (benign paroxysmal positional vertigo) 08/30/2021   Migraine 06/29/2021   Wellness examination 06/29/2021   Elevated serum creatinine 06/29/2021   Genetic testing 07/06/2018   Family history of breast cancer    Family history of colon cancer    Family history of prostate cancer    Family history of melanoma    Malignant neoplasm of lower third of esophagus ([place]) 05/16/2018   Benzodiazepine causing adverse effect in therapeutic use, sequela 02/13/2012   Bipolar affective disorder, mixed, moderate degree ([place]) 02/10/2012    Class: Acute   Panic disorder with agoraphobia, moderate agoraphobic avoidance and moderate panic attacks 02/10/2012    Aamari Strawderman W., PT 09/02/2021, 10:09 AM  [facility] [address],  [place], [place], [address] Phone: [phone]   Fax:  [phone]  Name: Rhonda Newton MRN: [id number] Date of Birth: [birth year]

## 2021-09-02 NOTE — Patient Instructions (Signed)
Provided handout for the following to address limits of stability:  1) Heel/toe raises for ankle strategy at counter,  10 reps, 2x/day  2)"Wall bump" performed at counter,  10 reps, 2x/day

## 2021-09-08 ENCOUNTER — Encounter: Payer: Self-pay | Admitting: Physical Therapy

## 2021-09-08 ENCOUNTER — Ambulatory Visit: Payer: BC Managed Care – PPO | Admitting: Physical Therapy

## 2021-09-08 ENCOUNTER — Other Ambulatory Visit: Payer: Self-pay

## 2021-09-08 DIAGNOSIS — H8110 Benign paroxysmal vertigo, unspecified ear: Secondary | ICD-10-CM | POA: Diagnosis not present

## 2021-09-08 DIAGNOSIS — R2681 Unsteadiness on feet: Secondary | ICD-10-CM

## 2021-09-08 DIAGNOSIS — R42 Dizziness and giddiness: Secondary | ICD-10-CM

## 2021-09-08 NOTE — Therapy (Signed)
[facility] [address] [place], [place], [address] Phone: [phone]   Fax:  [phone]  Physical Therapy Treatment  Patient Details  Name: Rhonda Newton MRN: [id number] Date of Birth: Dec 30, 1965 Referring Provider (PT): Raymond J de Guam, MD   Encounter Date: 09/08/2021   PT End of Session - 09/08/21 1613     Visit Number 3    Number of Visits 9    Date for PT Re-Evaluation 09/24/21    Authorization Type BCBS    PT Start Time 1532    PT Stop Time 1610    PT Time Calculation (min) 38 min    Activity Tolerance Patient tolerated treatment well    Behavior During Therapy [facility] for tasks assessed/performed;Anxious             Past Medical History:  Diagnosis Date   Anxiety    Anxiety and depression    Barrett's esophagus 05/18/2018   Cataract 2002   Depression    Family history of breast cancer    Family history of colon cancer    Family history of melanoma    Family history of prostate cancer    GE junction carcinoma ([place]) 05/18/2018    Past Surgical History:  Procedure Laterality Date   cataracts Bilateral    age 19   INTRAUTERINE DEVICE INSERTION     Paraguard    There were no vitals filed for this visit.   Subjective Assessment - 09/08/21 1531     Subjective Still feeling about the same. Doing her HEP- the exercise with the head turns bothers her the most. Still noticing dizziness when she is still. Meclizine did not help, only made her drowsy. Around the time at first onset- she took her mother on a trip to [place] when she did a lot of driving back in October.    Patient Stated Goals To get rid of the unsteadiness    Currently in Pain? No/denies                               [facility] Adult PT Treatment/Exercise - 09/08/21 0001       Neuro Re-ed    Neuro Re-ed Details  R/L forearm prop EO, EC, EC with feet on dynadisc 3x each             Vestibular Treatment/Exercise -  09/08/21 0001       X1 Viewing Horizontal   Foot Position sitting, standing    Reps --   30" x2   Comments good gaze stability; 5-8/10 dizziness at different speeds      X1 Viewing Vertical   Foot Position standing    Reps --   30"   Comments cues to decrease speed to improve tolerance; 4-5/10 dizziness                Balance Exercises - 09/08/21 0001       Balance Exercises: Standing   Standing Eyes Closed Wide (BOA);Foam/compliant surface;2 reps;30 secs;Narrow base of support (BOS)   EC; mild sway; 2x   Sit to Stand Standard surface;Without upper extremity support;Foam/compliant surface   10x with EC and supervision               PT Education - 09/08/21 1613     Education Details update to HEP to be performed at counter top for safety-  Access Code: [email or web address]; edu on importance  of 3 systems that contribute to balance    Person(s) Educated Patient    Methods Explanation;Demonstration;Tactile cues;Verbal cues;Handout    Comprehension Verbalized understanding;Returned demonstration              PT Short Term Goals - 09/01/21 1202       PT SHORT TERM GOAL #1   Title = LTGs               PT Long Term Goals - 09/02/21 1008       PT LONG TERM GOAL #1   Title Pt will be independent with HEP for improved balance and functional mobility.  TARGET 09/24/2021    Time 4    Period Weeks    Status New      PT LONG TERM GOAL #2   Title Pt will report less than or equal to 2/10 dizziness with functional activities.    Baseline 5-6/10, up to 8/10 at eval    Time 4    Period Weeks    Status New      PT LONG TERM GOAL #3   Title Balance testing (DGI vs. FGA) to be performed and goal written as appropriate.  FGA score to improve to 28/30 for decreased fall risk/improved balance.    Baseline 26/30    Time 4    Period Weeks    Status Revised      PT LONG TERM GOAL #4   Title Modified CTSIB testing to be performing for balance systems assessment and goal  to be written as appropriate.*  Pt will rate symptoms on mCTSIB testing reduced by 50% for improved balance.    Baseline Rates sway up to 7/10 during mCTSIB, especially conditions 3 and 4    Time 4    Period Weeks    Status Revised                   Plan - 09/08/21 1614     Clinical Impression Statement Patient arrived to session with report of no changes in dizziness. Upon questioning, patient recalls doing a lot of driving to take her mother to [place] in October, around the time that symptoms first began. Progressed VOR activities to standing with cueing to decrease speed to improve tolerance for these activities. Also worked on balance activities with challenges to vision and proprioception, which patient appeared hesitant with and noted surprise at difficulty with these exercises. Patient reported understanding of HEP update and without complaints at end of session.    Personal Factors and Comorbidities Comorbidity 3+    Comorbidities Bipolar disorder, malignant neoplasm of lower third of espohagus,migraines, anxiety, depression    Examination-Activity Limitations Bed Mobility;Locomotion Level;Transfers;Stand;Bend    Examination-Participation Restrictions Community Activity    Stability/Clinical Decision Making Evolving/Moderate complexity    Rehab Potential Good    PT Frequency 2x / week    PT Duration 4 weeks   including eval week   PT Treatment/Interventions ADLs/Self Care Home Management;Neuromuscular re-education;Balance training;Therapeutic exercise;Therapeutic activities;Functional mobility training;Gait training;Patient/family education;Vestibular;Canalith Repostioning    PT Next Visit Plan review HEP; progress balance activities with senses removed;  horizontal rolling, head motions, turns    Consulted and Agree with Plan of Care Patient             Patient will benefit from skilled therapeutic intervention in order to improve the following deficits and impairments:   Abnormal gait, Difficulty walking, Dizziness, Decreased balance, Decreased mobility  Visit Diagnosis: Unsteadiness on feet  Dizziness and  giddiness     Problem List Patient Active Problem List   Diagnosis Date Noted   BPPV (benign paroxysmal positional vertigo) 08/30/2021   Migraine 06/29/2021   Wellness examination 06/29/2021   Elevated serum creatinine 06/29/2021   Genetic testing 07/06/2018   Family history of breast cancer    Family history of colon cancer    Family history of prostate cancer    Family history of melanoma    Malignant neoplasm of lower third of esophagus ([place]) 05/16/2018   Benzodiazepine causing adverse effect in therapeutic use, sequela 02/13/2012   Bipolar affective disorder, mixed, moderate degree ([place]) 02/10/2012    Class: Acute   Panic disorder with agoraphobia, moderate agoraphobic avoidance and moderate panic attacks 02/10/2012    Rhonda Newton, PT, DPT 09/08/21 4:15 PM   [facility] [address], [place], [place], [address] Phone: [phone]   Fax:  [phone]  Name: Rhonda Newton MRN: [id number] Date of Birth: 1966-05-16

## 2021-09-14 ENCOUNTER — Other Ambulatory Visit: Payer: Self-pay

## 2021-09-14 ENCOUNTER — Encounter: Payer: Self-pay | Admitting: Physical Therapy

## 2021-09-14 ENCOUNTER — Ambulatory Visit: Payer: BC Managed Care – PPO | Attending: Family Medicine | Admitting: Physical Therapy

## 2021-09-14 DIAGNOSIS — R42 Dizziness and giddiness: Secondary | ICD-10-CM | POA: Insufficient documentation

## 2021-09-14 DIAGNOSIS — R2681 Unsteadiness on feet: Secondary | ICD-10-CM | POA: Diagnosis not present

## 2021-09-14 NOTE — Therapy (Signed)
[facility] [address], [place], [place], [address] Phone: [phone]   Fax:  [phone]  Physical Therapy Treatment  Patient Details  Name: Rhonda Newton MRN: [id number] Date of Birth: Oct 07, 1965 Referring Provider (PT): Raymond J de [place], MD   Encounter Date: 09/14/2021   PT End of Session - 09/14/21 1449     Visit Number 4    Number of Visits 9    Date for PT Re-Evaluation 09/24/21    Authorization Type BCBS    PT Start Time 1402    PT Stop Time 1444    PT Time Calculation (min) 42 min    Equipment Utilized During Treatment Gait belt    Activity Tolerance Patient tolerated treatment well    Behavior During Therapy [facility] for tasks assessed/performed             Past Medical History:  Diagnosis Date   Anxiety    Anxiety and depression    Barrett's esophagus 05/18/2018   Cataract 2002   Depression    Family history of breast cancer    Family history of colon cancer    Family history of melanoma    Family history of prostate cancer    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   Subjective Assessment - 09/14/21 1401     Subjective Still feeling the same, but is doing her exercises. Notices less dizziness with movement and when watching TV.    Patient Stated Goals To get rid of the unsteadiness    Currently in Pain? No/denies                               [facility] Adult PT Treatment/Exercise - 09/14/21 0001       Neuro Re-ed    Neuro Re-ed Details  sitting on green pball + gaze on target 30", horizontal VOR on pball 30", VOR on pball + perturbations 30", sitting on pball head turns + EC + perturbations 30"; R/L forearm prop EC with feet on dynadisc 2x5 each side   reports improved symptoms when sitting on a pball      Exercises   Exercises Knee/Hip      Knee/Hip Exercises: Standing   Gait Training gait + VOR vertical and horizontal 4x76ft   mild diziness and imbalance            Vestibular Treatment/Exercise - 09/14/21 0001       Vestibular Treatment/Exercise   Habituation Exercises Standing Vertical Head Turns;Standing Horizontal Head Turns      Standing Horizontal Head Turns   Number of Reps  --   30"   Symptom Description  standing with EC      Standing Vertical Head Turns   Number of Reps  --   30"   Symptom Description  standing with EC   mild sway     X1 Viewing Horizontal   Foot Position standing    Reps --   2x30"   Comments mild c/o dizziness; mild increased difficulty with L gaze      X1 Viewing Vertical   Foot Position standing    Reps --   x30"   Comments mild c/o dizziness  Balance Exercises - 09/14/21 0001       Balance Exercises: Standing   Standing Eyes Closed Wide (BOA);Foam/compliant surface;30 secs;Narrow base of support (BOS);1 rep   mild-mod sway               PT Education - 09/14/21 1444     Education Details update to HEP-Access Code [email or web address]    Person(s) Educated Patient    Methods Explanation;Demonstration;Tactile cues;Verbal cues;Handout    Comprehension Verbalized understanding;Returned demonstration              PT Short Term Goals - 09/01/21 1202       PT SHORT TERM GOAL #1   Title = LTGs               PT Long Term Goals - 09/14/21 1452       PT LONG TERM GOAL #1   Title Pt will be independent with HEP for improved balance and functional mobility.  TARGET 09/24/2021    Time 4    Period Weeks    Status On-going      PT LONG TERM GOAL #2   Title Pt will report less than or equal to 2/10 dizziness with functional activities.    Baseline 5-6/10, up to 8/10 at eval    Time 4    Period Weeks    Status On-going      PT LONG TERM GOAL #3   Title Balance testing (DGI vs. FGA) to be performed  and goal written as appropriate.  FGA score to improve to 28/30 for decreased fall risk/improved balance.    Baseline 26/30    Time 4    Period Weeks    Status Revised      PT LONG TERM GOAL #4   Title Modified CTSIB testing to be performing for balance systems assessment and goal to be written as appropriate.*  Pt will rate symptoms on mCTSIB testing reduced by 50% for improved balance.    Baseline Rates sway up to 7/10 during mCTSIB, especially conditions 3 and 4    Time 4    Period Weeks    Status Revised                   Plan - 09/14/21 1449     Clinical Impression Statement Patient arrived to session without new complaints. Reviewed HEP for max understanding and carryover; patient was encouraged to increase speed with VOR in order to bring on mild-moderate symptoms. Some sway evident with standing exercises, particularly on foam. Patient reports improved symptoms when bouncing on a pball, however increased dizziness was reported with head turns with EC and with perturbations. Encouraged patient to use pball at work to improve tolerance for work day and updated HEP to include more challenging exercises on the pball. Improved tolerance for sitting forearm prop habituation today compared to last session. Overall patient tolerated session well and without complaints at end of session.    Personal Factors and Comorbidities Comorbidity 3+    Comorbidities Bipolar disorder, malignant neoplasm of lower third of espohagus,migraines, anxiety, depression    Examination-Activity Limitations Bed Mobility;Locomotion Level;Transfers;Stand;Bend    Examination-Participation Restrictions Community Activity    Stability/Clinical Decision Making Evolving/Moderate complexity    Rehab Potential Good    PT Frequency 2x / week    PT Duration 4 weeks   including eval week   PT Treatment/Interventions ADLs/Self Care Home Management;Neuromuscular re-education;Balance training;Therapeutic  exercise;Therapeutic activities;Functional mobility training;Gait training;Patient/family education;Vestibular;Canalith Repostioning    PT  Next Visit Plan review HEP; progress balance activities with senses removed;  horizontal rolling, head motions, turns    Consulted and Agree with Plan of Care Patient             Patient will benefit from skilled therapeutic intervention in order to improve the following deficits and impairments:  Abnormal gait, Difficulty walking, Dizziness, Decreased balance, Decreased mobility  Visit Diagnosis: Unsteadiness on feet  Dizziness and giddiness     Problem List Patient Active Problem List   Diagnosis Date Noted   BPPV (benign paroxysmal positional vertigo) 08/30/2021   Migraine 06/29/2021   Wellness examination 06/29/2021   Elevated serum creatinine 06/29/2021   Genetic testing 07/06/2018   Family history of breast cancer    Family history of colon cancer    Family history of prostate cancer    Family history of melanoma    Malignant neoplasm of lower third of esophagus ([place]) 05/16/2018   Benzodiazepine causing adverse effect in therapeutic use, sequela 02/13/2012   Bipolar affective disorder, mixed, moderate degree ([place]) 02/10/2012    Class: Acute   Panic disorder with agoraphobia, moderate agoraphobic avoidance and moderate panic attacks 02/10/2012     Janene Harvey, PT, DPT 09/14/21 4:24 PM    [facility] [address], [place], [place], [address] Phone: [phone]   Fax:  [phone]  Name: Rhonda Newton MRN: [id number] Date of Birth: 12/16/1965

## 2021-09-16 ENCOUNTER — Ambulatory Visit: Payer: BC Managed Care – PPO | Admitting: Physical Therapy

## 2021-09-21 ENCOUNTER — Ambulatory Visit: Payer: BC Managed Care – PPO | Admitting: Physical Therapy

## 2021-09-21 ENCOUNTER — Other Ambulatory Visit: Payer: Self-pay

## 2021-09-21 ENCOUNTER — Encounter: Payer: Self-pay | Admitting: Physical Therapy

## 2021-09-21 DIAGNOSIS — R2681 Unsteadiness on feet: Secondary | ICD-10-CM

## 2021-09-21 DIAGNOSIS — R42 Dizziness and giddiness: Secondary | ICD-10-CM

## 2021-09-21 NOTE — Therapy (Signed)
[facility] [address] [place], [place], [address] Phone: [phone]   Fax:  [phone]  Physical Therapy Treatment  Patient Details  Name: Rhonda Newton MRN: [id number] Date of Birth: 1966-07-11 Referring Provider (PT): Raymond J de [place], MD   Encounter Date: 09/21/2021   PT End of Session - 09/21/21 0802     Visit Number 5    Number of Visits 9    Date for PT Re-Evaluation 09/24/21    Authorization Type BCBS    PT Start Time 0803    PT Stop Time 0845    PT Time Calculation (min) 42 min    Equipment Utilized During Treatment Gait belt    Activity Tolerance Patient tolerated treatment well    Behavior During Therapy [facility] for tasks assessed/performed             Past Medical History:  Diagnosis Date   Anxiety    Anxiety and depression    Barrett's esophagus 05/18/2018   Cataract 2002   Depression    Family history of breast cancer    Family history of colon cancer    Family history of melanoma    Family history of prostate cancer    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   Subjective Assessment - 09/21/21 0804     Subjective The less I do, the worse I feel.  I didn't do much on Sunday, so I felt horrible.  Last week, probably symptoms, 5/10.  Yesterday, the symptoms made me feel in a fog, like a 6-7/10.  Have been doing the exercises and used the physioball at work.  That helped.    Patient Stated Goals To get rid of the unsteadiness    Currently in Pain? No/denies                               [facility] Adult PT Treatment/Exercise - 09/21/21 0001       Neuro Re-ed    Neuro Re-ed Details  sitting on green pball + gaze on target 30", 2 sets; hip motions on pball:  ant/posterior pelvic tilt, lateral tilts,  then hip circles.  Seated on pball wtih marching in place + head turns 5 reps, 2 sets, with gradual less motion to static position (symptoms rated as 1-2/10); seated on pball with LA! + head turns 5 reps x 2 sets wtih gardual less motion to static position (symptoms rated as 2-3/10).  Sitting on mat, R/L forearm prop EO seated on dynadisc 2x5 each side, progress to lateral hip rocking at end ranges to more static position.  Forward lean>upright posture, 2 x 10 reps, progress to ant/post hip rocking at end ranges (symptoms end at 1-2/10 with side to side; 2-3/10 with ant/posterior motions)                 Balance Exercises - 09/21/21 0001       Balance Exercises: Standing   Standing Eyes Opened Wide (BOA);Foam/compliant surface;Head turns;5 reps   UE support at counter; 2 sets   Standing Eyes Closed Wide (BOA);Foam/compliant surface;5 reps;Head turns   UE support at counter   Heel Raises Both;10 reps    Toe Raise  Both;10 reps    Other Standing Exercises On Airex, limits of stability in anterior/posterior direction, including ankle/hip strategy, with gradually lessening motion.    Other Standing Exercises Comments --                PT Education - 09/21/21 0854     Education Details Discussed pt's activity level (symptoms are less when she is in continued motion and symptoms increase when she is completely still).  Discussed gradually backing away from activity (versus going all in>no activity), taking activity/walking breaks when she is sitting and watching TV to break up symptoms; when sitting on ball at work, take breaks during the day to lessen her motion on the ball to bring on symptoms to 3-4/10 to help with habituation with less motion.    Person(s) Educated Patient    Methods Explanation;Demonstration;Verbal cues    Comprehension Verbalized understanding;Returned demonstration              PT Short Term Goals - 09/01/21 1202       PT SHORT TERM GOAL #1   Title =  LTGs               PT Long Term Goals - 09/21/21 0858       PT LONG TERM GOAL #1   Title Pt will be independent with HEP for improved balance and functional mobility.  TARGET 09/24/2021    Time 4    Period Weeks    Status Achieved      PT LONG TERM GOAL #2   Title Pt will report less than or equal to 2/10 dizziness with functional activities.    Baseline 5-6/10, up to 8/10 at eval; 09/21/2021:  when pt is still (less activity), she reports symptoms increase up to 6-7/10    Time 4    Period Weeks    Status Not Met   ongoing     PT LONG TERM GOAL #3   Title Balance testing (DGI vs. FGA) to be performed and goal written as appropriate.  FGA score to improve to 28/30 for decreased fall risk/improved balance.    Baseline 26/30    Time 4    Period Weeks    Status On-going      PT LONG TERM GOAL #4   Title Modified CTSIB testing to be performing for balance systems assessment and goal to be written as appropriate.*  Pt will rate symptoms on mCTSIB testing reduced by 50% for improved balance.    Baseline Rates sway up to 7/10 during mCTSIB, especially conditions 3 and 4    Time 4    Period Weeks    Status On-going                   Plan - 09/21/21 0900     Clinical Impression Statement Pt in session today reporting that general symptoms throughout last week were 5/10 and with decreased activity level/periods of increased rest, her symptoms increase to 6-7/10.  Treatment session today focused on seated and standing activities on compliant surfaces with x 1 viewing and with head/body motions.  With gradual transition to less motion at end of these activities, pt rates low symptoms 1-3/10, versus coming to a complete and quick stop (symptoms rated as 6-7/10).  She responds well to cues/strategies to gradually lessen the dyanmic activity in order to bring on/habituate her symptoms.  She continues to report increased feeling of sway standing on compliant surfaces and feels more  steady  with light UE support.  Began to assess goals today (pt has had to go down to 1x/wk frequency due to work, so she has not been seen as many sessions to this point as anticipated), with pt meeting LTG 1.  LTG 2 not yet met but improving.  Will plan to assess LTG 3 and 4 next visit and will need to discuss recert/continue PT to further address motion sensitivity, balance, gait unsteadiness.    Personal Factors and Comorbidities Comorbidity 3+    Comorbidities Bipolar disorder, malignant neoplasm of lower third of espohagus,migraines, anxiety, depression    Examination-Activity Limitations Bed Mobility;Locomotion Level;Transfers;Stand;Bend    Examination-Participation Restrictions Community Activity    Stability/Clinical Decision Making Evolving/Moderate complexity    Rehab Potential Good    PT Frequency 2x / week    PT Duration 4 weeks   including eval week   PT Treatment/Interventions ADLs/Self Care Home Management;Neuromuscular re-education;Balance training;Therapeutic exercise;Therapeutic activities;Functional mobility training;Gait training;Patient/family education;Vestibular;Canalith Repostioning    PT Next Visit Plan Check LTGs and will need to recert/renew or recert/discharge based on discussion with patient;  progress balance activities with senses removed;  VOR activities in standing and with gait    Consulted and Agree with Plan of Care Patient             Patient will benefit from skilled therapeutic intervention in order to improve the following deficits and impairments:  Abnormal gait, Difficulty walking, Dizziness, Decreased balance, Decreased mobility  Visit Diagnosis: Unsteadiness on feet  Dizziness and giddiness     Problem List Patient Active Problem List   Diagnosis Date Noted   BPPV (benign paroxysmal positional vertigo) 08/30/2021   Migraine 06/29/2021   Wellness examination 06/29/2021   Elevated serum creatinine 06/29/2021   Genetic testing 07/06/2018    Family history of breast cancer    Family history of colon cancer    Family history of prostate cancer    Family history of melanoma    Malignant neoplasm of lower third of esophagus ([place]) 05/16/2018   Benzodiazepine causing adverse effect in therapeutic use, sequela 02/13/2012   Bipolar affective disorder, mixed, moderate degree ([place]) 02/10/2012    Class: Acute   Panic disorder with agoraphobia, moderate agoraphobic avoidance and moderate panic attacks 02/10/2012    MARRIOTT,AMY W., PT 09/21/2021, 9:11 AM  [facility] [address], [place], [place], [address] Phone: [phone]   Fax:  [phone]  Name: Rhonda Newton MRN: [id number] Date of Birth: [birth year]

## 2021-09-22 ENCOUNTER — Ambulatory Visit (HOSPITAL_BASED_OUTPATIENT_CLINIC_OR_DEPARTMENT_OTHER): Payer: BC Managed Care – PPO | Admitting: Family Medicine

## 2021-09-22 ENCOUNTER — Encounter (HOSPITAL_BASED_OUTPATIENT_CLINIC_OR_DEPARTMENT_OTHER): Payer: Self-pay | Admitting: Family Medicine

## 2021-09-22 DIAGNOSIS — H539 Unspecified visual disturbance: Secondary | ICD-10-CM

## 2021-09-22 NOTE — Assessment & Plan Note (Addendum)
This morning, patient was getting ready for the day and was putting make-up on when she noticed sudden visual disturbance/distortion in which looked as though she was seeing improvement with large kaleidoscope.  Did not completely lose vision, just felt as though what she was seeing was distorted.  This lasted for about 1 minute and then resolved.  She did not have any other associated symptoms such as headache, chest pain, speech difficulty, motor difficulty.  She reportedly has some tingling in her feet bilaterally.  Since symptoms this morning she has not had any recurrence. She has been working with physical therapy regarding her dizziness and balance issues, their thoughts at present are not that her balance issues are related to disembarkment syndrome. On exam, cardiovascular exam with regular rate and rhythm, no murmur appreciated, no carotid bruits bilaterally.  Extraocular movements intact, pupils equal, round, reactive to light and accommodation.  Neurologically intact in bilateral upper and lower extremities, normal gait.  No slurred speech, normal thought process and content. Uncertain etiology for his symptoms, recommend further evaluation with ophthalmologist, she has been evaluated by Dr. Katy Fitch in the past, will have patient follow-up with them for further evaluation and recommendations

## 2021-09-22 NOTE — Patient Instructions (Signed)
°  Medication Instructions:  Your physician recommends that you continue on your current medications as directed. Please refer to the Current Medication list given to you today. --If you need a refill on any your medications before your next appointment, please call your pharmacy first. If no refills are authorized on file call the office.--  Follow-Up: Your next appointment:   Your physician recommends that you schedule a follow-up appointment in: as needed if symptoms worsen or change with Dr. de [place]  You will receive a text message or e-mail with a link to a survey about your care and experience with [place] today! We would greatly appreciate your feedback!   Thanks for letting us be apart of your health journey!!  Primary Care and Sports Medicine   Dr. Arlina Robes [place]   We encourage you to activate your patient portal called "MyChart".  Sign up information is provided on this After Visit Summary.  MyChart is used to connect with patients for Virtual Visits (Telemedicine).  Patients are able to view lab/test results, encounter notes, upcoming appointments, etc.  Non-urgent messages can be sent to your provider as well. To learn more about what you can do with MyChart, please visit --  [email or web address].

## 2021-09-22 NOTE — Progress Notes (Signed)
° ° °  Procedures performed today:    None.  Independent interpretation of notes and tests performed by another provider:   None.  Brief History, Exam, Impression, and Recommendations:    BP 106/72    Pulse (!) 56    Ht 5' 8.5" (1.74 m)    Wt 184 lb 8 oz (83.7 kg)    LMP 02/02/2012    SpO2 98%    BMI 27.65 kg/m   Visual disturbance This morning, patient was getting ready for the day and was putting make-up on when she noticed sudden visual disturbance/distortion in which looked as though she was seeing improvement with large kaleidoscope.  Did not completely lose vision, just felt as though what she was seeing was distorted.  This lasted for about 1 minute and then resolved.  She did not have any other associated symptoms such as headache, chest pain, speech difficulty, motor difficulty.  She reportedly has some tingling in her feet bilaterally.  Since symptoms this morning she has not had any recurrence. She has been working with physical therapy regarding her dizziness and balance issues, their thoughts at present are not that her balance issues are related to disembarkment syndrome. On exam, cardiovascular exam with regular rate and rhythm, no murmur appreciated, no carotid bruits bilaterally.  Extraocular movements intact, pupils equal, round, reactive to light and accommodation.  Neurologically intact in bilateral upper and lower extremities, normal gait.  No slurred speech, normal thought process and content. Uncertain etiology for his symptoms, recommend further evaluation with ophthalmologist, she has been evaluated by Dr. Katy Fitch in the past, will have patient follow-up with them for further evaluation and recommendations   ___________________________________________ Tyannah Sane de [place], MD, ABFM, CAQSM Primary [facility] and [place]

## 2021-09-23 ENCOUNTER — Ambulatory Visit: Payer: BC Managed Care – PPO | Admitting: Physical Therapy

## 2021-09-23 DIAGNOSIS — Z961 Presence of intraocular lens: Secondary | ICD-10-CM | POA: Diagnosis not present

## 2021-09-23 DIAGNOSIS — G43109 Migraine with aura, not intractable, without status migrainosus: Secondary | ICD-10-CM | POA: Diagnosis not present

## 2021-09-28 ENCOUNTER — Ambulatory Visit: Payer: BC Managed Care – PPO | Admitting: Physical Therapy

## 2021-09-28 ENCOUNTER — Encounter: Payer: Self-pay | Admitting: Physical Therapy

## 2021-09-28 ENCOUNTER — Other Ambulatory Visit: Payer: Self-pay

## 2021-09-28 DIAGNOSIS — R2681 Unsteadiness on feet: Secondary | ICD-10-CM

## 2021-09-28 DIAGNOSIS — R42 Dizziness and giddiness: Secondary | ICD-10-CM

## 2021-09-28 NOTE — Therapy (Signed)
[facility] [address] [place], [place], [address] Phone: [phone]   Fax:  [phone]  Physical Therapy Treatment/Recert  Patient Details  Name: Rhonda Newton MRN: [id number] Date of Birth: [birth year] Referring Provider (PT): Raymond J de [place]am, MD   Encounter Date: 09/28/2021   PT End of Session - 09/28/21 0847     Visit Number 6    Number of Visits 11    Date for PT Re-Evaluation 11/05/21    Authorization Type BCBS    PT Start Time 0804    PT Stop Time 0940    PT Time Calculation (min) 43 min    Equipment Utilized During Treatment Gait belt    Activity Tolerance Patient tolerated treatment well    Behavior During Therapy [facility] for tasks assessed/performed             Past Medical History:  Diagnosis Date   Anxiety    Anxiety and depression    Barrett's esophagus 05/18/2018   Cataract 2002   Depression    Family history of breast cancer    Family history of colon cancer    Family history of melanoma    Family history of prostate cancer    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   Subjective Assessment - 09/28/21 0807     Subjective Had an episode last week where I had significant eye disturbance-almost like a kaleidoscope, that resolved in about a minute.  Saw Dr. De [place]am and then Dr. Katy Fitch; thought it was not a CVA/TIA, just a migraine variant.  Feel better knowing that I've followed up.  What has really helped was the slowing down of movement over the past week.  That has almost helped me feel normal.  Diona Foley helps at work.  I have had more dizziness this week with rolling in bed.    Patient Stated Goals To get rid of the unsteadiness    Currently in Pain? No/denies                [facility] PT Assessment - 09/28/21 0001        Functional Gait  Assessment   Gait assessed  Yes    Gait Level Surface Walks 20 ft in less than 5.5 sec, no assistive devices, good speed, no evidence for imbalance, normal gait pattern, deviates no more than 6 in outside of the 12 in walkway width.    Change in Gait Speed Able to smoothly change walking speed without loss of balance or gait deviation. Deviate no more than 6 in outside of the 12 in walkway width.    Gait with Horizontal Head Turns Performs head turns smoothly with no change in gait. Deviates no more than 6 in outside 12 in walkway width   6.03   Gait with Vertical Head Turns Performs head turns with no change in gait. Deviates no more than 6 in outside 12 in walkway width.   5.79   Gait and Pivot Turn Pivot turns safely within 3 sec and stops quickly with no loss of balance.    Step Over Obstacle Is able to step over 2 stacked shoe boxes taped together (9 in total height) without changing gait speed. No evidence of imbalance.  Gait with Narrow Base of Support Ambulates 7-9 steps.    Gait with Eyes Closed Walks 20 ft, uses assistive device, slower speed, mild gait deviations, deviates 6-10 in outside 12 in walkway width. Ambulates 20 ft in less than 9 sec but greater than 7 sec.   8.5   Ambulating Backwards Walks 20 ft, no assistive devices, good speed, no evidence for imbalance, normal gait    Steps Alternating feet, no rail.    Total Score 28    FGA comment: Improved from 26/30 to 28/30                           [facility] Adult PT Treatment/Exercise - 09/28/21 0001       High Level Balance   High Level Balance Comments Standing EO solid surface 30 sec-rates 2/10, EC solid surface 30 sec 4-5/10 mild sway; EO foam 30 seconds slightly increased A-P sway with symptom rating 3/10, EC foam 30 seconds min-moderate , A-P sway, rates 5/10.      Self-Care   Self-Care Other Self-Care Comments    Other Self-Care Comments  Discussed progress towards goals, POC,  including pt's improvements and continued difficulties.  Discussed continuing PT for additional habituation exercises in rolling, bed mobility and standing with additional multi-sensory balance training.             Vestibular Treatment/Exercise - 09/28/21 0001       Vestibular Treatment/Exercise   Habituation Exercises Horizontal Roll;Brandt Daroff      [name]   Number of Reps  2   to R side; rates 4/10; 1 rep to L, rates 6-7/10.  Modified to sidesit with 2 pillows for the above positions and rating and one rep to sidelying R Brandt-Daroff-pt felt increased symptoms including nausea.   Symptom Description  Dizziness, nausea, more intenst to L side      Horizontal Roll   Number of Reps  2   R and L   Symptom Description  4/10 dizziness symptoms, slightly decreased with additional repetition                    PT Education - 09/28/21 0959     Education Details Progress towards goals, POC, additions to HEP, MedBridge Access Code [email or web address]    Person(s) Educated Patient    Methods Explanation;Demonstration;Handout    Comprehension Verbalized understanding;Returned demonstration              PT Short Term Goals - 09/01/21 1202       PT SHORT TERM GOAL #1   Title = LTGs               PT Long Term Goals - 09/28/21 0810       PT LONG TERM GOAL #1   Title Pt will be independent with HEP for improved balance and functional mobility.  TARGET 09/24/2021    Time 4    Period Weeks    Status Achieved      PT LONG TERM GOAL #2   Title Pt will report less than or equal to 2/10 dizziness with functional activities.    Baseline 5-6/10, up to 8/10 at eval; 09/21/2021:  when pt is still (less activity), she reports symptoms increase up to 6-7/10; 09/28/21:  rates avg of 3-4/10    Time 4    Period Weeks    Status Not Met   ongoing     PT LONG TERM GOAL #  3   Title Balance testing (DGI vs. FGA) to be performed and goal written as appropriate.  FGA score to  improve to 28/30 for decreased fall risk/improved balance.    Baseline 26/30; 28/30 09/28/2021    Time 4    Period Weeks    Status Achieved      PT LONG TERM GOAL #4   Title Modified CTSIB testing to be performing for balance systems assessment and goal to be written as appropriate.*  Pt will rate symptoms on mCTSIB testing reduced by 50% for improved balance.    Baseline Rates sway up to 7/10 during mCTSIB, especially conditions 3 and 4    Time 4    Period Weeks    Status Achieved            for Recert:   PT Short Term Goals - 09/28/21 1555       PT SHORT TERM GOAL #1   Title Pt will be independent with progression of HEP to improve stability, decrease dizziness symptoms.  TARGET 10/15/2021             PT Long Term Goals - 09/28/21 1558       PT LONG TERM GOAL #1   Title Pt will be independent with final progression of HEP and plans for continued community fitness for improved balance and functional mobility.  TARGET 11/05/2021    Time 6    Period Weeks    Status Revised      PT LONG TERM GOAL #2   Title Pt will report less than or equal to 2/10 dizziness with functional activities.    Baseline 5-6/10, up to 8/10 at eval; 09/21/2021:  when pt is still (less activity), she reports symptoms increase up to 6-7/10; 09/28/21:  rates avg of 3-4/10    Time 6    Period Weeks    Status On-going      PT LONG TERM GOAL #3   Title Pt will rate symptoms on mCTSIB testing overall less than 3/10 for improved balance.    Baseline 4-5/10 for EC conditions    Time 6    Period Weeks    Status Revised      PT LONG TERM GOAL #4   Title Pt will perform rolling and bed mobiltiy with no c/o dizziness symptoms for improved habilituation for bed mobility.    Baseline 4+/10 symptoms with rolling and sidelying>sit wtih c/o nausea    Time 6    Period Weeks    Status New                   Plan - 09/28/21 1000     Clinical Impression Statement Assessed pt's LTGs today, with pt  meeting 3 of 4 LTGs.  She has met LTG 1 for HEP, LTG 3 for improved Functional Gait Assessment, and LTG 4 for improved symtpoms, decreased sway on mCTSIB conditions.  LTG 2 not met, with overall dizziness/unsteadiness symptoms improved, but not to goal level.  Overall, pt is reporting improved symptoms, decreased dizziness and sway through the course of therapy; particularly she is using strategies from education in therapy and performing exercises at home.  She is rating symptoms about 3-4/10 daily, compared to up to 8/10 at eval.  She is reporting and demonstrating today, increased dizziness and motion sensitivity with rolling and supine>sit motions.  Added to exercises for habituation for these movement patterns.  Pt generally reports decrease of dizziness/unsteadiness/sway type symptoms with continued movement in  sitting and standing; with stopping motion, she has increase in symptoms.  In this respect, this is similar to symptoms of maldebarquement syndrome; however, today she is also demonstrating motion sensitivity with bed mobiltiy activities. She has had success with graded slowing of motions to habituate symptoms with less overall movement patterns. She will continue to benefit from skilled PT towards improved overall symptoms, improved stability and decreased sway with standing and sitting activities.    Personal Factors and Comorbidities Comorbidity 3+    Comorbidities Bipolar disorder, malignant neoplasm of lower third of espohagus,migraines, anxiety, depression    Examination-Activity Limitations Bed Mobility;Locomotion Level;Transfers;Stand;Bend    Examination-Participation Restrictions Community Activity    Stability/Clinical Decision Making Evolving/Moderate complexity    Rehab Potential Good    PT Frequency 1x / week    PT Duration 6 weeks   including recert 4/72/0721 visit   PT Treatment/Interventions ADLs/Self Care Home Management;Neuromuscular re-education;Balance training;Therapeutic  exercise;Therapeutic activities;Functional mobility training;Gait training;Patient/family education;Vestibular;Canalith Repostioning    PT Next Visit Plan Check LTGs and will need to recert/renew or recert/discharge based on discussion with patient;  progress balance activities with senses removed;  VOR activities in standing and with gait    Consulted and Agree with Plan of Care Patient             Patient will benefit from skilled therapeutic intervention in order to improve the following deficits and impairments:  Abnormal gait, Difficulty walking, Dizziness, Decreased balance, Decreased mobility  Visit Diagnosis: Dizziness and giddiness  Unsteadiness on feet     Problem List Patient Active Problem List   Diagnosis Date Noted   Visual disturbance 09/22/2021   BPPV (benign paroxysmal positional vertigo) 08/30/2021   Migraine 06/29/2021   Wellness examination 06/29/2021   Elevated serum creatinine 06/29/2021   Genetic testing 07/06/2018   Family history of breast cancer    Family history of colon cancer    Family history of prostate cancer    Family history of melanoma    Malignant neoplasm of lower third of esophagus ([place]) 05/16/2018   Benzodiazepine causing adverse effect in therapeutic use, sequela 02/13/2012   Bipolar affective disorder, mixed, moderate degree ([place]) 02/10/2012    Class: Acute   Panic disorder with agoraphobia, moderate agoraphobic avoidance and moderate panic attacks 02/10/2012    Lavone Barrientes W., PT 09/28/2021, 3:53 PM  [facility] [address], [place], [place], [address] Phone: [phone]   Fax:  [phone]  Name: Neyra Pettie MRN: [id number] Date of Birth: [birth year]

## 2021-09-28 NOTE — Patient Instructions (Addendum)
Access Code: [email or web address] URL: [email or web address] Date: 09/28/2021 Prepared by: [facility]  Exercises Standing Gaze Stabilization with Head Rotation - 1 x daily - 5 x weekly - 2 sets - 30 sec hold Standing Gaze Stabilization with Head Nod - 1 x daily - 5 x weekly - 2 sets - 30 sec hold Standing with Head Rotation - 1 x daily - 5 x weekly - 2 sets - 10 reps Standing with Head Nod - 1 x daily - 5 x weekly - 2 sets - 10 reps Standing on Foam Pad - 1 x daily - 5 x weekly - 2-3 sets - 30 sec hold Seated Swiss Ball Pelvic Circles - 1 x daily - 5 x weekly - 2 sets - 10 reps  Added 09/28/2021 Supine to Right Sidelying Vestibular Habituation - 1-2 x daily - 7 x weekly - 1 sets - 3-5 reps Supine to Left Sidelying Vestibular Habituation - 1-2 x daily - 7 x weekly - 1 sets - 3-5 reps

## 2021-09-30 ENCOUNTER — Encounter (HOSPITAL_BASED_OUTPATIENT_CLINIC_OR_DEPARTMENT_OTHER): Payer: Self-pay | Admitting: Family Medicine

## 2021-09-30 ENCOUNTER — Other Ambulatory Visit: Payer: Self-pay

## 2021-09-30 ENCOUNTER — Ambulatory Visit (INDEPENDENT_AMBULATORY_CARE_PROVIDER_SITE_OTHER): Payer: BC Managed Care – PPO | Admitting: Family Medicine

## 2021-09-30 VITALS — BP 112/70 | HR 67 | Ht 67.0 in | Wt 182.1 lb

## 2021-09-30 DIAGNOSIS — G43109 Migraine with aura, not intractable, without status migrainosus: Secondary | ICD-10-CM

## 2021-09-30 NOTE — Progress Notes (Signed)
° ° °  Procedures performed today:    None.  Independent interpretation of notes and tests performed by another provider:   None.  Brief History, Exam, Impression, and Recommendations:    BP 112/70    Pulse 67    Ht 5\' 7"  (1.702 m)    Wt 182 lb 1.6 oz (82.6 kg)    LMP 02/02/2012    SpO2 100%    BMI 28.52 kg/m   Migraine Patient had evaluation with her ophthalmologist and reports that evaluation was reassuring with no ophthalmologic concerns noted.  Reports that their impression was that her visual disturbances were related to migrainous aura She does have history of migraines, however feels that auras and migraines have become more frequent recently.  Generally she utilizes sumatriptan and NSAIDs to help with symptoms as needed She continues to have some dizziness for which she was seen in the office previously and referred to physical therapy.  Physical therapy has been going well and she feels that she has been making progress with this Given progression of migraine symptoms and episodes, will refer to neurology for further evaluation  Plan for follow-up in about 3 to 4 months to monitor progress or sooner as needed   ___________________________________________ Rhonda Deihl de [place], MD, ABFM, CAQSM [facility] [place]

## 2021-09-30 NOTE — Assessment & Plan Note (Signed)
Patient had evaluation with her ophthalmologist and reports that evaluation was reassuring with no ophthalmologic concerns noted.  Reports that their impression was that her visual disturbances were related to migrainous aura She does have history of migraines, however feels that auras and migraines have become more frequent recently.  Generally she utilizes sumatriptan and NSAIDs to help with symptoms as needed She continues to have some dizziness for which she was seen in the office previously and referred to physical therapy.  Physical therapy has been going well and she feels that she has been making progress with this Given progression of migraine symptoms and episodes, will refer to neurology for further evaluation

## 2021-09-30 NOTE — Patient Instructions (Addendum)
°  Medication Instructions:  Your physician recommends that you continue on your current medications as directed. Please refer to the Current Medication list given to you today. --If you need a refill on any your medications before your next appointment, please call your pharmacy first. If no refills are authorized on file call the office.--  Referrals/Procedures/Imaging: A referral has been placed for you to Neurology for evaluation and treatment. Someone from the scheduling department will be in contact with you in regards to coordinating your consultation. If you do not hear from any of the schedulers within 7-10 business days please give their office a call.  [facility] [address] [place], [place] [address] Phone: [phone]  Follow-Up: Your next appointment:   Your physician recommends that you schedule a follow-up appointment in: 3-4 MONTHS with Dr. de [place]am  You will receive a text message or e-mail with a link to a survey about your care and experience with [place] today! We would greatly appreciate your feedback!   Thanks for letting us be apart of your health journey!!  Primary Care and Sports Medicine   Dr. Arlina Robes [place]   We encourage you to activate your patient portal called "MyChart".  Sign up information is provided on this After Visit Summary.  MyChart is used to connect with patients for Virtual Visits (Telemedicine).  Patients are able to view lab/test results, encounter notes, upcoming appointments, etc.  Non-urgent messages can be sent to your provider as well. To learn more about what you can do with MyChart, please visit --  [email or web address].

## 2021-10-01 ENCOUNTER — Ambulatory Visit: Payer: BC Managed Care – PPO | Admitting: Physical Therapy

## 2021-10-06 ENCOUNTER — Encounter: Payer: Self-pay | Admitting: Physical Therapy

## 2021-10-06 ENCOUNTER — Ambulatory Visit: Payer: BC Managed Care – PPO | Admitting: Physical Therapy

## 2021-10-06 ENCOUNTER — Other Ambulatory Visit: Payer: Self-pay

## 2021-10-06 DIAGNOSIS — R2681 Unsteadiness on feet: Secondary | ICD-10-CM | POA: Diagnosis not present

## 2021-10-06 DIAGNOSIS — R42 Dizziness and giddiness: Secondary | ICD-10-CM

## 2021-10-06 NOTE — Therapy (Signed)
[facility] [address] [place], [place], [address] Phone: [phone]   Fax:  [phone]  Physical Therapy Treatment  Patient Details  Name: Rhonda Newton MRN: [id number] Date of Birth: Jul 03, 1966 Referring Provider (PT): Raymond J de [place], MD   Encounter Date: 10/06/2021   PT End of Session - 10/06/21 0806     Visit Number 7    Number of Visits 11    Date for PT Re-Evaluation 11/05/21    Authorization Type BCBS    PT Start Time 0806    PT Stop Time 0846    PT Time Calculation (min) 40 min    Equipment Utilized During Treatment --    Activity Tolerance Patient tolerated treatment well    Behavior During Therapy [facility] for tasks assessed/performed;Anxious   with some of the new activities            Past Medical History:  Diagnosis Date   Anxiety    Anxiety and depression    Barrett's esophagus 05/18/2018   Cataract 2002   Depression    Family history of breast cancer    Family history of colon cancer    Family history of melanoma    Family history of prostate cancer    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   Subjective Assessment - 10/06/21 0806     Subjective Saw the doctor and he is referring me to the neurologist (Dr. Billey Gosling) in February.  Had a migraine yesterday because of the change in weather.  New exercises going well for the rolling.  Sometimes it is bringing on the symptoms 3/10.    Patient Stated Goals To get rid of the unsteadiness    Currently in Pain? No/denies                     Vestibular Assessment - 10/06/21 0001       Positional Testing   Dix-Hallpike Dix-Hallpike Right;Dix-Hallpike Left    Horizontal Canal Testing Horizontal Canal Right;Horizontal Canal Left      Dix-Hallpike Right    Dix-Hallpike Right Duration <15 sec   more symptom of lightheaded, no dizziness   Dix-Hallpike Right Symptoms No nystagmus      Dix-Hallpike Left   Dix-Hallpike Left Duration no nystagmus    Dix-Hallpike Left Symptoms No nystagmus      Horizontal Canal Right   Horizontal Canal Right Duration No symptoms    Horizontal Canal Right Symptoms Normal      Horizontal Canal Left   Horizontal Canal Left Duration no symptoms    Horizontal Canal Left Symptoms Normal                      [facility] Adult PT Treatment/Exercise - 10/06/21 0001       Ambulation/Gait   Stairs Yes    Stairs Assistance 6: Modified independent (Device/Increase time)    Stair Management Technique One rail Right;Alternating pattern;Forwards   Cues to look down slightly to ensure foot placement and confidence with stair negotiation.   Gait Comments Short distance gait between activities to settle pt's symtpoms.             Vestibular Treatment/Exercise - 10/06/21 0001  Vestibular Treatment/Exercise   Habituation Exercises Horizontal Roll;Brandt Daroff      [name]   Number of Reps  2   modified position, sit>side sit, propped on pillow/wedge   Symptom Description  Dizziness, rates 2/10, settles after 1 minute ot R side;  To L side, syptoms 3-4>5/10 after 1 minute.  Settles after return to midline symptoms resolve in 1 minute.   Discussed doing this at a faster pace, which she is already doing at home.               Balance Exercises - 10/06/21 0001       Balance Exercises: Standing   Standing Eyes Opened Wide (BOA);Foam/compliant surface;Head turns;5 reps   Head nods   SLS with Vectors Solid surface;Foam/compliant surface;Intermittent upper extremity assist;Limitations    SLS with Vectors Limitations Alternating step tapst to 6" step    Step Ups Forward;6 inch;UE support 2    Other Standing Exercises Forward step downs, 10 reps, with symptoms increasing initially, then symptoms  decreased with repetition.                PT Education - 10/06/21 0847     Education Details Continued work on exercises in her current HEP; continued to reinforce strategies for reducing dizziness symtpoms    Person(s) Educated Patient    Methods Explanation;Demonstration    Comprehension Verbalized understanding;Returned demonstration              PT Short Term Goals - 09/28/21 1555       PT SHORT TERM GOAL #1   Title Pt will be independent with progression of HEP to improve stability, decrease dizziness symptoms.  TARGET 10/15/2021               PT Long Term Goals - 09/28/21 1558       PT LONG TERM GOAL #1   Title Pt will be independent with final progression of HEP and plans for continued community fitness for improved balance and functional mobility.  TARGET 11/05/2021    Time 6    Period Weeks    Status Revised      PT LONG TERM GOAL #2   Title Pt will report less than or equal to 2/10 dizziness with functional activities.    Baseline 5-6/10, up to 8/10 at eval; 09/21/2021:  when pt is still (less activity), she reports symptoms increase up to 6-7/10; 09/28/21:  rates avg of 3-4/10    Time 6    Period Weeks    Status On-going      PT LONG TERM GOAL #3   Title Pt will rate symptoms on mCTSIB testing overall less than 3/10 for improved balance.    Baseline 4-5/10 for EC conditions    Time 6    Period Weeks    Status Revised      PT LONG TERM GOAL #4   Title Pt will perform rolling and bed mobiltiy with no c/o dizziness symptoms for improved habilituation for bed mobility.    Baseline 4+/10 symptoms with rolling and sidelying>sit wtih c/o nausea    Time 6    Period Weeks    Status New                   Plan - 10/06/21 0810     Clinical Impression Statement Briefly assessed positional vertigo testing today, with negative Dix-Hallpike and horizontal roll testing.  Pt's dizziness symptoms increase with modified Brandt-Daroff positioning,  staying steady in  those positions, but with continued movement, she is not bothered.  Worked also on some dynamic standing exercises, again with pt experiencing the most symptoms when asked to stop and be stationary between exercises.  In general, with repetition, symptoms decrease, but she is visibly uncomfortable with symptoms today.  She continues to verbalize and demo understanding of strategies to continue with gentle rocking and movement to help decrease overall symptoms.    Personal Factors and Comorbidities Comorbidity 3+    Comorbidities Bipolar disorder, malignant neoplasm of lower third of espohagus,migraines, anxiety, depression    Examination-Activity Limitations Bed Mobility;Locomotion Level;Transfers;Stand;Bend    Examination-Participation Restrictions Community Activity    Stability/Clinical Decision Making Evolving/Moderate complexity    Rehab Potential Good    PT Frequency 1x / week    PT Duration 6 weeks   including recert 3/33/5456 visit   PT Treatment/Interventions ADLs/Self Care Home Management;Neuromuscular re-education;Balance training;Therapeutic exercise;Therapeutic activities;Functional mobility training;Gait training;Patient/family education;Vestibular;Canalith Repostioning    PT Next Visit Plan Continue seated and standing dynamic balance, head turns, VOR activities and work on VOR in standing and with gait.    Consulted and Agree with Plan of Care Patient             Patient will benefit from skilled therapeutic intervention in order to improve the following deficits and impairments:  Abnormal gait, Difficulty walking, Dizziness, Decreased balance, Decreased mobility  Visit Diagnosis: Dizziness and giddiness  Unsteadiness on feet     Problem List Patient Active Problem List   Diagnosis Date Noted   Visual disturbance 09/22/2021   BPPV (benign paroxysmal positional vertigo) 08/30/2021   Migraine 06/29/2021   Wellness examination 06/29/2021   Elevated  serum creatinine 06/29/2021   Genetic testing 07/06/2018   Family history of breast cancer    Family history of colon cancer    Family history of prostate cancer    Family history of melanoma    Malignant neoplasm of lower third of esophagus ([place]) 05/16/2018   Benzodiazepine causing adverse effect in therapeutic use, sequela 02/13/2012   Bipolar affective disorder, mixed, moderate degree ([place]) 02/10/2012    Class: Acute   Panic disorder with agoraphobia, moderate agoraphobic avoidance and moderate panic attacks 02/10/2012    Sasha Rueth W., PT 10/06/2021, 4:01 PM  [facility] [address] [place], [place], [address] Phone: [phone]   Fax:  [phone]  Name: Rhonda Newton MRN: [id number] Date of Birth: 1965/10/13

## 2021-10-12 ENCOUNTER — Ambulatory Visit: Payer: BC Managed Care – PPO | Admitting: Physical Therapy

## 2021-10-12 ENCOUNTER — Other Ambulatory Visit: Payer: Self-pay

## 2021-10-12 ENCOUNTER — Encounter: Payer: Self-pay | Admitting: Physical Therapy

## 2021-10-12 DIAGNOSIS — R42 Dizziness and giddiness: Secondary | ICD-10-CM

## 2021-10-12 DIAGNOSIS — R2681 Unsteadiness on feet: Secondary | ICD-10-CM

## 2021-10-12 NOTE — Therapy (Signed)
[facility] [address], [place], [place], [address] Phone: [phone]   Fax:  [phone]  Physical Therapy Treatment  Patient Details  Name: Rhonda Newton MRN: [id number] Date of Birth: December 08, 1965 Referring Provider (PT): Raymond J de [place], MD   Encounter Date: 10/12/2021   PT End of Session - 10/12/21 0805     Visit Number 8    Number of Visits 11    Date for PT Re-Evaluation 11/05/21    Authorization Type BCBS    PT Start Time 0805    PT Stop Time 0830    PT Time Calculation (min) 25 min    Activity Tolerance Patient tolerated treatment well    Behavior During Therapy [facility] for tasks assessed/performed             Past Medical History:  Diagnosis Date   Anxiety    Anxiety and depression    Barrett's esophagus 05/18/2018   Cataract 2002   Depression    Family history of breast cancer    Family history of colon cancer    Family history of melanoma    Family history of prostate cancer    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   Subjective Assessment - 10/12/21 0805     Subjective Took a while to calm down after last week's session.    Patient Stated Goals To get rid of the unsteadiness    Currently in Pain? No/denies                               [facility] Adult PT Treatment/Exercise - 10/12/21 0001       Self-Care   Self-Care Other Self-Care Comments    Other Self-Care Comments  Discussed pt's current HEP (pt able to verbalize full HEP), strategies to decrease dizziness symptoms, progress towards goals.  Discussed upcoming neurologist appt and putting PT on hold until at least after that appointment.  Pt in agreement.                 Balance Exercises - 10/12/21 0001       Balance Exercises:  Standing   Gait with Head Turns Forward;Other reps (comment);Limitations    Gait with Head Turns Limitations 20-25 ft, visual target with head turns, x 6 reps.  Pt initially with increased dizziness, with targets, dizziness decreases to 2/10.  Short distance gait with head nods, x 4 reps, cues to look at visual targets.    Other Standing Exercises Short distance gait with VOR cancellation, x 2 reps.  Pt with increased symptoms, feels discomfort; able to lessen symtpoms with standing wide BOS weightshifting and gentle marching in place.                PT Education - 10/12/21 0808     Education Details Discussed POC and possibly holding PT until after pt sees neurologist so she can continue to manage symptoms with strategies worked on in therapy    Person(s) Educated Patient    Methods Explanation    Comprehension Verbalized understanding              PT Short Term Goals - 10/12/21 [phone]  PT SHORT TERM GOAL #1   Title Pt will be independent with progression of HEP to improve stability, decrease dizziness symptoms.  TARGET 10/15/2021    Baseline verbalizes/demo understanding of HEP-10/12/2021    Status Achieved               PT Long Term Goals - 10/12/21 9381       PT LONG TERM GOAL #1   Title Pt will be independent with final progression of HEP and plans for continued community fitness for improved balance and functional mobility.  TARGET 11/05/2021    Time 6    Period Weeks    Status On-going      PT LONG TERM GOAL #2   Title Pt will report less than or equal to 2/10 dizziness with functional activities.    Baseline 5-6/10, up to 8/10 at eval; 09/21/2021:  when pt is still (less activity), she reports symptoms increase up to 6-7/10; 09/28/21:  rates avg of 3-4/10; 10/12/2021 3/10    Time 6    Period Weeks    Status On-going      PT LONG TERM GOAL #3   Title Pt will rate symptoms on mCTSIB testing overall less than 3/10 for improved balance.    Baseline 4-5/10  for EC conditions    Time 6    Period Weeks    Status On-going      PT LONG TERM GOAL #4   Title Pt will perform rolling and bed mobiltiy with no c/o dizziness symptoms for improved habilituation for bed mobility.    Baseline 4+/10 symptoms with rolling and sidelying>sit wtih c/o nausea; 10/12/2021 occasional dizziness, but has improved    Time 6    Period Weeks    Status On-going                   Plan - 10/12/21 0805     Clinical Impression Statement Pt reports independence in full HEP and added to HEP today for gait with head turns to visual targets.  New activities continue to bring on increase in symptoms, but with additional repetitions and practice, pt reports decrease in symptoms.  Pt verbalizes independence in use of strategies to decrease overall symptoms of dizziness throughout her day, with her avg symptom rating is 3/10 (continues to decrease even since 2 weeks ago).  Pt feel comortable continueing HEP and strategies at home on her own until seeing neurologist 11/02/21 and will follow-up with PT after that.  Will place pt on hold until after she sees MD and will discuss additional PT needs at that time.    Personal Factors and Comorbidities Comorbidity 3+    Comorbidities Bipolar disorder, malignant neoplasm of lower third of espohagus,migraines, anxiety, depression    Examination-Activity Limitations Bed Mobility;Locomotion Level;Transfers;Stand;Bend    Examination-Participation Restrictions Community Activity    Stability/Clinical Decision Making Evolving/Moderate complexity    Rehab Potential Good    PT Frequency 1x / week    PT Duration 6 weeks   including recert 0/17/5102 visit   PT Treatment/Interventions ADLs/Self Care Home Management;Neuromuscular re-education;Balance training;Therapeutic exercise;Therapeutic activities;Functional mobility training;Gait training;Patient/family education;Vestibular;Canalith Repostioning    PT Next Visit Plan PT on hold until pt  sees neurologist 11/02/21; pt to follow up with PT after that.    Consulted and Agree with Plan of Care Patient             Patient will benefit from skilled therapeutic intervention in order to improve the following deficits and  impairments:  Abnormal gait, Difficulty walking, Dizziness, Decreased balance, Decreased mobility  Visit Diagnosis: Dizziness and giddiness  Unsteadiness on feet     Problem List Patient Active Problem List   Diagnosis Date Noted   Visual disturbance 09/22/2021   BPPV (benign paroxysmal positional vertigo) 08/30/2021   Migraine 06/29/2021   Wellness examination 06/29/2021   Elevated serum creatinine 06/29/2021   Genetic testing 07/06/2018   Family history of breast cancer    Family history of colon cancer    Family history of prostate cancer    Family history of melanoma    Malignant neoplasm of lower third of esophagus ([place]) 05/16/2018   Benzodiazepine causing adverse effect in therapeutic use, sequela 02/13/2012   Bipolar affective disorder, mixed, moderate degree ([place]) 02/10/2012    Class: Acute   Panic disorder with agoraphobia, moderate agoraphobic avoidance and moderate panic attacks 02/10/2012    Shari Natt W., PT 10/12/2021, 8:44 AM  [facility] [address], [place], [place], [address] Phone: [phone]   Fax:  [phone]  Name: Rhonda Newton MRN: [id number] Date of Birth: [birth year]

## 2021-10-12 NOTE — Patient Instructions (Signed)
Access Code: [email or web address] URL: [email or web address] Date: 10/12/2021 Prepared by: [facility]  Exercises Standing Gaze Stabilization with Head Rotation - 1 x daily - 5 x weekly - 2 sets - 30 sec hold Standing Gaze Stabilization with Head Nod - 1 x daily - 5 x weekly - 2 sets - 30 sec hold Standing with Head Rotation - 1 x daily - 5 x weekly - 2 sets - 10 reps Standing with Head Nod - 1 x daily - 5 x weekly - 2 sets - 10 reps Standing on Foam Pad - 1 x daily - 5 x weekly - 2-3 sets - 30 sec hold Seated Swiss Ball Pelvic Circles - 1 x daily - 5 x weekly - 2 sets - 10 reps Supine to Right Sidelying Vestibular Habituation - 1-2 x daily - 7 x weekly - 1 sets - 3-5 reps Supine to Left Sidelying Vestibular Habituation - 1-2 x daily - 7 x weekly - 1 sets - 3-5 reps  Walking with Head Rotation - 1-2 x daily - 5 x weekly - 1 sets - 4 reps

## 2021-10-19 ENCOUNTER — Ambulatory Visit: Payer: BC Managed Care – PPO | Admitting: Physical Therapy

## 2021-10-26 ENCOUNTER — Ambulatory Visit: Payer: BC Managed Care – PPO | Admitting: Physical Therapy

## 2021-11-02 ENCOUNTER — Ambulatory Visit (INDEPENDENT_AMBULATORY_CARE_PROVIDER_SITE_OTHER): Payer: BC Managed Care – PPO | Admitting: Psychiatry

## 2021-11-02 ENCOUNTER — Encounter: Payer: Self-pay | Admitting: Psychiatry

## 2021-11-02 VITALS — BP 127/74 | HR 60 | Ht 68.0 in | Wt 185.0 lb

## 2021-11-02 DIAGNOSIS — R519 Headache, unspecified: Secondary | ICD-10-CM

## 2021-11-02 DIAGNOSIS — R2689 Other abnormalities of gait and mobility: Secondary | ICD-10-CM

## 2021-11-02 NOTE — Progress Notes (Signed)
Referring:  de [place], Raymond J, [place],  [place] [address]  PCP: de [place], Blondell Reveal, MD  Neurology was asked to evaluate Arliene Rosenow, a 56 year old female for a chief complaint of headaches.  Our recommendations of care will be communicated by shared medical record.    CC:  headaches  HPI:  Medical co-morbidities: esophageal carcinoma 2019 s/p resection  The patient presents for evaluation of migraines which have been present over the past 5 years but have worsened recently. She currently has 6-8 migraines per month. They are described as right frontal sharp pain with associated photophobia, phonophobia, and nausea. She will also have a visual aura which typically looks like sunspots and lasts for 45 minutes. They can last 12-14 hours at a time.  She takes Imitrex 25 mg as needed for migraines. This works most of the time for her. Sometimes she will need to take a second one. She tolerates this well without side effects.  She notes that her auras have started to increase lately. Had a recent aura which looked like prisms in both eyes and only lasted for 30 seconds or less. This was different from her typical aura and was not associated with a headache.  She also started to develop dizziness around October 2022. No clear triggers but notes she did have COVID in August 2022. Denies vertigo or lightheadedness. Feels more like she loses her balance or like she is on a boat. She went to physical therapy for dizziness and imbalance. Sitting still or turning her head quickly triggers her dizziness. For the most part she feels it is improved with movement.  Has a constant low level of dizziness all the time. She is not sure if this is worse with migraines or not.    Headache History: Onset: 5 years ago Triggers: weather changes Aura: sunspots, prism Location: right frontal, bilateral occiput Quality/Description: sharp Associated Symptoms:  Photophobia:  yes  Phonophobia: yes  Nausea: yes Worse with activity?: yes Duration of headaches: 12-14 hours if untreated, 2 hours with medication  Headache days per month: 8 Headache free days per month: 22  Current Treatment: Abortive Sumatriptan 25 mg PRN  Preventative none  Prior Therapies                                 Imitrex 25 mg PRN   Headache Risk Factors: Headache risk factors and/or co-morbidities (+) Neck Pain  LABS: CBC    Component Value Date/Time   WBC 7.9 05/14/2021 0918   WBC 6.0 09/11/2018 1310   RBC 4.76 05/14/2021 0918   RBC 4.20 09/11/2018 1310   HGB 13.7 05/14/2021 0918   HCT 42.3 05/14/2021 0918   PLT 338 05/14/2021 0918   MCV 89 05/14/2021 0918   MCH 28.8 05/14/2021 0918   MCH 29.5 09/11/2018 1310   MCHC 32.4 05/14/2021 0918   MCHC 31.2 09/11/2018 1310   RDW 13.0 05/14/2021 0918   LYMPHSABS 1.8 05/14/2021 0918   MONOABS 0.8 09/11/2018 1310   EOSABS 0.1 05/14/2021 0918   BASOSABS 0.1 05/14/2021 0918   CMP Latest Ref Rng & Units 06/29/2021 05/14/2021 09/11/2018  Glucose 70 - 99 mg/dL 88 82 92  BUN 6 - 24 mg/dL 13 16 15   Creatinine 0.57 - 1.00 mg/dL 0.87 1.06(H) 0.70  Sodium 134 - 144 mmol/L 144 140 138  Potassium 3.5 - 5.2 mmol/L 4.2 4.2 3.9  Chloride  96 - 106 mmol/L 106 101 101  CO2 20 - 29 mmol/L 22 22 25   Calcium 8.7 - 10.2 mg/dL 9.2 9.0 9.6  Total Protein 6.0 - 8.5 g/dL - 6.4 7.9  Total Bilirubin 0.0 - 1.2 mg/dL - 0.4 0.4  Alkaline Phos 44 - 121 IU/L - 106 87  AST 0 - 40 IU/L - 17 19  ALT 0 - 32 IU/L - 18 20   LDL 95  IMAGING:  MRI brain with contrast 2019: unremarkable  Imaging independently reviewed on November 02, 2021   Current Outpatient Medications on File Prior to Visit  Medication Sig Dispense Refill   SUMAtriptan (IMITREX) 25 MG tablet Take 1 tablet (25 mg total) by mouth 3 times/day as needed-between meals & bedtime. 12 tablet 11   No current facility-administered medications on file prior to visit.      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 Positive for headaches, dizziness. All other systems reviewed and negative unless stated otherwise in HPI.   Physical Exam:   Vital Signs: BP 127/74    Pulse 60    Ht 5\' 8"  (1.727 m)    Wt 185 lb (83.9 kg)    LMP 02/02/2012    BMI 28.13 kg/m  GENERAL: well appearing,in no acute distress,alert SKIN:  Color, texture, turgor normal. No rashes or lesions HEAD:  Normocephalic/atraumatic. CV:  RRR RESP: Normal respiratory effort MSK: no tenderness to palpation over occiput, neck, or shoulders  NEUROLOGICAL: Mental Status: Alert, oriented to person, place and time,Follows commands Cranial Nerves: PERRL,visual fields intact to confrontation,extraocular movements intact,facial sensation intact,no facial  droop or ptosis,hearing grossly intact,no dysarthria Motor: muscle strength 5/5 both upper and lower extremities,no drift, normal tone Reflexes: 2+ throughout Sensation: intact to light touch all 4 extremities Coordination: Finger-to- nose-finger intact bilaterally Gait: normal-based  No vertigo or nystagmus elicited on Dix-Hallpike maneuver. No skew, no nystagmus or corrective saccades on HINTS testing.  IMPRESSION: 56 year old female with a history of esophageal carcinoma 2019 s/p resection who presents for evaluation of worsening dizziness and migraines since October 2022. COVID infection can cause persistent  dizziness, so it is possible this triggered her symptoms. However, given history of cancer will order MRI brain to rule out underlying structural causes including neoplasm. Low suspicion for TIA as she describes positive visual phenomenon (prisms) and does not have a history of vascular risk factors. Discussed headache treatment options and she would prefer to avoid preventive medications at this time. Will start magnesium daily for headache and aura prevention. She is happy with her current dose of Imitrex.  PLAN: -MRI brain with contrast -Preventive: Start magnesium oxide or glycinate 500 mg daily -Rescue: Continue Imitrex 25 mg PRN  I spent a total of 45 minutes chart reviewing and counseling the patient. Headache education was done. Discussed treatment options including preventive and acute medications, natural supplements, and physical therapy. Discussed medication side effects, adverse reactions and drug interactions. Written educational materials and patient instructions outlining all of the above were given.  Follow-up: 4 months   Genia Harold, MD 11/02/2021   4:17 PM

## 2021-11-02 NOTE — Patient Instructions (Addendum)
-  Start magnesium oxide or glycinate 500 mg daily for headache and aura prevention -Continue sumatriptan 25 mg as needed for migraines -MRI brain

## 2021-11-08 ENCOUNTER — Telehealth: Payer: Self-pay | Admitting: Psychiatry

## 2021-11-08 NOTE — Telephone Encounter (Signed)
MR Brain w/wo contrast Dr. Beckey Rutter Josem Kaufmann: [id number] (exp. 11/08/21 to 01/06/22). Patient is scheduled at [facility] for 11/09/21.

## 2021-11-09 ENCOUNTER — Other Ambulatory Visit: Payer: Self-pay

## 2021-11-09 ENCOUNTER — Ambulatory Visit: Payer: BC Managed Care – PPO

## 2021-11-09 DIAGNOSIS — R519 Headache, unspecified: Secondary | ICD-10-CM

## 2021-11-09 MED ORDER — GADOBENATE DIMEGLUMINE 529 MG/ML IV SOLN
15.0000 mL | Freq: Once | INTRAVENOUS | Status: AC | PRN
  Administered 2021-11-09: 15 mL via INTRAVENOUS

## 2021-12-28 ENCOUNTER — Encounter (HOSPITAL_BASED_OUTPATIENT_CLINIC_OR_DEPARTMENT_OTHER): Payer: Self-pay | Admitting: Family Medicine

## 2021-12-28 ENCOUNTER — Ambulatory Visit (INDEPENDENT_AMBULATORY_CARE_PROVIDER_SITE_OTHER): Payer: BC Managed Care – PPO | Admitting: Family Medicine

## 2021-12-28 VITALS — BP 111/50 | HR 78 | Temp 97.7°F | Ht 68.5 in | Wt 187.7 lb

## 2021-12-28 DIAGNOSIS — G43109 Migraine with aura, not intractable, without status migrainosus: Secondary | ICD-10-CM

## 2021-12-28 DIAGNOSIS — E781 Pure hyperglyceridemia: Secondary | ICD-10-CM | POA: Diagnosis not present

## 2021-12-28 DIAGNOSIS — L6612 Frontal fibrosing alopecia: Secondary | ICD-10-CM | POA: Insufficient documentation

## 2021-12-28 DIAGNOSIS — L661 Lichen planopilaris: Secondary | ICD-10-CM | POA: Insufficient documentation

## 2021-12-28 HISTORY — DX: Pure hyperglyceridemia: E78.1

## 2021-12-28 NOTE — Assessment & Plan Note (Signed)
Has met with neurology, had MRI completed which was unremarkable ?She was started on daily magnesium as preventative, has been doing well with this and has noted significant improvement in regards to symptoms and aura.  Still gets intermittent dizziness despite improvement of her symptoms.  Follow-up with neurology is scheduled in the next few months ?

## 2021-12-28 NOTE — Progress Notes (Signed)
? ? ?  Procedures performed today:   ? ?None. ? ?Independent interpretation of notes and tests performed by another provider:  ? ?None. ? ?Brief History, Exam, Impression, and Recommendations:   ? ?BP (!) 111/50   Pulse 78   Temp 97.7 ?F (36.5 ?C)   Ht 5' 8.5" (1.74 m)   Wt 187 lb 11.2 oz (85.1 kg)   LMP 02/02/2012   SpO2 99%   BMI 28.12 kg/m?  ? ?Hypertriglyceridemia ?Elevated triglycerides on prior lipid panel.  Plan to recheck fasting lipid panel in the next 1 to 2 weeks to monitor status. ?Total cholesterol and LDL were normal on prior lipid panel ? ?Migraine ?Has met with neurology, had MRI completed which was unremarkable ?She was started on daily magnesium as preventative, has been doing well with this and has noted significant improvement in regards to symptoms and aura.  Still gets intermittent dizziness despite improvement of her symptoms.  Follow-up with neurology is scheduled in the next few months ? ?Plan for follow-up in about 6 months for CPE, will have patient complete labs a few days prior including CBC, CMP, lipid panel, TSH with reflex ? ? ?___________________________________________ ?Icelynn Onken de [place], MD, ABFM, CAQSM ?Primary Care and Sports Medicine ?[place] ?

## 2021-12-28 NOTE — Assessment & Plan Note (Signed)
Elevated triglycerides on prior lipid panel.  Plan to recheck fasting lipid panel in the next 1 to 2 weeks to monitor status. ?Total cholesterol and LDL were normal on prior lipid panel ?

## 2021-12-28 NOTE — Patient Instructions (Signed)

## 2022-01-03 ENCOUNTER — Encounter: Payer: Self-pay | Admitting: Physical Therapy

## 2022-01-03 NOTE — Therapy (Signed)
[facility] ?[address], [address] ?[place], [place], [address] ?Phone: [phone]   Fax:  [phone] ? ?Patient Details  ?Name: Rhonda Newton ?MRN: [id number] ?Date of Birth: [birth year] ?Referring Provider:  No ref. provider found ? ?Encounter Date: 01/03/2022 ? ? ?PHYSICAL THERAPY DISCHARGE SUMMARY ? ?Visits from Start of Care: 8 ? ?Current functional level related to goals / functional outcomes: ? PT Long Term Goals - 10/12/21 0828   ? ?  ? PT LONG TERM GOAL #1  ? Title Pt will be independent with final progression of HEP and plans for continued community fitness for improved balance and functional mobility.  TARGET 11/05/2021   ? Time 6   ? Period Weeks   ? Status On-going   ?  ? PT LONG TERM GOAL #2  ? Title Pt will report less than or equal to 2/10 dizziness with functional activities.   ? Baseline 5-6/10, up to 8/10 at eval; 09/21/2021:  when pt is still (less activity), she reports symptoms increase up to 6-7/10; 09/28/21:  rates avg of 3-4/10; 10/12/2021 3/10   ? Time 6   ? Period Weeks   ? Status On-going   ?  ? PT LONG TERM GOAL #3  ? Title Pt will rate symptoms on mCTSIB testing overall less than 3/10 for improved balance.   ? Baseline 4-5/10 for EC conditions   ? Time 6   ? Period Weeks   ? Status On-going   ?  ? PT LONG TERM GOAL #4  ? Title Pt will perform rolling and bed mobiltiy with no c/o dizziness symptoms for improved habilituation for bed mobility.   ? Baseline 4+/10 symptoms with rolling and sidelying>sit wtih c/o nausea; 10/12/2021 occasional dizziness, but has improved   ? Time 6   ? Period Weeks   ? Status On-going   ? ?  ?  ? ?  ? ? PT Short Term Goals - 10/12/21 0842   ? ?  ? PT SHORT TERM GOAL #1  ? Title Pt will be independent with progression of HEP to improve stability, decrease dizziness symptoms.  TARGET 10/15/2021   ? Baseline verbalizes/demo understanding of HEP-10/12/2021   ? Status Achieved   ? ?  ?  ? ?  ? ?Pt reports improved symptoms with  strategies throughout course of therapy.  She did not return after 10/12/2021 visit, as she felt she could continue HEP at home. ?  ?Remaining deficits: ?Balance, dizziness ?  ?Education / Equipment: ?Educated in [place]  ? ?Patient agrees to discharge. Patient goals were not met. Patient is being discharged due to not returning since the last visit. ? ? ?Liara Holm W., PT ?01/03/2022, 8:25 AM ? ?[place] ?[facility] ?[address], [address] ?[place], [place], [address] ?Phone: [phone]   Fax:  [phone] ?

## 2022-01-12 ENCOUNTER — Ambulatory Visit (HOSPITAL_BASED_OUTPATIENT_CLINIC_OR_DEPARTMENT_OTHER): Payer: BC Managed Care – PPO

## 2022-01-12 DIAGNOSIS — E781 Pure hyperglyceridemia: Secondary | ICD-10-CM

## 2022-01-12 DIAGNOSIS — D225 Melanocytic nevi of trunk: Secondary | ICD-10-CM | POA: Diagnosis not present

## 2022-01-12 DIAGNOSIS — Z1283 Encounter for screening for malignant neoplasm of skin: Secondary | ICD-10-CM | POA: Diagnosis not present

## 2022-01-12 DIAGNOSIS — D2239 Melanocytic nevi of other parts of face: Secondary | ICD-10-CM | POA: Diagnosis not present

## 2022-01-13 LAB — LIPID PANEL
Chol/HDL Ratio: 3.8 ratio (ref 0.0–4.4)
Cholesterol, Total: 191 mg/dL (ref 100–199)
HDL: 50 mg/dL (ref >39)
LDL Chol Calc (NIH): 114 mg/dL — ABNORMAL HIGH (ref 0–99)
Triglycerides: 151 mg/dL — ABNORMAL HIGH (ref 0–149)
VLDL Cholesterol Cal: 27 mg/dL (ref 5–40)

## 2022-01-28 ENCOUNTER — Ambulatory Visit (HOSPITAL_BASED_OUTPATIENT_CLINIC_OR_DEPARTMENT_OTHER): Payer: BC Managed Care – PPO | Admitting: Family Medicine

## 2022-02-08 DIAGNOSIS — D2239 Melanocytic nevi of other parts of face: Secondary | ICD-10-CM | POA: Diagnosis not present

## 2022-03-02 ENCOUNTER — Ambulatory Visit (INDEPENDENT_AMBULATORY_CARE_PROVIDER_SITE_OTHER): Payer: BC Managed Care – PPO | Admitting: Psychiatry

## 2022-03-02 VITALS — BP 102/68 | HR 73 | Ht 69.0 in | Wt 190.5 lb

## 2022-03-02 DIAGNOSIS — G43109 Migraine with aura, not intractable, without status migrainosus: Secondary | ICD-10-CM

## 2022-03-02 NOTE — Progress Notes (Signed)
   CC:  headaches  Follow-up Visit  Last visit: 11/02/21  Brief HPI: 57 year old female with a history of esophageal carcinoma (2019, s/p resection) who follows in clinic for migraine with aura and vertigo.  At her last visit, brain MRI was ordered. She was started on magnesium for headache and aura prevention.  Interval History: Headaches and auras have increased significantly with magnesium. She has only had 4 auras and 2-3 bad migraines in the past 4 months. Imitrex does help when she takes it early enough. Dizziness is about the same. Denies room spinning vertigo or light-headedness, has more a sensation of general imbalance.  MRI brain 11/09/21 was unremarkable.  Headache days per month: 1 Headache free days per month: 29  Current Headache Regimen: Preventative: magnesium glycinate Abortive: Imitrex 25 mg PRN   Prior Therapies                                  Imitrex 25 mg PRN Magnesium  Physical Exam:   Vital Signs: BP 102/68   Pulse 73   Ht '5\' 9"'$  (1.753 m)   Wt 190 lb 8 oz (86.4 kg)   LMP 02/02/2012   BMI 28.13 kg/m  GENERAL:  well appearing, in no acute distress, alert  SKIN:  Color, texture, turgor normal. No rashes or lesions HEAD:  Normocephalic/atraumatic. RESP: normal respiratory effort MSK:  No gross joint deformities.   NEUROLOGICAL: Mental Status: Alert, oriented to person, place and time, Follows commands, and Speech fluent and appropriate. Cranial Nerves: PERRL, face symmetric, no dysarthria, hearing grossly intact Motor: moves all extremities equally Gait: normal-based.  IMPRESSION: 56 year old female with a history of esophageal carcinoma (2019, s/p resection) who presents for follow up of migraines and dizziness. Brain MRI was normal and did not reveal a neurologic cause for dizziness. Images reviewed personally today. She has had significant improvement in her migraines and auras since starting magnesium. Imitrex continues to work well for  rescue.  PLAN: -Continue magnesium glycinate 500 mg daily for migraine and aura prevention -Continue Imitrex 25 mg PRN  Follow-up: as needed  I spent a total of 18 minutes on the date of the service. Headache education was done. Discussed treatment options including natural supplements.  Discussed medication side effects, adverse reactions and drug interactions. Written educational materials and patient instructions outlining all of the above were given.  Genia Harold, MD 03/02/22 3:00 PM

## 2022-04-05 ENCOUNTER — Inpatient Hospital Stay: Payer: BC Managed Care – PPO | Admitting: Oncology

## 2022-04-21 ENCOUNTER — Inpatient Hospital Stay: Payer: BC Managed Care – PPO | Attending: Oncology | Admitting: Oncology

## 2022-04-21 VITALS — BP 117/74 | HR 68 | Temp 98.1°F | Resp 20 | Ht 69.0 in | Wt 191.8 lb

## 2022-04-21 DIAGNOSIS — C160 Malignant neoplasm of cardia: Secondary | ICD-10-CM | POA: Diagnosis not present

## 2022-04-21 DIAGNOSIS — Z8501 Personal history of malignant neoplasm of esophagus: Secondary | ICD-10-CM | POA: Diagnosis not present

## 2022-04-21 DIAGNOSIS — Z803 Family history of malignant neoplasm of breast: Secondary | ICD-10-CM | POA: Diagnosis not present

## 2022-04-21 DIAGNOSIS — Z87891 Personal history of nicotine dependence: Secondary | ICD-10-CM | POA: Diagnosis not present

## 2022-04-21 NOTE — Progress Notes (Signed)
  [place] OFFICE PROGRESS NOTE   Diagnosis: Gastroesophageal cancer  INTERVAL HISTORY:   Mr. Silos returns as scheduled.  She feels well.  No dysphagia.  Migraine headaches have improved since she began a magnesium supplement.  No complaint.  She is scheduled to see Dr. Elenor Quinones in November.  Objective:  Vital signs in last 24 hours:  Blood pressure 117/74, pulse 68, temperature 98.1 F (36.7 C), temperature source Oral, resp. rate 20, height _0  (1.753 m), weight 191 lb 12.8 oz (87 kg), last menstrual period 02/02/2012, SpO2 100 %.    HEENT: Neck without mass Lymphatics: No cervical, supraclavicular, axillary, or inguinal nodes Resp: Lungs clear bilaterally Cardio: Regular rate and rhythm GI: No mass, no hepatosplenomegaly, nontender Vascular: No leg edema   Lab Results:  Lab Results  Component Value Date   WBC 7.9 05/14/2021   HGB 13.7 05/14/2021   HCT 42.3 05/14/2021   MCV 89 05/14/2021   PLT 338 05/14/2021   NEUTROABS 5.0 05/14/2021    CMP  Lab Results  Component Value Date   NA 144 06/29/2021   K 4.2 06/29/2021   CL 106 06/29/2021   CO2 22 06/29/2021   GLUCOSE 88 06/29/2021   BUN 13 06/29/2021   CREATININE 0.87 06/29/2021   CALCIUM 9.2 06/29/2021   PROT 6.4 05/14/2021   ALBUMIN 4.4 05/14/2021   AST 17 05/14/2021   ALT 18 05/14/2021   ALKPHOS 106 05/14/2021   BILITOT 0.4 05/14/2021   GFRNONAA >60 09/11/2018   GFRAA >60 09/11/2018     Medications: I have reviewed the patient's current medications.   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; HER2 negative CTs 05/04/2018, distal esophagus mass extending to to below the GE junction, no evidence of metastatic disease, 4 mm nonspecific right upper lobe nodule Upper EUS 05/11/2018- large fungating  ulcerating mass in the distal esophagus about 34 to 38 cm from the incisors, tumor extends to the GE junction; staged T3 N1 MX Radiation 05/28/2018-07/04/2018 Cycle 1 weekly carboplatin/Taxol 05/29/2018 Cycle 2 weekly carboplatin/Taxol 06/06/2018 Cycle 3 weekly carboplatin/Taxol 06/12/2018 Cycle 4 weekly carboplatin/Taxol 06/20/2018 Cycle 5 weekly carboplatin/Taxol 06/26/2018 PET scan 08/16/2018- decreased hypermetabolic activity and thickness of the distal esophagus consistent with treated residual neoplasm, no evidence of metastatic disease Esophagectomy and jejunostomy feeding tube 08/17/2018-complete pathologic response CTs 03/07/2019- new right middle lobe atelectasis or scarring; stable tiny right upper lobe pulmonary nodule; status post esophagogastrectomy with expected postoperative appearance; no evidence of metastatic disease in the abdomen or pelvis.  Similar right adrenal thickening. CT 04/08/2020-no evidence of recurrent disease CTs at [facility] 09/17/2020-no evidence of recurrent disease CTs at [facility] 08/12/2021-no evidence of recurrent disease  Migraine headaches Multiple family members with breast cancer History of tobacco use     Disposition: Ms. Leth remains in clinical remission from gastroesophageal cancer.  She is scheduled for a follow-up visit and surveillance imaging at [facility] in November.  She will return for an office visit in 1 year.  She will call in the interim for new symptoms.  Betsy Coder, MD  04/21/2022  12:25 PM

## 2022-06-22 ENCOUNTER — Ambulatory Visit (HOSPITAL_BASED_OUTPATIENT_CLINIC_OR_DEPARTMENT_OTHER): Payer: BC Managed Care – PPO

## 2022-06-22 DIAGNOSIS — Z0000 Encounter for general adult medical examination without abnormal findings: Secondary | ICD-10-CM | POA: Diagnosis not present

## 2022-06-23 LAB — COMPREHENSIVE METABOLIC PANEL
ALT: 14 IU/L (ref 0–32)
AST: 19 IU/L (ref 0–40)
Albumin/Globulin Ratio: 1.7 (ref 1.2–2.2)
Albumin: 4.3 g/dL (ref 3.8–4.9)
Alkaline Phosphatase: 113 IU/L (ref 44–121)
BUN/Creatinine Ratio: 14 (ref 9–23)
BUN: 13 mg/dL (ref 6–24)
Bilirubin Total: 0.7 mg/dL (ref 0.0–1.2)
CO2: 24 mmol/L (ref 20–29)
Calcium: 9.6 mg/dL (ref 8.7–10.2)
Chloride: 102 mmol/L (ref 96–106)
Creatinine, Ser: 0.95 mg/dL (ref 0.57–1.00)
Globulin, Total: 2.5 g/dL (ref 1.5–4.5)
Glucose: 90 mg/dL (ref 70–99)
Potassium: 4.4 mmol/L (ref 3.5–5.2)
Sodium: 140 mmol/L (ref 134–144)
Total Protein: 6.8 g/dL (ref 6.0–8.5)
eGFR: 71 mL/min/{1.73_m2} (ref >59)

## 2022-06-23 LAB — CBC WITH DIFFERENTIAL/PLATELET
Basophils Absolute: 0.1 10*3/uL (ref 0.0–0.2)
Basos: 1 %
EOS (ABSOLUTE): 0.1 10*3/uL (ref 0.0–0.4)
Eos: 2 %
Hematocrit: 41.2 % (ref 34.0–46.6)
Hemoglobin: 13.3 g/dL (ref 11.1–15.9)
Immature Grans (Abs): 0 10*3/uL (ref 0.0–0.1)
Immature Granulocytes: 0 %
Lymphocytes Absolute: 1.5 10*3/uL (ref 0.7–3.1)
Lymphs: 25 %
MCH: 28.2 pg (ref 26.6–33.0)
MCHC: 32.3 g/dL (ref 31.5–35.7)
MCV: 87 fL (ref 79–97)
Monocytes Absolute: 0.6 10*3/uL (ref 0.1–0.9)
Monocytes: 11 %
Neutrophils Absolute: 3.7 10*3/uL (ref 1.4–7.0)
Neutrophils: 61 %
Platelets: 311 10*3/uL (ref 150–450)
RBC: 4.72 x10E6/uL (ref 3.77–5.28)
RDW: 13.1 % (ref 11.7–15.4)
WBC: 6.1 10*3/uL (ref 3.4–10.8)

## 2022-06-23 LAB — LIPID PANEL
Chol/HDL Ratio: 3.3 ratio (ref 0.0–4.4)
Cholesterol, Total: 174 mg/dL (ref 100–199)
HDL: 52 mg/dL (ref >39)
LDL Chol Calc (NIH): 93 mg/dL (ref 0–99)
Triglycerides: 168 mg/dL — ABNORMAL HIGH (ref 0–149)
VLDL Cholesterol Cal: 29 mg/dL (ref 5–40)

## 2022-06-23 LAB — TSH: TSH: 1.05 u[IU]/mL (ref 0.450–4.500)

## 2022-06-23 LAB — HEMOGLOBIN A1C
Est. average glucose Bld gHb Est-mCnc: 117 mg/dL
Hgb A1c MFr Bld: 5.7 % — ABNORMAL HIGH (ref 4.8–5.6)

## 2022-06-29 ENCOUNTER — Encounter (HOSPITAL_BASED_OUTPATIENT_CLINIC_OR_DEPARTMENT_OTHER): Payer: Self-pay | Admitting: Family Medicine

## 2022-06-29 ENCOUNTER — Ambulatory Visit (INDEPENDENT_AMBULATORY_CARE_PROVIDER_SITE_OTHER): Payer: BC Managed Care – PPO | Admitting: Family Medicine

## 2022-06-29 VITALS — BP 117/67 | HR 66 | Temp 97.8°F | Ht 69.0 in | Wt 190.2 lb

## 2022-06-29 DIAGNOSIS — Z0000 Encounter for general adult medical examination without abnormal findings: Secondary | ICD-10-CM | POA: Diagnosis not present

## 2022-06-29 MED ORDER — SUMATRIPTAN SUCCINATE 25 MG PO TABS: 25.0000 mg | ORAL_TABLET | Freq: Two times a day (BID) | ORAL | 11 refills | Status: DC | PRN

## 2022-06-29 NOTE — Patient Instructions (Signed)
  Medication Instructions:  Your physician recommends that you continue on your current medications as directed. Please refer to the Current Medication list given to you today. --If you need a refill on any your medications before your next appointment, please call your pharmacy first. If no refills are authorized on file call the office.-- Lab Work: Your physician has recommended that you have lab work today: No If you have labs (blood work) drawn today and your tests are completely normal, you will receive your results via MyChart message OR a phone call from our staff.  Please ensure you check your voicemail in the event that you authorized detailed messages to be left on a delegated number. If you have any lab test that is abnormal or we need to change your treatment, we will call you to review the results.  Referrals/Procedures/Imaging: No  Follow-Up: Your next appointment:   Your physician recommends that you schedule a follow-up appointment in: 1 year cpe with Dr. de [place].  You will receive a text message or e-mail with a link to a survey about your care and experience with us today! We would greatly appreciate your feedback!   Thanks for letting us be apart of your health journey!!  Primary Care and Sports Medicine   Dr. Raymond de [place]   We encourage you to activate your patient portal called "MyChart".  Sign up information is provided on this After Visit Summary.  MyChart is used to connect with patients for Virtual Visits (Telemedicine).  Patients are able to view lab/test results, encounter notes, upcoming appointments, etc.  Non-urgent messages can be sent to your provider as well. To learn more about what you can do with MyChart, please visit --  [email or web address].    

## 2022-06-29 NOTE — Assessment & Plan Note (Addendum)
Routine HCM labs reviewed. HCM reviewed/discussed. Anticipatory guidance regarding healthy weight, lifestyle and choices given. Recommend healthy diet.  Recommend approximately 150 minutes/week of moderate intensity exercise Recommend regular dental and vision exams Always use seatbelt/lap and shoulder restraints Recommend using smoke alarms and checking batteries at least twice a year Recommend using sunscreen when outside Discussed colon cancer screening recommendations, options.  Patient is UTD Discussed recommendations for shingles vaccine.  Patient is UTD, received earlier this year, HM updated Discussed tetanus immunization recommendations, patient is UTD Recommend seasonal influenza vaccine, patient has received for this year, HM updated

## 2022-06-29 NOTE — Progress Notes (Signed)
Subjective:    CC: Annual Physical Exam  HPI:  Rhonda Newton is a 56 y.o. presenting for annual physical  I reviewed the past medical history, family history, social history, surgical history, and allergies today and no changes were needed.  Please see the problem list section below in epic for further details.  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   Hypertriglyceridemia 12/28/2021   Past Surgical History: Past Surgical History:  Procedure Laterality Date   cataracts Bilateral    age 73   INTRAUTERINE DEVICE INSERTION     Paraguard   Social History: Social History   Socioeconomic History   Marital status: Divorced    Spouse name: Not on file   Number of children: Not on file   Years of education: Not on file   Highest education level: Not on file  Occupational History   Not on file  Tobacco Use   Smoking status: Former    Packs/day: 1.00    Types: Cigarettes    Quit date: 05/14/2018    Years since quitting: 4.1   Smokeless tobacco: Never  Vaping Use   Vaping Use: Never used  Substance and Sexual Activity   Alcohol use: No    Comment: occasional   Drug use: Not Currently    Types: Marijuana   Sexual activity: Yes    Birth control/protection: I.U.D.  Other Topics Concern   Not on file  Social History Narrative   Not on file   Social Determinants of Health   Financial Resource Strain: Not on file  Food Insecurity: Not on file  Transportation Needs: Not on file  Physical Activity: Not on file  Stress: Not on file  Social Connections: Not on file   Family History: Family History  Problem Relation Age of Onset   Barrett's esophagus Mother     Hypertension Mother    Breast cancer Mother 57   Breast cancer Sister 51   Breast cancer Maternal Aunt 63   Barrett's esophagus Maternal Aunt    Breast cancer Paternal Aunt        dx in her 85s   Heart disease Father    Prostate cancer Maternal Uncle        dx in his mid 74s   COPD Paternal Uncle    Heart disease Maternal Grandmother    Heart disease Maternal Grandfather    Congestive Heart Failure Paternal Grandmother    Dementia Paternal Grandmother    Kidney cancer Paternal Grandfather    Melanoma Cousin        mat first cousin; dx 41s-40s   Prostate cancer Cousin        mat first cousin dx in his mid 53s   Breast cancer Other        PGMs sister   Colon cancer Other        PGMs sister   Pancreatic cancer Other        MGFs sister   Allergies: Allergies  Allergen Reactions   Benzodiazepines     Clouding of cognition and disinhibition   Medications: See med rec.  Review of Systems: No headache,  visual changes, nausea, vomiting, diarrhea, constipation, dizziness, abdominal pain, skin rash, fevers, chills, night sweats, swollen lymph nodes, weight loss, chest pain, body aches, joint swelling, muscle aches, shortness of breath, mood changes, visual or auditory hallucinations.  Objective:    BP 117/67   Pulse 66   Temp 97.8 F (36.6 C) (Oral)   Ht '5\' 9"'$  (1.753 m)   Wt 190 lb 3.2 oz (86.3 kg)   LMP 02/02/2012   SpO2 99%   BMI 28.09 kg/m   General: Well Developed, well nourished, and in no acute distress. Neuro: Alert and oriented x3, extra-ocular muscles intact, sensation grossly intact. Cranial nerves II through XII are intact, motor, sensory, and coordinative functions are all intact. HEENT: Normocephalic, atraumatic, pupils equal round reactive to light, neck supple, no masses, no lymphadenopathy, thyroid nonpalpable. Oropharynx, nasopharynx, external ear canals are unremarkable. Skin: Warm and dry, no rashes noted. Cardiac: Regular rate and rhythm, no murmurs rubs  or gallops. Respiratory: Clear to auscultation bilaterally. Not using accessory muscles, speaking in full sentences.  Abdominal: Soft, nontender, nondistended, positive bowel sounds, no masses, no organomegaly. Musculoskeletal: Shoulder, elbow, wrist, hip, knee, ankle stable, and with full range of motion.  Impression and Recommendations:    Wellness examination Routine HCM labs reviewed. HCM reviewed/discussed. Anticipatory guidance regarding healthy weight, lifestyle and choices given. Recommend healthy diet.  Recommend approximately 150 minutes/week of moderate intensity exercise Recommend regular dental and vision exams Always use seatbelt/lap and shoulder restraints Recommend using smoke alarms and checking batteries at least twice a year Recommend using sunscreen when outside Discussed colon cancer screening recommendations, options.  Patient is UTD Discussed recommendations for shingles vaccine.  Patient is UTD, received earlier this year, HM updated Discussed tetanus immunization recommendations, patient is UTD Recommend seasonal influenza vaccine, patient has received for this year, HM updated  Return in about 1 year (around 06/30/2023) for CPE.   ___________________________________________ Bryon Parker de [place], MD, ABFM, CAQSM Primary Care and [place]

## 2022-07-07 DIAGNOSIS — Z1231 Encounter for screening mammogram for malignant neoplasm of breast: Secondary | ICD-10-CM | POA: Diagnosis not present

## 2022-07-11 ENCOUNTER — Encounter (HOSPITAL_BASED_OUTPATIENT_CLINIC_OR_DEPARTMENT_OTHER): Payer: Self-pay | Admitting: Family Medicine

## 2022-07-11 DIAGNOSIS — R42 Dizziness and giddiness: Secondary | ICD-10-CM

## 2022-07-26 ENCOUNTER — Ambulatory Visit (INDEPENDENT_AMBULATORY_CARE_PROVIDER_SITE_OTHER): Payer: BC Managed Care – PPO | Admitting: Family Medicine

## 2022-07-26 ENCOUNTER — Encounter (HOSPITAL_BASED_OUTPATIENT_CLINIC_OR_DEPARTMENT_OTHER): Payer: Self-pay | Admitting: Family Medicine

## 2022-07-26 VITALS — BP 139/69 | HR 62 | Ht 69.0 in | Wt 198.0 lb

## 2022-07-26 DIAGNOSIS — R42 Dizziness and giddiness: Secondary | ICD-10-CM | POA: Insufficient documentation

## 2022-07-26 NOTE — Patient Instructions (Signed)
  Medication Instructions:  Your physician recommends that you continue on your current medications as directed. Please refer to the Current Medication list given to you today. --If you need a refill on any your medications before your next appointment, please call your pharmacy first. If no refills are authorized on file call the office.-- Lab Work: Your physician has recommended that you have lab work today: No If you have labs (blood work) drawn today and your tests are completely normal, you will receive your results via [place] a phone call from our staff.  Please ensure you check your voicemail in the event that you authorized detailed messages to be left on a delegated number. If you have any lab test that is abnormal or we need to change your treatment, we will call you to review the results.  Referrals/Procedures/Imaging: No  Follow-Up: Your next appointment:   Your physician recommends that you schedule a follow-up appointment in: 3 months with Dr. de [place].  You will receive a text message or e-mail with a link to a survey about your care and experience with [place] today! We would greatly appreciate your feedback!   Thanks for letting us be apart of your health journey!!  Primary Care and Sports Medicine   Dr. Arlina Robes [place]   We encourage you to activate your patient portal called "MyChart".  Sign up information is provided on this After Visit Summary.  MyChart is used to connect with patients for Virtual Visits (Telemedicine).  Patients are able to view lab/test results, encounter notes, upcoming appointments, etc.  Non-urgent messages can be sent to your provider as well. To learn more about what you can do with MyChart, please visit --  [email or web address].

## 2022-07-26 NOTE — Progress Notes (Signed)
    Procedures performed today:    None.  Independent interpretation of notes and tests performed by another provider:   None.  Brief History, Exam, Impression, and Recommendations:    BP 139/69 (BP Location: Right Arm, Patient Position: Sitting, Cuff Size: Large)   Pulse 62   Ht '5\' 9"'$  (1.753 m)   Wt 198 lb (89.8 kg)   LMP 02/02/2012   SpO2 100%   BMI 29.24 kg/m   Dizziness Patient reports that over the past 3 to 4 weeks, she has been noticing some dizziness.  Initially, she felt symptoms when she was working in the yard, she indicates that she was working in the garden and initially felt symptoms after a period of being bent over and then standing up.  She has had persistent dizziness over the past 3 to 4 weeks, has fluctuated in intensity.  Potential causes for worsening have been somewhat variable and not necessarily associated with positional or postural changes.  It is occurred at times when she is lying in bed without moving, has occurred at times when walking or turning her head.  She has not had any falls, however has felt that she has been somewhat unsteady on her feet.  She has not had any particular gait changes where she will lean towards 1 side or the other.  She has not been having any increase in headaches.  Current symptoms do feel somewhat different from prior issues with dizziness, particularly when she had BPPV for which she did well when working with vestibular rehab.  She does recall 1 episode over the past few weeks where she had sudden tunnel vision/decreased vision in the periphery of left thigh which was brief and lasted less than 1 minute and then resolved.  She has had prior visual aura related to history of migraines, however this felt different.  She has not had any other visual disturbances with current episode of dizziness. On exam, patient is in no acute distress, vital signs stable.  Lungs are clear to auscultation bilaterally, cardiovascular exam with regular  rate and rhythm, no murmur appreciated.  Her gait in the office is somewhat cautious and she does walk near the wall for security, however does not have obvious drifting or leaning when walking.  Pupils are equal, round and reactive to light and accommodation.  Extraocular movements are intact.  Normal sensation.  Dix-Hallpike negative. Discussed potential etiologies with patient and given differences with current symptoms related to prior clinical history and exams, feel that present symptoms are less likely to be related to BPPV as they were previously.  Given this, feel that further evaluation with cardiology and neurology would be beneficial.  She does have appointment coming up with cardiology in 1 week.  She does have an established neurologist which she will plan to contact in order to schedule follow-up visit did discuss potential concerning features/warning signs with patient today.  Did advise that if she does have future recurrence of unilateral visual changes in conjunction with her dizziness, would recommend to emergency department for evaluation.  We discussed that this could potentially be related to acute neurologic conditions such as stroke and it would be important to have this ruled out if symptoms develop  Return in about 3 months (around 10/26/2022) for dizziness.   ___________________________________________ Rhonda Mathia de [place], MD, ABFM, CAQSM Primary Care and [place]

## 2022-07-26 NOTE — Assessment & Plan Note (Signed)
Patient reports that over the past 3 to 4 weeks, she has been noticing some dizziness.  Initially, she felt symptoms when she was working in the yard, she indicates that she was working in the garden and initially felt symptoms after a period of being bent over and then standing up.  She has had persistent dizziness over the past 3 to 4 weeks, has fluctuated in intensity.  Potential causes for worsening have been somewhat variable and not necessarily associated with positional or postural changes.  It is occurred at times when she is lying in bed without moving, has occurred at times when walking or turning her head.  She has not had any falls, however has felt that she has been somewhat unsteady on her feet.  She has not had any particular gait changes where she will lean towards 1 side or the other.  She has not been having any increase in headaches.  Current symptoms do feel somewhat different from prior issues with dizziness, particularly when she had BPPV for which she did well when working with vestibular rehab.  She does recall 1 episode over the past few weeks where she had sudden tunnel vision/decreased vision in the periphery of left thigh which was brief and lasted less than 1 minute and then resolved.  She has had prior visual aura related to history of migraines, however this felt different.  She has not had any other visual disturbances with current episode of dizziness. On exam, patient is in no acute distress, vital signs stable.  Lungs are clear to auscultation bilaterally, cardiovascular exam with regular rate and rhythm, no murmur appreciated.  Her gait in the office is somewhat cautious and she does walk near the wall for security, however does not have obvious drifting or leaning when walking.  Pupils are equal, round and reactive to light and accommodation.  Extraocular movements are intact.  Normal sensation.  Dix-Hallpike negative. Discussed potential etiologies with patient and given  differences with current symptoms related to prior clinical history and exams, feel that present symptoms are less likely to be related to BPPV as they were previously.  Given this, feel that further evaluation with cardiology and neurology would be beneficial.  She does have appointment coming up with cardiology in 1 week.  She does have an established neurologist which she will plan to contact in order to schedule follow-up visit did discuss potential concerning features/warning signs with patient today.  Did advise that if she does have future recurrence of unilateral visual changes in conjunction with her dizziness, would recommend to emergency department for evaluation.  We discussed that this could potentially be related to acute neurologic conditions such as stroke and it would be important to have this ruled out if symptoms develop

## 2022-07-27 NOTE — Progress Notes (Unsigned)
CC:  headaches  Follow-up Visit  Last visit: 03/02/22  Brief HPI: 56 year old female with a history of esophageal carcinoma (2019, s/p resection) who follows in clinic for migraine with aura and vertigo. MRI brain 11/09/21 was unremarkable.  At her last visit she was continued on magnesium glycinate for migraine prevention and Imitrex 25 mg PRN for rescue.  Interval History: Last week she had an episode of vision loss in her left eye which she describes as her vision going black. She was working on a spreadsheet and leaned back, then vision went black with a small pinhole of light in the center of her vision. This lasted for less than 2 minutes before it resolved. States this was different from her typical migraine aura.  She continues to have dizziness, which she feels like has worsened in the past few weeks. Dizziness is described as a sensation that she is rolling or moving. It improves with movement. It is constant but fluctuates in severity. States she almost passes out when she bends forward and stands back up. Had another episode where she felt lightheaded after urinating. Saw her PCP who noted a negative Dix-Hallpike.   She has not had any migraine headaches, but continues to have intermittent visual auras ~2 times per week. They are described as spreading scotomas which last from 10-45 minutes at a time.   Recent LDL 93, A1c 5.7.  Current Headache Regimen: Preventative: magnesium Abortive: Imitrex 25 mg PRN   Prior Therapies                                  Imitrex 25 mg PRN Magnesium  Physical Exam:   Vital Signs: BP 129/75   Pulse 63   Ht 5' 8.5" (1.74 m)   Wt 188 lb 2 oz (85.3 kg)   LMP 02/02/2012   BMI 28.19 kg/m   Orthostatic VS for the past 24 hrs:  BP- Lying Pulse- Lying BP- Sitting Pulse- Sitting BP- Standing at 0 minutes Pulse- Standing at 0 minutes  07/28/22 0921 131/78 59 127/75 57 121/77 70     GENERAL:  well appearing, in no acute distress, alert   SKIN:  Color, texture, turgor normal. No rashes or lesions HEAD:  Normocephalic/atraumatic. RESP: normal respiratory effort MSK:  No gross joint deformities.   NEUROLOGICAL: Mental Status: Alert, oriented to person, place and time, Follows commands, and Speech fluent and appropriate. Cranial Nerves: PERRL, face symmetric, no dysarthria, hearing grossly intact Motor: moves all extremities equally Gait: normal-based.  IMPRESSION: 56 year old female with a history of esophageal carcinoma (2019, s/p resection) who presents for follow up of migraines and vertigo. Will repeat brain MRI and order CTA head/neck as she describes worsening vertigo/imbalance and a new episode of vision loss which was not typical for her migraine aura. She is not orthostatic on today's exam. She has an appointment with Cardiology next week to rule out cardiac causes of her dizziness. While is no longer having headaches, she does continue to report relatively frequent visual migraine auras. If no cause for her symptoms is found may consider starting a migraine preventive to see if this helps her dizziness.  PLAN: -MRI brain -CTA head/neck -continue magnesium 500 mg daily  Follow-up: 6 months  I spent a total of 30 minutes on the date of the service, with at least 50% of time spent face-to-face with the patient.  Genia Harold, MD  07/28/22 9:22 AM

## 2022-07-28 ENCOUNTER — Ambulatory Visit (INDEPENDENT_AMBULATORY_CARE_PROVIDER_SITE_OTHER): Payer: BC Managed Care – PPO | Admitting: Psychiatry

## 2022-07-28 VITALS — BP 129/75 | HR 63 | Ht 68.5 in | Wt 188.1 lb

## 2022-07-28 DIAGNOSIS — R42 Dizziness and giddiness: Secondary | ICD-10-CM | POA: Diagnosis not present

## 2022-07-28 DIAGNOSIS — H547 Unspecified visual loss: Secondary | ICD-10-CM

## 2022-08-01 ENCOUNTER — Telehealth: Payer: Self-pay | Admitting: Psychiatry

## 2022-08-01 NOTE — Telephone Encounter (Signed)
MRI brain-[employer] auth: [id number] exp. 08/01/22-09/29/22  CTA head/neck-[employer] auth: [id number] exp. 08/01/22-09/29/21 All sent to GI [phone]

## 2022-08-02 ENCOUNTER — Ambulatory Visit (INDEPENDENT_AMBULATORY_CARE_PROVIDER_SITE_OTHER): Payer: BC Managed Care – PPO | Admitting: Cardiology

## 2022-08-02 ENCOUNTER — Encounter (HOSPITAL_BASED_OUTPATIENT_CLINIC_OR_DEPARTMENT_OTHER): Payer: Self-pay | Admitting: Cardiology

## 2022-08-02 VITALS — BP 131/82 | HR 64 | Ht 68.5 in | Wt 187.0 lb

## 2022-08-02 DIAGNOSIS — Z7189 Other specified counseling: Secondary | ICD-10-CM

## 2022-08-02 DIAGNOSIS — Z8501 Personal history of malignant neoplasm of esophagus: Secondary | ICD-10-CM

## 2022-08-02 DIAGNOSIS — H539 Unspecified visual disturbance: Secondary | ICD-10-CM

## 2022-08-02 DIAGNOSIS — R42 Dizziness and giddiness: Secondary | ICD-10-CM | POA: Diagnosis not present

## 2022-08-02 NOTE — Progress Notes (Signed)
Cardiology Office Note:    Date:  08/03/2022   ID:  Rhonda Newton, DOB 1966-04-05, MRN [id number]  PCP:  [place], Raymond J, MD  Cardiologist:  Buford Dresser, MD  Referring MD: [place], Blondell Reveal, MD   CC: new patient consultation for dizziness  History of Present Illness:    Rhonda Newton is a 56 y.o. female with a hx of esophageal cancer, cataracts, migraines who is seen as a new consult at the request of [place], Blondell Reveal, MD for the evaluation and management of dizziness.  After having Covid last year, she has had intermittent dizziness. Helped slightly by neuro rehab. Follows with Dr. Billey Gosling, has been treated for migraine and vertigo.  Several weeks ago, was doing yardwork. Noted that when she was bending over to tie branches, was tired/exahusted. Upon standing up after bending over, felt terrible like she might pass out. Since that time, has had intermittent episodes of loss of vision and lightheadedness. Denies sensation of movement, has had vertigo in the past and this is different. In geneeral feels like her eyes cross and she might pass out. Sometimes has a "sparkle" in her vision while walking, which has been different than her other events.  Has been seen by Dr. Billey Gosling since then, scheduled for MRI brain and CT angio head/neck.  ROS positive for burning posterior left shoulder pain.   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   No current facility-administered medications on file prior to visit.     Allergies:   Benzodiazepines   Social History   Tobacco Use   Smoking status: Former    Packs/day: 1.00    Types: Cigarettes    Quit date: 05/14/2018    Years since quitting: 4.2   Smokeless tobacco: Never  Vaping Use   Vaping Use: Never used  Substance Use Topics   Alcohol use: No    Comment: occasional   Drug use: Not Currently    Types: Marijuana    Family History: family history includes Barrett's esophagus in her maternal aunt and mother; Breast cancer in her paternal aunt and another family member; Breast cancer (age of onset: 22) in her sister; Breast cancer (age of onset: 44) in her mother; Breast cancer (age of onset: 62) in her maternal aunt; COPD in her paternal uncle; Colon cancer in an other family member; Congestive Heart Failure in her paternal grandmother; Dementia in her paternal grandmother; Heart disease in her father, maternal grandfather, and maternal grandmother; Hypertension in her mother; Kidney cancer in her paternal grandfather; Melanoma in her cousin; Pancreatic cancer in an other family member; Prostate cancer in her cousin and maternal uncle.  ROS:   Please  see the history of present illness.  Additional pertinent ROS: Constitutional: Negative for chills, fever, night sweats, unintentional weight loss  HENT: Negative for ear pain and hearing loss.   Eyes: Negative for loss of vision and eye pain.  Respiratory: Negative for cough, sputum, wheezing.   Cardiovascular: See HPI. Gastrointestinal: Negative for abdominal pain, melena, and hematochezia.  Genitourinary: Negative for dysuria and hematuria.  Musculoskeletal: Negative for falls and myalgias.  Skin: Negative for itching and rash.  Neurological: Negative for focal weakness, focal  sensory changes and loss of consciousness.  Endo/Heme/Allergies: Does not bruise/bleed easily.     EKGs/Labs/Other Studies Reviewed:    The following studies were reviewed today: No prior cardiac studies  EKG:  EKG is personally reviewed.   08/02/2022: NSR 64 bpm  Recent Labs: 06/22/2022: ALT 14; BUN 13; Creatinine, Ser 0.95; Hemoglobin 13.3; Platelets 311; Potassium 4.4; Sodium 140; TSH 1.050  Recent Lipid Panel    Component Value Date/Time   CHOL 174 06/22/2022 0835   TRIG 168 (H) 06/22/2022 0835   HDL 52 06/22/2022 0835   CHOLHDL 3.3 06/22/2022 0835   LDLCALC 93 06/22/2022 0835    Physical Exam:    VS:  BP 131/82   Pulse 64   Ht 5' 8.5" (1.74 m)   Wt 187 lb (84.8 kg)   LMP 02/02/2012   SpO2 99%   BMI 28.02 kg/m     Orthostatic VS for the past 24 hrs (Last 3 readings):  BP- Lying Pulse- Lying BP- Sitting Pulse- Sitting BP- Standing at 0 minutes Pulse- Standing at 0 minutes BP- Standing at 3 minutes Pulse- Standing at 3 minutes  08/02/22 1447 120/74 62 131/82 67 131/79 79 130/78 78     Wt Readings from Last 3 Encounters:  08/02/22 187 lb (84.8 kg)  07/28/22 188 lb 2 oz (85.3 kg)  07/26/22 198 lb (89.8 kg)    GEN: Well nourished, well developed in no acute distress HEENT: Normal, moist mucous membranes NECK: No JVD CARDIAC: regular rhythm, normal S1 and S2, no rubs or gallops. No murmur. VASCULAR: Radial and DP pulses 2+ bilaterally. No carotid bruits RESPIRATORY:  Clear to auscultation without rales, wheezing or rhonchi  ABDOMEN: Soft, non-tender, non-distended MUSCULOSKELETAL:  Ambulates independently SKIN: Warm and dry, no edema NEUROLOGIC:  Alert and oriented x 3. No focal neuro deficits noted. PSYCHIATRIC:  Normal affect    ASSESSMENT:    1. Dizziness   2. Episodic lightheadedness   3. History of esophageal cancer   4. Vision changes   5. Cardiac risk counseling   6. Counseling on health promotion and disease prevention    PLAN:     Dizziness/lightheadedness Vision changes History of esophageal cancer -discussed potential etiologies at length today. She had her vagus nerve cut with surgery for her esophageal cancer, discussed that although she is not orthostatic this may play a role in her autonomic reflex -ECG normal -orthostatics negative -low suspicion that this is cardiac in etiology. Discussed that if symptoms worsen, can order echo to exclude structural/functional abnormalities. She will contact me if she develops worsening changes  Cardiac risk counseling and prevention recommendations: -recommend heart healthy/Mediterranean diet, with whole grains, fruits, vegetable, fish, lean meats, nuts, and olive oil. Limit salt. -recommend moderate walking, 3-5 times/week for 30-50 minutes each session. Aim for at least 150 minutes.week. Goal should be pace of 3 miles/hours, or walking 1.5 miles in 30 minutes -recommend avoidance of tobacco products. Avoid excess alcohol. -ASCVD risk score: The 10-year ASCVD  risk score (Arnett DK, et al., 2019) is: 1.9%   Values used to calculate the score:     Age: 18 years     Sex: Female     Is Non-Hispanic African American: No     Diabetic: No     Tobacco smoker: No     Systolic Blood Pressure: 579 mmHg     Is BP treated: No     HDL Cholesterol: 52 mg/dL     Total Cholesterol: 174 mg/dL    Plan for follow up: as needed  Buford Dresser, MD, PhD, [facility]    Medication Adjustments/Labs and Tests Ordered: Current medicines are reviewed at length with the patient today.  Concerns regarding medicines are outlined above.  No orders of the defined types were placed in this encounter.  No orders of the defined types were placed in this encounter.   Patient Instructions  Medication Instructions:  The current medical regimen is effective;  continue present plan and medications.  *If you need a refill on your cardiac medications before your next  appointment, please call your pharmacy*   Follow-Up: At [facility], you and your health needs are our priority.  As part of our continuing mission to provide you with exceptional heart care, we have created designated Provider Care Teams.  These Care Teams include your primary Cardiologist (physician) and Advanced Practice Providers (APPs -  Physician Assistants and Nurse Practitioners) who all work together to provide you with the care you need, when you need it.  We recommend signing up for the patient portal called "MyChart".  Sign up information is provided on this After Visit Summary.  MyChart is used to connect with patients for Virtual Visits (Telemedicine).  Patients are able to view lab/test results, encounter notes, upcoming appointments, etc.  Non-urgent messages can be sent to your provider as well.   To learn more about what you can do with MyChart, go to [email or web address].    Your next appointment:   As needed  The format for your next appointment:   In Person  Provider:   Buford Dresser, MD          Signed, Buford Dresser, MD PhD 08/03/2022 8:56 AM    [place]

## 2022-08-02 NOTE — Patient Instructions (Signed)
Medication Instructions:  The current medical regimen is effective;  continue present plan and medications.  *If you need a refill on your cardiac medications before your next appointment, please call your pharmacy*   Follow-Up: At [facility], you and your health needs are our priority.  As part of our continuing mission to provide you with exceptional heart care, we have created designated Provider Care Teams.  These Care Teams include your primary Cardiologist (physician) and Advanced Practice Providers (APPs -  Physician Assistants and Nurse Practitioners) who all work together to provide you with the care you need, when you need it.  We recommend signing up for the patient portal called "MyChart".  Sign up information is provided on this After Visit Summary.  MyChart is used to connect with patients for Virtual Visits (Telemedicine).  Patients are able to view lab/test results, encounter notes, upcoming appointments, etc.  Non-urgent messages can be sent to your provider as well.   To learn more about what you can do with MyChart, go to [email or web address].    Your next appointment:   As needed  The format for your next appointment:   In Person  Provider:   Buford Dresser, MD

## 2022-08-09 NOTE — Addendum Note (Signed)
Addended by: Ernie Hew D on: 08/09/2022 03:34 PM   Modules accepted: Orders

## 2022-08-10 DIAGNOSIS — Z87891 Personal history of nicotine dependence: Secondary | ICD-10-CM | POA: Diagnosis not present

## 2022-08-10 DIAGNOSIS — C155 Malignant neoplasm of lower third of esophagus: Secondary | ICD-10-CM | POA: Diagnosis not present

## 2022-08-10 DIAGNOSIS — K429 Umbilical hernia without obstruction or gangrene: Secondary | ICD-10-CM | POA: Diagnosis not present

## 2022-08-11 ENCOUNTER — Ambulatory Visit
Admission: RE | Admit: 2022-08-11 | Discharge: 2022-08-11 | Disposition: A | Discharge status: Home / Self Care | Payer: BC Managed Care – PPO | Source: Ambulatory Visit | Attending: Psychiatry | Admitting: Psychiatry

## 2022-08-11 DIAGNOSIS — R42 Dizziness and giddiness: Secondary | ICD-10-CM | POA: Diagnosis not present

## 2022-08-11 DIAGNOSIS — E041 Nontoxic single thyroid nodule: Secondary | ICD-10-CM | POA: Diagnosis not present

## 2022-08-11 DIAGNOSIS — J341 Cyst and mucocele of nose and nasal sinus: Secondary | ICD-10-CM | POA: Diagnosis not present

## 2022-08-11 DIAGNOSIS — H547 Unspecified visual loss: Secondary | ICD-10-CM | POA: Diagnosis not present

## 2022-08-11 DIAGNOSIS — R29818 Other symptoms and signs involving the nervous system: Secondary | ICD-10-CM | POA: Diagnosis not present

## 2022-08-11 MED ORDER — IOPAMIDOL (ISOVUE-370) INJECTION 76%
75.0000 mL | Freq: Once | INTRAVENOUS | Status: AC | PRN
  Administered 2022-08-11: 75 mL via INTRAVENOUS

## 2022-08-16 ENCOUNTER — Encounter: Payer: Self-pay | Admitting: Psychiatry

## 2022-08-18 ENCOUNTER — Ambulatory Visit
Admission: RE | Admit: 2022-08-18 | Discharge: 2022-08-18 | Disposition: A | Discharge status: Home / Self Care | Payer: BC Managed Care – PPO | Source: Ambulatory Visit | Attending: Psychiatry | Admitting: Psychiatry

## 2022-08-18 DIAGNOSIS — R42 Dizziness and giddiness: Secondary | ICD-10-CM

## 2022-08-18 DIAGNOSIS — H547 Unspecified visual loss: Secondary | ICD-10-CM | POA: Diagnosis not present

## 2022-08-18 MED ORDER — GADOPICLENOL 0.5 MMOL/ML IV SOLN
7.5000 mL | Freq: Once | INTRAVENOUS | Status: AC | PRN
  Administered 2022-08-18: 7.5 mL via INTRAVENOUS

## 2022-10-04 DIAGNOSIS — Z6828 Body mass index (BMI) 28.0-28.9, adult: Secondary | ICD-10-CM | POA: Diagnosis not present

## 2022-10-04 DIAGNOSIS — Z01419 Encounter for gynecological examination (general) (routine) without abnormal findings: Secondary | ICD-10-CM | POA: Diagnosis not present

## 2022-10-04 DIAGNOSIS — Z124 Encounter for screening for malignant neoplasm of cervix: Secondary | ICD-10-CM | POA: Diagnosis not present

## 2022-10-04 LAB — RESULTS CONSOLE HPV: CHL HPV: NEGATIVE

## 2022-10-04 LAB — HM PAP SMEAR: HM Pap smear: NEGATIVE

## 2022-10-06 DIAGNOSIS — Z961 Presence of intraocular lens: Secondary | ICD-10-CM | POA: Diagnosis not present

## 2022-10-06 DIAGNOSIS — H53143 Visual discomfort, bilateral: Secondary | ICD-10-CM | POA: Diagnosis not present

## 2022-10-26 ENCOUNTER — Ambulatory Visit (HOSPITAL_BASED_OUTPATIENT_CLINIC_OR_DEPARTMENT_OTHER): Payer: BC Managed Care – PPO | Admitting: Family Medicine

## 2022-10-27 ENCOUNTER — Ambulatory Visit (HOSPITAL_BASED_OUTPATIENT_CLINIC_OR_DEPARTMENT_OTHER): Payer: BC Managed Care – PPO | Admitting: Family Medicine

## 2022-10-27 ENCOUNTER — Encounter (HOSPITAL_BASED_OUTPATIENT_CLINIC_OR_DEPARTMENT_OTHER): Payer: Self-pay | Admitting: Family Medicine

## 2022-10-27 VITALS — BP 119/72 | HR 63 | Ht 68.5 in | Wt 188.0 lb

## 2022-10-27 DIAGNOSIS — E041 Nontoxic single thyroid nodule: Secondary | ICD-10-CM

## 2022-10-27 DIAGNOSIS — R42 Dizziness and giddiness: Secondary | ICD-10-CM

## 2022-10-27 HISTORY — DX: Nontoxic single thyroid nodule: E04.1

## 2022-10-27 MED ORDER — ESCITALOPRAM OXALATE 5 MG PO TABS: 5.0000 mg | ORAL_TABLET | Freq: Every day | ORAL | 1 refills | Status: DC

## 2022-10-27 NOTE — Assessment & Plan Note (Addendum)
Patient has been evaluated by cardiology and neurology without explanation of symptoms.  Reports that she has been to physical therapy in the past without resolution of symptoms.  Reports that she is seen her ophthalmologist without pathology discovered.  Reports that neurologist mentioned going on an SSRI which has shown to help with dizziness.  Lexapro 5 mg p.o. daily ordered.  She will follow-up in 3 months, sooner if needed.

## 2022-10-27 NOTE — Progress Notes (Signed)
Established Patient Office Visit  Subjective   Patient ID: Rhonda Newton, female    DOB: 01-Feb-1966  Age: 57 y.o. MRN: [id number]  Chief Complaint  Patient presents with   Follow-up    Pt here for f/u on dizzness     HPI Here to follow-up for dizziness. This is a chronic problem.  Has seen neurology and cardiology without explanation of symptoms.  Has been to PT, dizziness has never gone away. Has seen ophthalmologist without pathology found. Visual movement makes it worse.  Neurology mentioned SSRI for dizziness.   Chart review:  Incidental finding on CT scan: thyroid nodule. Ultrasound needed.  11/21: cardiolgy, low likelihood of this being heart related. 11/16: saw neuro, MRI performed, no acute findings.    Review of Systems  Constitutional:  Negative for chills and fever.  Eyes:  Negative for blurred vision and double vision.       Having aura now, wavy vision currently.   Respiratory:  Negative for shortness of breath.   Cardiovascular:  Negative for chest pain.  Gastrointestinal:  Negative for abdominal pain, nausea and vomiting.  Neurological:  Positive for dizziness and headaches (had migraine this morning, took sumatriptan and magnesium.).      Objective:     BP 119/72 (BP Location: Left Arm, Patient Position: Sitting, Cuff Size: Large)   Pulse 63   Ht 5' 8.5" (1.74 m)   Wt 188 lb (85.3 kg)   LMP 02/02/2012   SpO2 99%   BMI 28.17 kg/m  BP Readings from Last 3 Encounters:  10/27/22 119/72  08/02/22 131/82  07/28/22 129/75      Physical Exam Vitals and nursing note reviewed.  Constitutional:      General: She is not in acute distress.    Appearance: Normal appearance.  Cardiovascular:     Rate and Rhythm: Regular rhythm.     Heart sounds: Normal heart sounds.  Pulmonary:     Effort: Pulmonary effort is normal.     Breath sounds: Normal breath sounds.  Skin:    General: Skin is warm and dry.  Neurological:     General: No focal deficit  present.     Mental Status: She is alert. Mental status is at baseline.  Psychiatric:        Mood and Affect: Mood normal.        Behavior: Behavior normal.        Thought Content: Thought content normal.        Judgment: Judgment normal.     No results found for any visits on 10/27/22.    The 10-year ASCVD risk score (Arnett DK, et al., 2019) is: 1.6%    Assessment & Plan:   Problem List Items Addressed This Visit     Dizziness - Primary    Patient has been evaluated by cardiology and neurology without explanation of symptoms.  Reports that she has been to physical therapy in the past without resolution of symptoms.  Reports that she is seen her ophthalmologist without pathology discovered.  Reports that neurologist mentioned going on an SSRI which has shown to help with dizziness.  Lexapro 5 mg p.o. daily ordered.  She will follow-up in 3 months, sooner if needed.      Relevant Medications   escitalopram (LEXAPRO) 5 MG tablet   Thyroid nodule incidentally noted on imaging study    CT scan of the neck shows thyroid nodule, nonemergent ultrasound needed.  Order placed today for thyroid ultrasound.  Relevant Orders   US THYROID   Agrees with plan of care discussed.  Questions answered.   Return in about 3 months (around 01/25/2023) for dizziness and medication effectiveness .    Chalmers Guest, FNP

## 2022-10-27 NOTE — Assessment & Plan Note (Signed)
CT scan of the neck shows thyroid nodule, nonemergent ultrasound needed.  Order placed today for thyroid ultrasound.

## 2022-11-23 ENCOUNTER — Ambulatory Visit
Admission: RE | Admit: 2022-11-23 | Discharge: 2022-11-23 | Disposition: A | Discharge status: Home / Self Care | Payer: BC Managed Care – PPO | Source: Ambulatory Visit | Attending: Family Medicine | Admitting: Family Medicine

## 2022-11-23 DIAGNOSIS — E041 Nontoxic single thyroid nodule: Secondary | ICD-10-CM | POA: Diagnosis not present

## 2022-11-30 DIAGNOSIS — Z803 Family history of malignant neoplasm of breast: Secondary | ICD-10-CM | POA: Diagnosis not present

## 2022-11-30 DIAGNOSIS — Z1239 Encounter for other screening for malignant neoplasm of breast: Secondary | ICD-10-CM | POA: Diagnosis not present

## 2022-12-08 ENCOUNTER — Other Ambulatory Visit: Payer: Self-pay | Admitting: General Surgery

## 2022-12-08 DIAGNOSIS — Z803 Family history of malignant neoplasm of breast: Secondary | ICD-10-CM

## 2022-12-08 DIAGNOSIS — Z1239 Encounter for other screening for malignant neoplasm of breast: Secondary | ICD-10-CM

## 2022-12-29 ENCOUNTER — Ambulatory Visit
Admission: RE | Admit: 2022-12-29 | Discharge: 2022-12-29 | Disposition: A | Discharge status: Home / Self Care | Payer: BC Managed Care – PPO | Source: Ambulatory Visit | Attending: General Surgery | Admitting: General Surgery

## 2022-12-29 DIAGNOSIS — Z1239 Encounter for other screening for malignant neoplasm of breast: Secondary | ICD-10-CM

## 2022-12-29 DIAGNOSIS — Z803 Family history of malignant neoplasm of breast: Secondary | ICD-10-CM

## 2022-12-29 MED ORDER — GADOPICLENOL 0.5 MMOL/ML IV SOLN
8.5000 mL | Freq: Once | INTRAVENOUS | Status: AC | PRN
  Administered 2022-12-29: 8.5 mL via INTRAVENOUS

## 2023-01-25 ENCOUNTER — Ambulatory Visit (HOSPITAL_BASED_OUTPATIENT_CLINIC_OR_DEPARTMENT_OTHER): Payer: BC Managed Care – PPO | Admitting: Family Medicine

## 2023-01-25 ENCOUNTER — Encounter (HOSPITAL_BASED_OUTPATIENT_CLINIC_OR_DEPARTMENT_OTHER): Payer: Self-pay

## 2023-01-25 NOTE — Progress Notes (Signed)
Chief Complaint  Patient presents with   Follow-up    Pt in room 1, here for migraine/ dizziness follow up. Pt said migraine are doing well. Pt still has vision aura and dizziness, not severe.  Pt said magnesium helps with migraines. Last migraine last week.    HISTORY OF PRESENT ILLNESS:  01/30/23 ALL:  Rhonda Newton is a 57 y.o. female here today for follow up for migraine with aura and dizziness. She was last seen by Dr Delena Bali 07/2022 and reported an isolated event of vision loss in left eye that was not typical for previous aura symptoms. MRI and CTA head and neck unremarkable with exception of thyroid nodule.   Headaches are stable. Occasional right eye aura described as grey blobs and bilateral halo. Rx glasses have helped reduce frequency of headaches. She feels magnesium has helped as well. Sumatriptan works well for abortive therapy. She may have to take 1-2 per month on average.   Dizziness remains constant. Severity waxes and wanes. PT was helpful. Home exercises can help with dizziness worsens. She feels stress may contribute. She saw PCP who recommended escitalopram but she was hesitant of side effects. She was seen in ER last night following a dog bite and she was encouraged to increase water intake.   Cardiology evaluation was unremarkable. She had US thyroid showing 1.8cm nodule in left lobe. FNA being considered. She has follow up with PCP this week.    HISTORY (copied from Dr Quentin Mulling previous note)  Brief HPI: 57 year old female with a history of esophageal carcinoma (2019, s/p resection) who follows in clinic for migraine with aura and vertigo. MRI brain 11/09/21 was unremarkable.   At her last visit she was continued on magnesium glycinate for migraine prevention and Imitrex 25 mg PRN for rescue.   Interval History: Last week she had an episode of vision loss in her left eye which she describes as her vision going black. She was working on a spreadsheet and leaned  back, then vision went black with a small pinhole of light in the center of her vision. This lasted for less than 2 minutes before it resolved. States this was different from her typical migraine aura.   She continues to have dizziness, which she feels like has worsened in the past few weeks. Dizziness is described as a sensation that she is rolling or moving. It improves with movement. It is constant but fluctuates in severity. States she almost passes out when she bends forward and stands back up. Had another episode where she felt lightheaded after urinating. Saw her PCP who noted a negative Dix-Hallpike.    She has not had any migraine headaches, but continues to have intermittent visual auras ~2 times per week. They are described as spreading scotomas which last from 10-45 minutes at a time.    Recent LDL 93, A1c 5.7.   Current Headache Regimen: Preventative: magnesium Abortive: Imitrex 25 mg PRN     Prior Therapies                                  Imitrex 25 mg PRN Magnesium   REVIEW OF SYSTEMS: Out of a complete 14 system review of symptoms, the patient complains only of the following symptoms, headaches, aura, dizziness, anxiety and all other reviewed systems are negative.   ALLERGIES: Allergies  Allergen Reactions   Benzodiazepines     Clouding of  cognition and disinhibition     HOME MEDICATIONS: Outpatient Medications Prior to Visit  Medication Sig Dispense Refill   amoxicillin-clavulanate (AUGMENTIN) 875-125 MG tablet Take 1 tablet by mouth 2 (two) times daily. 10 tablet 0   Magnesium 500 MG TABS      SUMAtriptan (IMITREX) 25 MG tablet Take 1 tablet (25 mg total) by mouth 3 times/day as needed-between meals & bedtime. 12 tablet 11   escitalopram (LEXAPRO) 5 MG tablet Take 1 tablet (5 mg total) by mouth daily. 30 tablet 1   No facility-administered medications prior to visit.     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   Hypertriglyceridemia 12/28/2021   Thyroid nodule incidentally noted on imaging study 10/27/2022     PAST SURGICAL HISTORY: Past Surgical History:  Procedure Laterality Date   cataracts Bilateral    age 22   INTRAUTERINE DEVICE INSERTION     Paraguard     FAMILY HISTORY: Family History  Problem Relation Age of Onset   Barrett's esophagus Mother    Hypertension Mother    Breast cancer Mother 106   Breast cancer Sister 21   Breast cancer Maternal Aunt 106   Barrett's esophagus Maternal Aunt    Breast cancer Paternal Aunt        dx in her 31s   Heart disease Father    Prostate cancer Maternal Uncle        dx in his mid 38s   COPD Paternal Uncle    Heart disease Maternal Grandmother    Heart disease Maternal Grandfather    Congestive Heart Failure Paternal Grandmother    Dementia Paternal Grandmother    Kidney cancer Paternal Grandfather    Melanoma Cousin        mat first cousin; dx 59s-40s   Prostate cancer Cousin        mat first cousin dx in his mid 66s   Breast cancer Other        PGMs sister   Colon cancer Other        PGMs sister   Pancreatic cancer Other        MGFs sister     SOCIAL HISTORY: Social History   Socioeconomic History   Marital status: Divorced    Spouse name: Not on file   Number of children: Not on file   Years of education: Not on file   Highest education level: Not on file  Occupational History   Not on file  Tobacco Use   Smoking status: Former    Packs/day: 1    Types: Cigarettes    Quit date: 05/14/2018    Years since quitting: 4.7   Smokeless tobacco: Never  Vaping Use   Vaping Use: Never used  Substance and Sexual Activity   Alcohol use: No    Comment: occasional    Drug use: Not Currently    Types: Marijuana   Sexual activity: Yes    Birth control/protection: I.U.D.  Other Topics Concern   Not on file  Social History Narrative   Not on file   Social Determinants of Health   Financial Resource  Strain: Patient Declined (01/21/2023)   Overall Financial Resource Strain (CARDIA)    Difficulty of Paying Living Expenses: Patient declined  Food Insecurity: Patient Declined (01/21/2023)   Hunger Vital Sign    Worried About Running Out of Food in the Last Year: Patient declined    Ran Out of Food in the Last Year: Patient declined  Transportation Needs: Unknown (01/21/2023)   PRAPARE - Administrator, Civil Service (Medical): Patient declined    Lack of Transportation (Non-Medical): Not on file  Physical Activity: Insufficiently Active (01/21/2023)   Exercise Vital Sign    Days of Exercise per Week: 5 days    Minutes of Exercise per Session: 20 min  Stress: No Stress Concern Present (01/21/2023)   [employer] of Occupational Health - Occupational Stress Questionnaire    Feeling of Stress : Only a little  Social Connections: Unknown (01/21/2023)   Social Connection and Isolation Panel [NHANES]    Frequency of Communication with Friends and Family: Patient declined    Frequency of Social Gatherings with Friends and Family: Patient declined    Attends Religious Services: Patient declined    Database administrator or Organizations: Patient declined    Attends Banker Meetings: Not on file    Marital Status: Patient declined  Intimate Partner Violence: Not on file     PHYSICAL EXAM  Vitals:   01/30/23 0944  BP: 118/71  Pulse: 71  Weight: 186 lb 6.4 oz (84.6 kg)  Height: 5' 8.5" (1.74 m)   Body mass index is 27.93 kg/m.  Generalized: Well developed, in no acute distress  Cardiology: normal rate and rhythm, no murmur auscultated  Respiratory: clear to auscultation bilaterally    Neurological examination   Mentation: Alert oriented to time, place, history taking. Follows all commands speech and language fluent Cranial nerve II-XII: Pupils were equal round reactive to light. Extraocular movements were full, visual field were full on confrontational test. Facial sensation and strength were normal. Head turning and shoulder shrug  were normal and symmetric. Motor: The motor testing reveals 5 over 5 strength of all 4 extremities. Good symmetric motor tone is noted throughout.  Gait and station: Gait is normal.    DIAGNOSTIC DATA (LABS, IMAGING, TESTING) - I reviewed patient records, labs, notes, testing and imaging myself where available.  Lab Results  Component Value Date   WBC 6.1 06/22/2022   HGB 13.3 06/22/2022   HCT 41.2 06/22/2022   MCV 87 06/22/2022   PLT 311 06/22/2022      Component Value Date/Time   NA 140 06/22/2022 0835   K 4.4 06/22/2022 0835   CL 102 06/22/2022 0835   CO2 24 06/22/2022 0835   GLUCOSE 90 06/22/2022 0835   GLUCOSE 92 09/11/2018 1310   BUN 13 06/22/2022 0835   CREATININE 0.95 06/22/2022 0835   CREATININE 0.86 07/03/2018 0948   CALCIUM 9.6 06/22/2022 0835   PROT 6.8 06/22/2022 0835   ALBUMIN 4.3 06/22/2022 0835   AST 19 06/22/2022 0835   AST 16 07/03/2018 0948   ALT 14 06/22/2022 0835   ALT 17 07/03/2018 0948   ALKPHOS 113 06/22/2022 0835   BILITOT 0.7 06/22/2022 0835   BILITOT 0.7 07/03/2018 0948   GFRNONAA >60 09/11/2018 1310   GFRNONAA >60 07/03/2018 0948   GFRAA >60 09/11/2018 1310   GFRAA >60 07/03/2018 0948   Lab Results  Component Value Date   CHOL 174 06/22/2022   HDL 52 06/22/2022   LDLCALC 93  06/22/2022   TRIG 168 (H) 06/22/2022   CHOLHDL 3.3 06/22/2022   Lab Results  Component Value Date   HGBA1C 5.7 (H) 06/22/2022   No results found for: "VITAMINB12" Lab Results  Component Value Date   TSH 1.050 06/22/2022        No data to display               No data to display           ASSESSMENT AND PLAN  57 y.o.  year old female  has a past medical history of Anxiety, Anxiety and depression, Barrett's esophagus (05/18/2018), Cataract (2002), Depression, Family history of breast cancer, Family history of colon cancer, Family history of melanoma, Family history of prostate cancer, GE junction carcinoma (HCC) (05/18/2018), Hypertriglyceridemia (12/28/2021), and Thyroid nodule incidentally noted on imaging study (10/27/2022). here with    Migraine with aura and without status migrainosus, not intractable  Rhonda Newton reports headaches and dizziness are stable. She will continue magnesium OTC. We have reviewed possible triggers. She will continue focusing on healthy lifestyle habits. She will follow up with PCP as directed. She will return to see me in 1 year, sooner if needed. She verbalizes understanding and agreement with this plan.   No orders of the defined types were placed in this encounter.    No orders of the defined types were placed in this encounter.    Shawnie Dapper, MSN, FNP-C 01/30/2023, 10:31 AM  [facility] [address] [place], [place] [address] [phone]

## 2023-01-25 NOTE — Patient Instructions (Signed)
Below is our plan:  We will continue to monitor. Continue magnesium. Let me know if you have any worsening or new symptoms.   Please make sure you are staying well hydrated. I recommend 50-60 ounces daily. Well balanced diet and regular exercise encouraged. Consistent sleep schedule with 6-8 hours recommended.   Please continue follow up with care team as directed.   Follow up with me in 1 year   You may receive a survey regarding today's visit. I encourage you to leave honest feed back as I do use this information to improve patient care. Thank you for seeing me today!   GENERAL HEADACHE INFORMATION:   Natural supplements: Magnesium Oxide or Magnesium Glycinate 500 mg at bed (up to 800 mg daily) Coenzyme Q10 300 mg in AM Vitamin B2- 200 mg twice a day   Add 1 supplement at a time since even natural supplements can have undesirable side effects. You can sometimes buy supplements cheaper (especially Coenzyme Q10) at www.WebmailGuide.co.za or at [facility].  Migraine with aura: There is increased risk for stroke in women with migraine with aura and a contraindication for the combined contraceptive pill for use by women who have migraine with aura. The risk for women with migraine without aura is lower. However other risk factors like smoking are far more likely to increase stroke risk than migraine. There is a recommendation for no smoking and for the use of OCPs without estrogen such as progestogen only pills particularly for women with migraine with aura.Marland Kitchen People who have migraine headaches with auras may be 3 times more likely to have a stroke caused by a blood clot, compared to migraine patients who don't see auras. Women who take hormone-replacement therapy may be 30 percent more likely to suffer a clot-based stroke than women not taking medication containing estrogen. Other risk factors like smoking and high blood pressure may be  much more important.    Vitamins and herbs that show potential:    Magnesium: Magnesium (250 mg twice a day or 500 mg at bed) has a relaxant effect on smooth muscles such as blood vessels. Individuals suffering from frequent or daily headache usually have low magnesium levels which can be increase with daily supplementation of 400-750 mg. Three trials found 40-90% average headache reduction  when used as a preventative. Magnesium may help with headaches are aura, the best evidence for magnesium is for migraine with aura is its thought to stop the cortical spreading depression we believe is the pathophysiology of migraine aura.Magnesium also demonstrated the benefit in menstrually related migraine.  Magnesium is part of the messenger system in the serotonin cascade and it is a good muscle relaxant.  It is also useful for constipation which can be a side effect of other medications used to treat migraine. Good sources include nuts, whole grains, and tomatoes. Side Effects: loose stool/diarrhea  Riboflavin (vitamin B 2) 200 mg twice a day. This vitamin assists nerve cells in the production of ATP a principal energy storing molecule.  It is necessary for many chemical reactions in the body.  There have been at least 3 clinical trials of riboflavin using 400 mg per day all of which suggested that migraine frequency can be decreased.  All 3 trials showed significant improvement in over half of migraine sufferers.  The supplement is found in bread, cereal, milk, meat, and poultry.  Most Americans get more riboflavin than the recommended daily allowance, however riboflavin deficiency is not necessary for the supplements to help prevent  headache. Side effects: energizing, green urine   Coenzyme Q10: This is present in almost all cells in the body and is critical component for the conversion of energy.  Recent studies have shown that a nutritional supplement of CoQ10 can reduce the frequency of migraine attacks by improving the energy production of cells as with riboflavin.  Doses of  150 mg twice a day have been shown to be effective.   Melatonin: Increasing evidence shows correlation between melatonin secretion and headache conditions.  Melatonin supplementation has decreased headache intensity and duration.  It is widely used as a sleep aid.  Sleep is natures way of dealing with migraine.  A dose of 3 mg is recommended to start for headaches including cluster headache. Higher doses up to 15 mg has been reviewed for use in Cluster headache and have been used. The rationale behind using melatonin for cluster is that many theories regarding the cause of Cluster headache center around the disruption of the normal circadian rhythm in the brain.  This helps restore the normal circadian rhythm.   HEADACHE DIET: Foods and beverages which may trigger migraine Note that only 20% of headache patients are food sensitive. You will know if you are food sensitive if you get a headache consistently 20 minutes to 2 hours after eating a certain food. Only cut out a food if it causes headaches, otherwise you might remove foods you enjoy! What matters most for diet is to eat a well balanced healthy diet full of vegetables and low fat protein, and to not miss meals.   Chocolate, other sweets ALL cheeses except cottage and cream cheese Dairy products, yogurt, sour cream, ice cream Liver Meat extracts (Bovril, Marmite, meat tenderizers) Meats or fish which have undergone aging, fermenting, pickling or smoking. These include: Hotdogs,salami,Lox,sausage, mortadellas,smoked salmon, pepperoni, Pickled herring Pods of broad bean (English beans, Chinese pea pods, [place] (fava) beans, lima and navy beans Ripe avocado, ripe banana Yeast extracts or active yeast preparations such as Brewer's or Fleishman's (commercial bakes goods are permitted) Tomato based foods, pizza (lasagna, etc.)   MSG (monosodium glutamate) is disguised as many things; look for these common aliases: Monopotassium  glutamate Autolysed yeast Hydrolysed protein Sodium caseinate "flavorings" "all natural preservatives" Nutrasweet   Avoid all other foods that convincingly provoke headaches.   Resources: The Dizzy [place]air Laundry Your Headache Diet, migrainestrong.com  https://zamora-andrews.com/   Caffeine and Migraine For patients that have migraine, caffeine intake more than 3 days per week can lead to dependency and increased migraine frequency. I would recommend cutting back on your caffeine intake as best you can. The recommended amount of caffeine is 200-300 mg daily, although migraine patients may experience dependency at even lower doses. While you may notice an increase in headache temporarily, cutting back will be helpful for headaches in the long run. For more information on caffeine and migraine, visit: https://americanmigrainefoundation.org/resource-library/caffeine-and-migraine/   Headache Prevention Strategies:   1. Maintain a headache diary; learn to identify and avoid triggers.  - This can be a simple note where you log when you had a headache, associated symptoms, and medications used - There are several smartphone apps developed to help track migraines: Migraine Buddy, Migraine Monitor, Curelator N1-Headache App   Common triggers include: Emotional triggers: Emotional/Upset family or friends Emotional/Upset occupation Business reversal/success Anticipation anxiety Crisis-serious Post-crisis periodNew job/position   Physical triggers: Vacation Day Weekend Strenuous Exercise High Altitude Location New Move Menstrual Day Physical Illness Oversleep/Not enough sleep Weather changes Light: Photophobia or light sesnitivity  treatment involves a balance between desensitization and reduction in overly strong input. Use dark polarized glasses outside, but not inside. Avoid bright or fluorescent light, but do not dim environment to the point  that going into a normally lit room hurts. Consider FL-41 tint lenses, which reduce the most irritating wavelengths without blocking too much light.  These can be obtained at axonoptics.com or theraspecs.com Foods: see list above.   2. Limit use of acute treatments (over-the-counter medications, triptans, etc.) to no more than 2 days per week or 10 days per month to prevent medication overuse headache (rebound headache).     3. Follow a regular schedule (including weekends and holidays): Don't skip meals. Eat a balanced diet. 8 hours of sleep nightly. Minimize stress. Exercise 30 minutes per day. Being overweight is associated with a 5 times increased risk of chronic migraine. Keep well hydrated and drink 6-8 glasses of water per day.   4. Initiate non-pharmacologic measures at the earliest onset of your headache. Rest and quiet environment. Relax and reduce stress. Breathe2Relax is a free app that can instruct you on    some simple relaxtion and breathing techniques. Http://Dawnbuse.com is a    free website that provides teaching videos on relaxation.  Also, there are  many apps that   can be downloaded for "mindful" relaxation.  An app called YOGA NIDRA will help walk you through mindfulness. Another app called Calm can be downloaded to give you a structured mindfulness guide with daily reminders and skill development. Headspace for guided meditation Mindfulness Based Stress Reduction Online Course: www.palousemindfulness.com Cold compresses.   5. Don't wait!! Take the maximum allowable dosage of prescribed medication at the first sign of migraine.   6. Compliance:  Take prescribed medication regularly as directed and at the first sign of a migraine.   7. Communicate:  Call your physician when problems arise, especially if your headaches change, increase in frequency/severity, or become associated with neurological symptoms (weakness, numbness, slurred speech, etc.). Proceed to emergency room  if you experience new or worsening symptoms or symptoms do not resolve, if you have new neurologic symptoms or if headache is severe, or for any concerning symptom.   8. Headache/pain management therapies: Consider various complementary methods, including medication, behavioral therapy, psychological counselling, biofeedback, massage therapy, acupuncture, dry needling, and other modalities.  Such measures may reduce the need for medications. Counseling for pain management, where patients learn to function and ignore/minimize their pain, seems to work very well.   9. Recommend changing family's attention and focus away from patient's headaches. Instead, emphasize daily activities. If first question of day is 'How are your headaches/Do you have a headache today?', then patient will constantly think about headaches, thus making them worse. Goal is to re-direct attention away from headaches, toward daily activities and other distractions.   10. Helpful Websites: www.AmericanHeadacheSociety.org PatentHood.ch www.headaches.org TightMarket.nl www.achenet.org

## 2023-01-29 ENCOUNTER — Emergency Department (HOSPITAL_BASED_OUTPATIENT_CLINIC_OR_DEPARTMENT_OTHER)
Admission: EM | Admit: 2023-01-29 | Discharge: 2023-01-29 | Disposition: A | Discharge status: Home / Self Care | Payer: BC Managed Care – PPO | Attending: Emergency Medicine | Admitting: Emergency Medicine

## 2023-01-29 ENCOUNTER — Encounter (HOSPITAL_BASED_OUTPATIENT_CLINIC_OR_DEPARTMENT_OTHER): Payer: Self-pay | Admitting: Emergency Medicine

## 2023-01-29 DIAGNOSIS — Z23 Encounter for immunization: Secondary | ICD-10-CM | POA: Diagnosis not present

## 2023-01-29 DIAGNOSIS — S01511A Laceration without foreign body of lip, initial encounter: Secondary | ICD-10-CM | POA: Diagnosis not present

## 2023-01-29 DIAGNOSIS — W540XXA Bitten by dog, initial encounter: Secondary | ICD-10-CM | POA: Insufficient documentation

## 2023-01-29 DIAGNOSIS — Y92009 Unspecified place in unspecified non-institutional (private) residence as the place of occurrence of the external cause: Secondary | ICD-10-CM | POA: Insufficient documentation

## 2023-01-29 MED ORDER — AMOXICILLIN-POT CLAVULANATE 875-125 MG PO TABS
1.0000 | ORAL_TABLET | Freq: Once | ORAL | Status: AC
  Administered 2023-01-29: 1 via ORAL
  Filled 2023-01-29: qty 1

## 2023-01-29 MED ORDER — AMOXICILLIN-POT CLAVULANATE 875-125 MG PO TABS: 1.0000 | ORAL_TABLET | Freq: Two times a day (BID) | ORAL | 0 refills | Status: DC

## 2023-01-29 MED ORDER — TETANUS-DIPHTH-ACELL PERTUSSIS 5-2.5-18.5 LF-MCG/0.5 IM SUSY
0.5000 mL | PREFILLED_SYRINGE | Freq: Once | INTRAMUSCULAR | Status: AC
  Administered 2023-01-29: 0.5 mL via INTRAMUSCULAR
  Filled 2023-01-29: qty 0.5

## 2023-01-29 NOTE — Discharge Instructions (Addendum)
Return if any problems.  Suture removal in 5 days °

## 2023-01-29 NOTE — ED Provider Notes (Signed)
[place] AT [facility] Provider Note   CSN: [id number] Arrival date & time: 01/29/23  2133     History  No chief complaint on file.   Rhonda Newton is a 57 y.o. female.  Patient reports she slipped and fell landing on her dog.  Patient complains of a laceration to her right lower lip.  Patient does not recall her last tetanus shot.  Patient reports that the dog shots are up-to-date.  Patient denies any other area of injuries  The history is provided by the patient. No language interpreter was used.       Home Medications Prior to Admission medications   Medication Sig Start Date End Date Taking? Authorizing Provider  amoxicillin-clavulanate (AUGMENTIN) 875-125 MG tablet Take 1 tablet by mouth 2 (two) times daily. 01/29/23  Yes Cheron Schaumann K, PA-C  escitalopram (LEXAPRO) 5 MG tablet Take 1 tablet (5 mg total) by mouth daily. 10/27/22   Novella Olive, FNP  Magnesium 500 MG TABS  11/07/21   [provider]  SUMAtriptan (IMITREX) 25 MG tablet Take 1 tablet (25 mg total) by mouth 3 times/day as needed-between meals & bedtime. 06/29/22   de Peru, Buren Kos, MD      Allergies    Benzodiazepines    Review of Systems   Review of Systems  All other systems reviewed and are negative.   Physical Exam Updated Vital Signs BP (!) 143/81 (BP Location: Right Arm)   Pulse 70   Temp (!) 96.8 F (36 C) (Temporal)   Resp 18   LMP 02/02/2012   SpO2 99%  Physical Exam Vitals reviewed.  Constitutional:      Appearance: Normal appearance.  Cardiovascular:     Rate and Rhythm: Normal rate.  Pulmonary:     Effort: Pulmonary effort is normal.  Skin:    General: Skin is warm.     Comments: 5 mm laceration right lower lip slight gaping, small area crosses the vermilion border.  Neurological:     General: No focal deficit present.     Mental Status: She is alert.  Psychiatric:        Mood and Affect: Mood normal.     ED Results /  Procedures / Treatments   Labs (all labs ordered are listed, but only abnormal results are displayed) Labs Reviewed - No data to display  EKG None  Radiology No results found.  Procedures .Marland KitchenLaceration Repair  Date/Time: 01/29/2023 11:07 PM  Performed by: Elson Areas, PA-C Authorized by: Elson Areas, PA-C   Consent:    Consent obtained:  Verbal   Consent given by:  Patient   Risks, benefits, and alternatives were discussed: yes     Risks discussed:  Infection   Alternatives discussed:  No treatment Universal protocol:    Procedure explained and questions answered to patient or proxy's satisfaction: yes     Patient identity confirmed:  Verbally with patient Anesthesia:    Anesthesia method:  Local infiltration   Local anesthetic:  Bupivacaine 0.5% w/o epi Laceration details:    Location:  Lip   Lip location:  Lower exterior lip   Length (cm):  0.5 Pre-procedure details:    Preparation:  Patient was prepped and draped in usual sterile fashion Exploration:    Wound exploration: wound explored through full range of motion     Contaminated: no   Treatment:    Area cleansed with:  Povidone-iodine   Amount of cleaning:  Standard  Irrigation solution:  Sterile saline   Undermining:  None Skin repair:    Repair method:  Sutures   Suture size:  6-0   Suture material:  Prolene   Suture technique:  Simple interrupted   Number of sutures:  2 Approximation:    Approximation:  Close Repair type:    Repair type:  Intermediate Post-procedure details:    Procedure completion:  Tolerated     Medications Ordered in ED Medications  amoxicillin-clavulanate (AUGMENTIN) 875-125 MG per tablet 1 tablet (has no administration in time range)  Tdap (BOOSTRIX) injection 0.5 mL (has no administration in time range)    ED Course/ Medical Decision Making/ A&P                             Medical Decision Making Risk Prescription drug management.           Final  Clinical Impression(s) / ED Diagnoses Final diagnoses:  Lip laceration, initial encounter    Rx / DC Orders ED Discharge Orders          Ordered    amoxicillin-clavulanate (AUGMENTIN) 875-125 MG tablet  2 times daily        01/29/23 2247           An After Visit Summary was printed and given to the patient.    Elson Areas, Cordelia Poche 01/29/23 2308    Terald Sleeper, MD 01/29/23 2322

## 2023-01-29 NOTE — ED Notes (Signed)
ED Provider at bedside for suturing 

## 2023-01-29 NOTE — ED Triage Notes (Signed)
Pt here from home with a dog bite to to the lower lip , her own dog , shots up to date , unknown tdap

## 2023-01-30 ENCOUNTER — Encounter: Payer: Self-pay | Admitting: Family Medicine

## 2023-01-30 ENCOUNTER — Ambulatory Visit: Payer: BC Managed Care – PPO | Admitting: Family Medicine

## 2023-01-30 VITALS — BP 118/71 | HR 71 | Ht 68.5 in | Wt 186.4 lb

## 2023-01-30 DIAGNOSIS — G43109 Migraine with aura, not intractable, without status migrainosus: Secondary | ICD-10-CM | POA: Diagnosis not present

## 2023-02-28 ENCOUNTER — Ambulatory Visit (HOSPITAL_BASED_OUTPATIENT_CLINIC_OR_DEPARTMENT_OTHER): Payer: BC Managed Care – PPO | Admitting: Family Medicine

## 2023-02-28 ENCOUNTER — Encounter (HOSPITAL_BASED_OUTPATIENT_CLINIC_OR_DEPARTMENT_OTHER): Payer: Self-pay | Admitting: Family Medicine

## 2023-02-28 VITALS — BP 125/81 | HR 65 | Ht 68.5 in | Wt 186.3 lb

## 2023-02-28 DIAGNOSIS — R42 Dizziness and giddiness: Secondary | ICD-10-CM | POA: Diagnosis not present

## 2023-02-28 DIAGNOSIS — Z0000 Encounter for general adult medical examination without abnormal findings: Secondary | ICD-10-CM

## 2023-02-28 DIAGNOSIS — Z1211 Encounter for screening for malignant neoplasm of colon: Secondary | ICD-10-CM | POA: Diagnosis not present

## 2023-02-28 DIAGNOSIS — E041 Nontoxic single thyroid nodule: Secondary | ICD-10-CM | POA: Diagnosis not present

## 2023-02-28 NOTE — Progress Notes (Signed)
    Procedures performed today:    None.  Independent interpretation of notes and tests performed by another provider:   None.  Brief History, Exam, Impression, and Recommendations:    BP 125/81 (BP Location: Right Arm, Patient Position: Sitting, Cuff Size: Normal)   Pulse 65   Ht 5' 8.5" (1.74 m)   Wt 186 lb 4.8 oz (84.5 kg)   LMP 02/02/2012   SpO2 100%   BMI 27.91 kg/m   Thyroid nodule Assessment & Plan: Observed on recent imaging, incidentally noted.  She did have dedicated ultrasound performed in which appearance was concerning enough that recommendation is to proceed with FNA biopsy, we discussed this in the office today.  She is not currently having any concerning symptoms. Order placed today to proceed with FNA biopsy through [facility] imaging, we will follow-up on results, discussed possible outcomes including need for repeat FNA biopsy, possible follow-up ultrasound imaging, need for referral to specialist pending results of biopsy  Orders: -     [place] FNA BX THYROID 1ST LESION AFIRMA; Future  Dizziness Assessment & Plan: Stable currently.  At last office visit, initiating SSRI was discussed and patient initially like to proceed with this and she did pick up medication, however she never did end up starting this due to concerns of potential side effects or adverse reactions.  She generally feels that she is doing okay right now. We discussed options, reasonable to continue with monitoring and hold off on initiating SSRI at this time Will continue to monitor moving forward   Colon cancer screening -     Ambulatory referral to Gastroenterology  Wellness examination -     CBC with Differential/Platelet; Future -     Comprehensive metabolic panel; Future -     Hemoglobin A1c; Future -     Lipid panel; Future -     TSH Rfx on Abnormal to Free T4; Future  Return in about 4 months (around 06/30/2023) for CPE with labs 1 week prior, these appointments have already been  scheduled.   ___________________________________________ Cornelio Parkerson de Peru, MD, ABFM, CAQSM Primary Care and Sports Medicine [facility]

## 2023-02-28 NOTE — Assessment & Plan Note (Signed)
Stable currently.  At last office visit, initiating SSRI was discussed and patient initially like to proceed with this and she did pick up medication, however she never did end up starting this due to concerns of potential side effects or adverse reactions.  She generally feels that she is doing okay right now. We discussed options, reasonable to continue with monitoring and hold off on initiating SSRI at this time Will continue to monitor moving forward

## 2023-02-28 NOTE — Assessment & Plan Note (Signed)
Observed on recent imaging, incidentally noted.  She did have dedicated ultrasound performed in which appearance was concerning enough that recommendation is to proceed with FNA biopsy, we discussed this in the office today.  She is not currently having any concerning symptoms. Order placed today to proceed with FNA biopsy through [facility] imaging, we will follow-up on results, discussed possible outcomes including need for repeat FNA biopsy, possible follow-up ultrasound imaging, need for referral to specialist pending results of biopsy

## 2023-03-07 ENCOUNTER — Telehealth: Payer: Self-pay | Admitting: Gastroenterology

## 2023-03-07 NOTE — Telephone Encounter (Signed)
Good Morning Dr. Chales Abrahams,  Supervising MD for today 6/25 AM   Patient called stating that she wanted to schedule her colonoscopy. Patient stated that she had her last colonoscopy in 2019 and also had a EGD (because she has had esophageal cancer before)  with Jarold Song. Patient had surgery with [facility] in 2019 as well. Will you please review patients records in EPIC and advise on scheduling patient?   Thank you.

## 2023-03-08 NOTE — Telephone Encounter (Signed)
Needs OV in APP clinic or mine whichever is sooner RG

## 2023-03-09 ENCOUNTER — Encounter: Payer: Self-pay | Admitting: Physician Assistant

## 2023-03-09 NOTE — Telephone Encounter (Signed)
Patient schedule for next available OV with APP

## 2023-04-07 ENCOUNTER — Other Ambulatory Visit: Payer: BC Managed Care – PPO

## 2023-04-17 ENCOUNTER — Other Ambulatory Visit (HOSPITAL_COMMUNITY)
Admission: RE | Admit: 2023-04-17 | Discharge: 2023-04-17 | Disposition: A | Discharge status: Home / Self Care | Payer: BC Managed Care – PPO | Source: Ambulatory Visit | Attending: Interventional Radiology | Admitting: Interventional Radiology

## 2023-04-17 ENCOUNTER — Ambulatory Visit: Admission: RE | Admit: 2023-04-17 | Payer: BC Managed Care – PPO | Source: Ambulatory Visit

## 2023-04-17 DIAGNOSIS — E041 Nontoxic single thyroid nodule: Secondary | ICD-10-CM | POA: Diagnosis not present

## 2023-04-17 DIAGNOSIS — E0789 Other specified disorders of thyroid: Secondary | ICD-10-CM | POA: Diagnosis not present

## 2023-04-20 ENCOUNTER — Inpatient Hospital Stay: Payer: BC Managed Care – PPO | Attending: Oncology | Admitting: Oncology

## 2023-04-20 ENCOUNTER — Encounter: Payer: Self-pay | Admitting: Oncology

## 2023-04-20 VITALS — BP 116/58 | HR 66 | Temp 97.9°F | Resp 18 | Ht 68.0 in | Wt 185.3 lb

## 2023-04-20 DIAGNOSIS — Z8501 Personal history of malignant neoplasm of esophagus: Secondary | ICD-10-CM | POA: Insufficient documentation

## 2023-04-20 DIAGNOSIS — Z08 Encounter for follow-up examination after completed treatment for malignant neoplasm: Secondary | ICD-10-CM | POA: Insufficient documentation

## 2023-04-20 DIAGNOSIS — C160 Malignant neoplasm of cardia: Secondary | ICD-10-CM

## 2023-04-20 DIAGNOSIS — E041 Nontoxic single thyroid nodule: Secondary | ICD-10-CM | POA: Insufficient documentation

## 2023-04-20 DIAGNOSIS — Z87891 Personal history of nicotine dependence: Secondary | ICD-10-CM | POA: Diagnosis not present

## 2023-04-20 NOTE — Progress Notes (Signed)
[facility] OFFICE PROGRESS NOTE   Diagnosis: Esophagus cancer  INTERVAL HISTORY:   Rhonda Newton returns as scheduled.  She feels well.  No dysphagia.  She underwent an FNA biopsy of a left thyroid nodule 04/17/2023.  The pathology was consistent with a benign follicular nodule.  She was last seen at [facility] in November 2023. She reports headaches improved since she began wearing corrective glasses.   Objective:  Vital signs in last 24 hours:  Blood pressure (!) 116/58, pulse 66, temperature 97.9 F (36.6 C), temperature source Oral, resp. rate 18, height 5\' 8"  (1.727 m), weight 185 lb 4.8 oz (84.1 kg), last menstrual period 02/02/2012, SpO2 100%.     Lymphatics: No cervical, supraclavicular, axillary, or inguinal nodes Resp: Lungs clear bilaterally Cardio: Regular rate and rhythm GI: No hepatosplenomegaly, no mass, nontender Vascular: No leg edema  Lab Results:  Lab Results  Component Value Date   WBC 6.1 06/22/2022   HGB 13.3 06/22/2022   HCT 41.2 06/22/2022   MCV 87 06/22/2022   PLT 311 06/22/2022   NEUTROABS 3.7 06/22/2022    CMP  Lab Results  Component Value Date   NA 140 06/22/2022   K 4.4 06/22/2022   CL 102 06/22/2022   CO2 24 06/22/2022   GLUCOSE 90 06/22/2022   BUN 13 06/22/2022   CREATININE 0.95 06/22/2022   CALCIUM 9.6 06/22/2022   PROT 6.8 06/22/2022   ALBUMIN 4.3 06/22/2022   AST 19 06/22/2022   ALT 14 06/22/2022   ALKPHOS 113 06/22/2022   BILITOT 0.7 06/22/2022   GFRNONAA >60 09/11/2018   GFRAA >60 09/11/2018    Imaging:  [place] FNA BX THYROID 1ST LESION AFIRMA  Result Date: 04/17/2023 INDICATION: Indeterminate thyroid nodule EXAM: ULTRASOUND GUIDED FINE NEEDLE ASPIRATION OF INDETERMINATE THYROID NODULE COMPARISON:  Thyroid ultrasound on 11/23/2022 MEDICATIONS: None COMPLICATIONS: None immediate. TECHNIQUE: Informed written consent was obtained from the patient after a discussion of the risks, benefits and alternatives to treatment.  Questions regarding the procedure were encouraged and answered. A timeout was performed prior to the initiation of the procedure. Pre-procedural ultrasound scanning demonstrated unchanged size and appearance of the indeterminate nodule within the left lobe of the thyroid gland. The procedure was planned. The neck was prepped in the usual sterile fashion, and a sterile drape was applied covering the operative field. A timeout was performed prior to the initiation of the procedure. Local anesthesia was provided with 1% lidocaine. Under direct ultrasound guidance, 5 FNA biopsies were performed with 25 gauge needles. Multiple ultrasound images were saved for procedural documentation purposes. The samples were prepared and submitted to pathology. Limited post procedural scanning was negative for hematoma or additional complication. Dressings were placed. The patient tolerated the above procedures procedure well without immediate postprocedural complication. FINDINGS: Nodule reference number based on prior diagnostic ultrasound: 1 Maximum size: 1.8 cm Location: Left; Mid ACR TI-RADS risk category: TR4 (4-6 points) Reason for biopsy: meets ACR TI-RADS criteria Ultrasound imaging confirms appropriate placement of the needles within the thyroid nodule. IMPRESSION: Technically successful ultrasound guided fine needle aspiration of 1.8 cm left mid thyroid nodule. Electronically Signed   By: Irish Lack M.D.   On: 04/17/2023 17:03    Medications: I have reviewed the patient's current medications.   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; HER2 negative CTs 05/04/2018, distal esophagus mass extending to to  below the GE junction, no evidence of metastatic disease, 4 mm nonspecific right upper lobe nodule Upper EUS 05/11/2018- large  fungating ulcerating mass in the distal esophagus about 34 to 38 cm from the incisors, tumor extends to the GE junction; staged T3 N1 MX Radiation 05/28/2018-07/04/2018 Cycle 1 weekly carboplatin/Taxol 05/29/2018 Cycle 2 weekly carboplatin/Taxol 06/06/2018 Cycle 3 weekly carboplatin/Taxol 06/12/2018 Cycle 4 weekly carboplatin/Taxol 06/20/2018 Cycle 5 weekly carboplatin/Taxol 06/26/2018 PET scan 08/16/2018- decreased hypermetabolic activity and thickness of the distal esophagus consistent with treated residual neoplasm, no evidence of metastatic disease Esophagectomy and jejunostomy feeding tube 08/17/2018-complete pathologic response CTs 03/07/2019- new right middle lobe atelectasis or scarring; stable tiny right upper lobe pulmonary nodule; status post esophagogastrectomy with expected postoperative appearance; no evidence of metastatic disease in the abdomen or pelvis.  Similar right adrenal thickening. CT 04/08/2020-no evidence of recurrent disease CTs at [facility] 09/17/2020-no evidence of recurrent disease CTs at [facility] 08/12/2021-no evidence of recurrent disease  CTs at [facility] 08/10/2022-no evidence of recurrent disease Migraine headaches Multiple family members with breast cancer-negative genetic testing 2019 History of tobacco use     Disposition: Rhonda Newton mains in clinical remission from  gastroesophageal cancer.  She is now 5 years out from diagnosis.  She is scheduled for follow-up at [facility] in November.  She was discharged from the medical oncology clinic today.  I am available to see her in the future as needed.  She will continue clinical follow-up with Dr. De Peru   , MD  04/20/2023  12:00 PM

## 2023-05-22 ENCOUNTER — Ambulatory Visit (INDEPENDENT_AMBULATORY_CARE_PROVIDER_SITE_OTHER): Payer: BC Managed Care – PPO | Admitting: Physician Assistant

## 2023-05-22 ENCOUNTER — Encounter: Payer: Self-pay | Admitting: Physician Assistant

## 2023-05-22 DIAGNOSIS — C160 Malignant neoplasm of cardia: Secondary | ICD-10-CM

## 2023-05-22 DIAGNOSIS — Z86010 Personal history of colonic polyps: Secondary | ICD-10-CM

## 2023-05-22 MED ORDER — NA SULFATE-K SULFATE-MG SULF 17.5-3.13-1.6 GM/177ML PO SOLN: 1.0000 | ORAL | 0 refills | Status: DC

## 2023-05-22 NOTE — Patient Instructions (Addendum)
_______________________________________________________  If your blood pressure at your visit was 140/90 or greater, please contact your primary care physician to follow up on this.  If you are age 56 or younger, your body mass index should be between 19-25. Your Body mass index is 27.72 kg/m. If this is out of the aformentioned range listed, please consider follow up with your Primary Care Provider.  ________________________________________________________  The [facility] providers would like to encourage you to use [facility] to communicate with providers for non-urgent requests or questions.  Due to long hold times on the telephone, sending your provider a message by [facility] may be a faster and more efficient way to get a response.  Please allow 48 business hours for a response.  Please remember that this is for non-urgent requests.  _______________________________________________________  Rhonda Newton have been scheduled for a colonoscopy. Please follow written instructions given to you at your visit today.   Please pick up your prep supplies at the pharmacy within the next 1-3 days.  If you use inhalers (even only as needed), please bring them with you on the day of your procedure.  DO NOT TAKE 7 DAYS PRIOR TO TEST- Trulicity (dulaglutide) Ozempic, Wegovy (semaglutide) Mounjaro (tirzepatide) Bydureon Bcise (exanatide extended release)  DO NOT TAKE 1 DAY PRIOR TO YOUR TEST Rybelsus (semaglutide) Adlyxin (lixisenatide) Victoza (liraglutide) Byetta (exanatide) ___________________________________________________________________________  Due to recent changes in healthcare laws, you may see the results of your imaging and laboratory studies on MyChart before your provider has had a chance to review them.  We understand that in some cases there may be results that are confusing or concerning to you. Not all laboratory results come back in the same time frame and the provider may be waiting for  multiple results in order to interpret others.  Please give [place] 48 hours in order for your provider to thoroughly review all the results before contacting the office for clarification of your results.   Thank you for entrusting me with your care and choosing [facility].  Amy Esterwood, PA-C

## 2023-05-22 NOTE — Progress Notes (Signed)
Subjective:    Patient ID: Rhonda Newton, female    DOB: 1966-04-16, 57 y.o.   MRN: [id number]  HPI  Rhonda Newton is a pleasant 57 year old white female, new to GI today referred by Dr. De Peru for follow-up colonoscopy.  Patient had previously been followed by Dr. Doristine Johns [facility] and states she had prior colonoscopy a little over 5 years ago.  She believes she had at least 1 polyp and was told to follow-up in 5 years.  She wants to have all of her medical information within the same system/epic and therefore asked to be referred here for further GI care. 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 She underwent radiation and chemotherapy, followed by esophagectomy with jejunal tube placement in December 2019. She has been followed by oncology closely since then with serial CTs done at [facility] with no evidence of recurrent disease.  She was just recently seen by Dr. Truett Perna in August 2024 and released 56 years out.  She says she has 1 more follow-up CT to [facility].  She says generally she has done very well since her surgery, had to learn how to repeat, has to be careful with carbs etc. which can cause issues with dumping, and also does not tolerate red meat.  She sleeps elevated, and says she does not have any problems with sour brash or vomiting as long as she does not eat for at least 3 hours prior to bedtime. She has no current concerns about changes in bowel habits, no abdominal pain, no melena or hematochezia.  Other medical problems include bipolar disorder and anxiety/panic.   Review of Systems Pertinent positive and negative review of systems were noted in the above HPI section.  All other review of systems was otherwise negative.   Outpatient Encounter Medications as of 05/22/2023  Medication Sig   docusate sodium (COLACE) 100 MG capsule Take 100 mg by  mouth daily.   Magnesium 500 MG TABS    Na Sulfate-K Sulfate-Mg Sulf (SUPREP BOWEL PREP KIT) 17.5-3.13-1.6 GM/177ML SOLN Take 1 kit by mouth as directed.   SUMAtriptan (IMITREX) 25 MG tablet Take 1 tablet (25 mg total) by mouth 3 times/day as needed-between meals & bedtime.   No facility-administered encounter medications on file as of 05/22/2023.   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   Hypertriglyceridemia 12/28/2021   Visual disturbance 09/22/2021   BPPV (benign paroxysmal positional vertigo) 08/30/2021   Migraine 06/29/2021   Wellness examination 06/29/2021   Elevated serum creatinine 06/29/2021   Barrett's esophagus with dysplasia 08/16/2018   Genetic testing 07/06/2018   Family history of breast cancer    Family history of colon cancer    Family history of prostate cancer    Family history of melanoma    Malignant neoplasm of lower third of esophagus (HCC) 05/16/2018   Benzodiazepine causing adverse effect in therapeutic use, sequela 02/13/2012   Bipolar affective disorder, mixed, moderate degree (HCC) 02/10/2012   Panic disorder with agoraphobia, moderate agoraphobic avoidance and moderate panic attacks 02/10/2012   Social History   Socioeconomic History   Marital status: Divorced  Spouse name: Not on file   Number of children: 1   Years of education: Not on file   Highest education level: Not on file  Occupational History   Occupation: Print production planner  Tobacco Use   Smoking status: Former    Current packs/day: 0.00    Types: Cigarettes    Quit date: 05/14/2018    Years since quitting: 5.0   Smokeless tobacco: Never  Vaping Use   Vaping status: Never Used  Substance and Sexual Activity   Alcohol use: No   Drug use: Not  Currently    Types: Marijuana   Sexual activity: Yes    Birth control/protection: Post-menopausal  Other Topics Concern   Not on file  Social History Narrative   Not on file   Social Determinants of Health   Financial Resource Strain: Patient Declined (02/24/2023)   Overall Financial Resource Strain (CARDIA)    Difficulty of Paying Living Expenses: Patient declined  Food Insecurity: Patient Declined (02/24/2023)   Hunger Vital Sign    Worried About Running Out of Food in the Last Year: Patient declined    Ran Out of Food in the Last Year: Patient declined  Transportation Needs: Unknown (02/24/2023)   PRAPARE - Administrator, Civil Service (Medical): Patient declined    Lack of Transportation (Non-Medical): No  Physical Activity: Insufficiently Active (02/24/2023)   Exercise Vital Sign    Days of Exercise per Week: 5 days    Minutes of Exercise per Session: 20 min  Stress: No Stress Concern Present (02/24/2023)   [employer] of Occupational Health - Occupational Stress Questionnaire    Feeling of Stress : Only a little  Social Connections: Unknown (02/24/2023)   Social Connection and Isolation Panel [NHANES]    Frequency of Communication with Friends and Family: Patient declined    Frequency of Social Gatherings with Friends and Family: Patient declined    Attends Religious Services: Patient declined    Database administrator or Organizations: Patient declined    Attends Banker Meetings: Not on file    Marital Status: Patient declined  Intimate Partner Violence: Not on file    Rhonda Newton's family history includes Barrett's esophagus in her maternal aunt and mother; Breast cancer in her paternal aunt and another family member; Breast cancer (age of onset: 8) in her sister; Breast cancer (age of onset: 61) in her mother; Breast cancer (age of onset: 90) in her maternal aunt; COPD in her paternal uncle; Colon cancer in an other family member; Congestive  Heart Failure in her paternal grandmother; Dementia in her paternal grandmother; Esophageal cancer in an other family member; Heart disease in her father, maternal grandfather, and maternal grandmother; Hypertension in her mother; Kidney cancer in her paternal grandfather; Melanoma in her cousin; Pancreatic cancer in an other family member; Prostate cancer in her cousin and maternal uncle.      Objective:    Vitals:   05/22/23 1410  BP: 110/70  Pulse: 73    Physical Exam Well-developed well-nourished  WF  in no acute distress.  Height, Weight,185  BMI 27.2  HEENT; nontraumatic normocephalic, EOMI, PE R LA, sclera anicteric. Oropharynx;not examined  Neck; supple, no JVD Cardiovascular; regular rate and rhythm with S1-S2, no murmur rub or gallop Pulmonary; Clear bilaterally Abdomen; soft, nontender, nondistended, no palpable mass or hepatosplenomegaly, bowel sounds are active, upper abd/lower chest incis scar Rectal;not done today  Skin; benign exam, no jaundice rash or appreciable lesions Extremities; no  clubbing cyanosis or edema skin warm and dry Neuro/Psych; alert and oriented x4, grossly nonfocal mood and affect appropriate        Assessment & Plan:   #22 57 year old white female with history of adenomatous colon polyps-prior colonoscopy done per Dr. Doristine Johns [facility] approximately 5 years ago and told to have 5-year interval follow-up  No current complaints, bowel movements have been regular, no melena or hematochezia no complaints of abdominal pain.  Occasionally if she gets constipated will see a scant amount of blood on the tissue from a hemorrhoid.  #2 history of poorly differentiated adenocarcinoma of the GE junction status post total esophagectomy 2019 after undergoing adjuvant radiation/chemotherapy.  Surgery was done at [facility].  She has had close serial follow-up over the past 5 years with no evidence of recurrent disease.  Just saw oncology and had been released from Dr.  Kalman Drape care, she says she has follow-up CT to be done at [facility] in November.   #3 bipolar disorder #4.  Anxiety #5 family history of breast cancer and esophageal cancer no family history of colon cancer  Plan; Patient will be scheduled for colonoscopy with Dr. Barron Alvine.  Procedure was discussed in detail with the patient including indications risks and benefits and she is agreeable to proceed. Discussed with Dr. Barron Alvine regarding scheduling in [facility] versus hospital.  She has not had any aspiration history, and therefore should be acceptable for procedure in [facility].  Patient will sign a release and will obtain copies of her prior colonoscopy and path reports from [place].  Plan  Diane Hanel Oswald Hillock PA-C 05/22/2023   Cc: de Peru, Raymond J, MD

## 2023-05-23 NOTE — Progress Notes (Signed)
Agree with the assessment and plan as outlined by Amy Esterwood, PA-C.  Vito Cirigliano, DO, FACG  

## 2023-06-15 ENCOUNTER — Telehealth: Payer: Self-pay

## 2023-06-15 NOTE — Telephone Encounter (Signed)
Received 12 pages of records from [facility] with colonoscopy/EGD/pathology reports.  Records in Amy's Inbox for her review.

## 2023-06-21 ENCOUNTER — Encounter: Payer: Self-pay | Admitting: Gastroenterology

## 2023-06-26 ENCOUNTER — Encounter (HOSPITAL_BASED_OUTPATIENT_CLINIC_OR_DEPARTMENT_OTHER): Payer: Self-pay | Admitting: Family Medicine

## 2023-06-26 ENCOUNTER — Other Ambulatory Visit (HOSPITAL_BASED_OUTPATIENT_CLINIC_OR_DEPARTMENT_OTHER): Payer: BC Managed Care – PPO

## 2023-06-26 DIAGNOSIS — Z0000 Encounter for general adult medical examination without abnormal findings: Secondary | ICD-10-CM | POA: Diagnosis not present

## 2023-06-27 LAB — CBC WITH DIFFERENTIAL/PLATELET
Basophils Absolute: 0.1 10*3/uL (ref 0.0–0.2)
Basos: 1 %
EOS (ABSOLUTE): 0.1 10*3/uL (ref 0.0–0.4)
Eos: 2 %
Hematocrit: 41.6 % (ref 34.0–46.6)
Hemoglobin: 13.4 g/dL (ref 11.1–15.9)
Immature Grans (Abs): 0 10*3/uL (ref 0.0–0.1)
Immature Granulocytes: 0 %
Lymphocytes Absolute: 1.5 10*3/uL (ref 0.7–3.1)
Lymphs: 24 %
MCH: 29.1 pg (ref 26.6–33.0)
MCHC: 32.2 g/dL (ref 31.5–35.7)
MCV: 90 fL (ref 79–97)
Monocytes Absolute: 0.7 10*3/uL (ref 0.1–0.9)
Monocytes: 11 %
Neutrophils Absolute: 3.8 10*3/uL (ref 1.4–7.0)
Neutrophils: 62 %
Platelets: 317 10*3/uL (ref 150–450)
RBC: 4.61 x10E6/uL (ref 3.77–5.28)
RDW: 13.5 % (ref 11.7–15.4)
WBC: 6.2 10*3/uL (ref 3.4–10.8)

## 2023-06-27 LAB — COMPREHENSIVE METABOLIC PANEL
ALT: 15 [IU]/L (ref 0–32)
AST: 17 [IU]/L (ref 0–40)
Albumin: 4.2 g/dL (ref 3.8–4.9)
Alkaline Phosphatase: 138 [IU]/L — ABNORMAL HIGH (ref 44–121)
BUN/Creatinine Ratio: 15 (ref 9–23)
BUN: 13 mg/dL (ref 6–24)
Bilirubin Total: 0.7 mg/dL (ref 0.0–1.2)
CO2: 22 mmol/L (ref 20–29)
Calcium: 9.2 mg/dL (ref 8.7–10.2)
Chloride: 103 mmol/L (ref 96–106)
Creatinine, Ser: 0.89 mg/dL (ref 0.57–1.00)
Globulin, Total: 2.5 g/dL (ref 1.5–4.5)
Glucose: 92 mg/dL (ref 70–99)
Potassium: 4.1 mmol/L (ref 3.5–5.2)
Sodium: 139 mmol/L (ref 134–144)
Total Protein: 6.7 g/dL (ref 6.0–8.5)
eGFR: 76 mL/min/{1.73_m2} (ref >59)

## 2023-06-27 LAB — HEMOGLOBIN A1C
Est. average glucose Bld gHb Est-mCnc: 117 mg/dL
Hgb A1c MFr Bld: 5.7 % — ABNORMAL HIGH (ref 4.8–5.6)

## 2023-06-27 LAB — LIPID PANEL
Chol/HDL Ratio: 3.7 {ratio} (ref 0.0–4.4)
Cholesterol, Total: 176 mg/dL (ref 100–199)
HDL: 48 mg/dL (ref >39)
LDL Chol Calc (NIH): 102 mg/dL — ABNORMAL HIGH (ref 0–99)
Triglycerides: 145 mg/dL (ref 0–149)
VLDL Cholesterol Cal: 26 mg/dL (ref 5–40)

## 2023-06-27 LAB — TSH RFX ON ABNORMAL TO FREE T4: TSH: 0.639 u[IU]/mL (ref 0.450–4.500)

## 2023-06-29 ENCOUNTER — Other Ambulatory Visit (HOSPITAL_BASED_OUTPATIENT_CLINIC_OR_DEPARTMENT_OTHER): Payer: Self-pay | Admitting: Family Medicine

## 2023-07-03 ENCOUNTER — Ambulatory Visit (INDEPENDENT_AMBULATORY_CARE_PROVIDER_SITE_OTHER): Payer: BC Managed Care – PPO | Admitting: Family Medicine

## 2023-07-03 ENCOUNTER — Telehealth (HOSPITAL_BASED_OUTPATIENT_CLINIC_OR_DEPARTMENT_OTHER): Payer: Self-pay | Admitting: Cardiology

## 2023-07-03 ENCOUNTER — Encounter (HOSPITAL_BASED_OUTPATIENT_CLINIC_OR_DEPARTMENT_OTHER): Payer: Self-pay | Admitting: Family Medicine

## 2023-07-03 VITALS — BP 131/78 | HR 72 | Ht 68.5 in | Wt 183.4 lb

## 2023-07-03 DIAGNOSIS — R748 Abnormal levels of other serum enzymes: Secondary | ICD-10-CM

## 2023-07-03 DIAGNOSIS — Z0000 Encounter for general adult medical examination without abnormal findings: Secondary | ICD-10-CM

## 2023-07-03 NOTE — Progress Notes (Signed)
Subjective:    CC: Annual Physical Exam  HPI: Rhonda Newton is a 57 y.o. presenting for annual physical  I reviewed the past medical history, family history, social history, surgical history, and allergies today and no changes were needed.  Please see the problem list section below in epic for further details.  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   Hypertriglyceridemia 12/28/2021   Thyroid nodule incidentally noted on imaging study 10/27/2022   Past Surgical History: Past Surgical History:  Procedure Laterality Date   cataracts Bilateral    age 20   ESOPHAGECTOMY  2019   at [facility]   INTRAUTERINE DEVICE INSERTION     [place]   IUD REMOVAL     Social History: Social History   Socioeconomic History   Marital status: Divorced    Spouse name: Not on file   Number of children: 1   Years of education: Not on file   Highest education level: Not on file  Occupational History   Occupation: Print production planner  Tobacco Use   Smoking status: Former    Current packs/day: 0.00    Average packs/day: 1 pack/day for 15.0 years (15.0 ttl pk-yrs)    Types: Cigarettes    Quit date: 05/14/2018    Years since quitting: 5.1   Smokeless tobacco: Never  Vaping Use   Vaping status: Never Used  Substance and Sexual Activity   Alcohol use: No   Drug use: Not Currently    Types: Marijuana   Sexual activity: Not Currently    Birth control/protection: None  Other Topics Concern   Not on file  Social History Narrative   Not on file   Social Determinants of Health   Financial Resource Strain: Patient Declined (02/24/2023)   Overall Financial Resource Strain (CARDIA)    Difficulty of Paying Living  Expenses: Patient declined  Food Insecurity: Patient Declined (02/24/2023)   Hunger Vital Sign    Worried About Running Out of Food in the Last Year: Patient declined    Ran Out of Food in the Last Year: Patient declined  Transportation Needs: Unknown (02/24/2023)   PRAPARE - Administrator, Civil Service (Medical): Patient declined    Lack of Transportation (Non-Medical): No  Physical Activity: Insufficiently Active (02/24/2023)   Exercise Vital Sign    Days of Exercise per Week: 5 days    Minutes of Exercise per Session: 20 min  Stress: No Stress Concern Present (02/24/2023)   [employer] of Occupational Health - Occupational Stress Questionnaire    Feeling of Stress : Only a little  Social Connections: Unknown (02/24/2023)   Social Connection and Isolation Panel [NHANES]    Frequency of Communication with Friends and Family: Patient declined    Frequency of Social Gatherings with Friends and Family: Patient declined    Attends Religious Services: Patient declined    Database administrator or Organizations: Patient declined    Attends Engineer, structural: Not on file    Marital Status: Patient declined   Family History: Family History  Problem Relation Age of Onset   Barrett's esophagus Mother    Hypertension Mother  Breast cancer Mother 60   Heart disease Father    Breast cancer Sister 67   Heart disease Maternal Grandmother    Heart disease Maternal Grandfather    Congestive Heart Failure Paternal Grandmother    Dementia Paternal Grandmother    Kidney cancer Paternal Grandfather    Breast cancer Maternal Aunt 87   Barrett's esophagus Maternal Aunt    Prostate cancer Maternal Uncle        dx in his mid 31s   Breast cancer Paternal Aunt        dx in her 68s   COPD Paternal Uncle    Melanoma Cousin        mat first cousin; dx 104s-40s   Prostate cancer Cousin        mat first cousin dx in his mid 55s   Breast cancer Other        PGMs sister    Colon cancer Other        PGMs sister   Pancreatic cancer Other        MGFs sister   Esophageal cancer Other        mggm most likely this type of cancer   Allergies: Allergies  Allergen Reactions   Benzodiazepines     Clouding of cognition and disinhibition   Medications: See med rec.  Review of Systems: No headache, visual changes, nausea, vomiting, diarrhea, constipation, dizziness, abdominal pain, skin rash, fevers, chills, night sweats, swollen lymph nodes, weight loss, chest pain, body aches, joint swelling, muscle aches, shortness of breath, mood changes, visual or auditory hallucinations.  Objective:    BP 131/78 (BP Location: Right Arm, Patient Position: Sitting, Cuff Size: Normal)   Pulse 72   Ht 5' 8.5" (1.74 m)   Wt 183 lb 6.4 oz (83.2 kg)   LMP 02/02/2012   SpO2 100%   BMI 27.48 kg/m   General: Well Developed, well nourished, and in no acute distress. Neuro: Alert and oriented x3, extra-ocular muscles intact, sensation grossly intact. Cranial nerves II through XII are intact, motor, sensory, and coordinative functions are all intact. HEENT: Normocephalic, atraumatic, pupils equal round reactive to light, neck supple, no masses, no lymphadenopathy, thyroid nonpalpable. Oropharynx, nasopharynx, external ear canals are unremarkable. Skin: Warm and dry, no rashes noted.  Cardiac: Regular rate and rhythm, no murmurs rubs or gallops.  Respiratory: Clear to auscultation bilaterally. Not using accessory muscles, speaking in full sentences. Abdominal: Soft, nontender, nondistended, positive bowel sounds, no masses, no organomegaly. Musculoskeletal: Shoulder, elbow, wrist, hip, knee, ankle stable, and with full range of motion.  Impression and Recommendations:    Elevated alkaline phosphatase level -     Gamma GT -     Hepatic function panel  Wellness examination Assessment & Plan: Routine HCM labs reviewed. HCM reviewed/discussed. Anticipatory guidance regarding  healthy weight, lifestyle and choices given. Recommend healthy diet.  Recommend approximately 150 minutes/week of moderate intensity exercise Recommend regular dental and vision exams Always use seatbelt/lap and shoulder restraints Recommend using smoke alarms and checking batteries at least twice a year Recommend using sunscreen when outside Discussed colon cancer screening recommendations, options.  Patient is UTD Discussed recommendations for shingles vaccine.  Patient is UTD, received earlier this year, HM updated Discussed tetanus immunization recommendations, patient is UTD Recommend seasonal influenza vaccine, patient has received for this year, HM updated   Patient does have some concerns about exercise intolerance/exertional symptoms.  She has had further evaluation with cardiology related to dizziness that she was experiencing.  At that time, cardiology had considered proceeding with echocardiogram, however was held off as symptoms of dizziness were not thought to be cardiac in nature.  Given exertional symptoms, do feel that further evaluation with cardiology would be appropriate in order to exclude this as possible underlying cause.  Recommend that she reach out to cardiology office to schedule follow-up appointment and to review exertional issues.  Patient noted to have mildly elevated alkaline phosphatase on recent labs.  We will proceed with recheck today as well as assessment of gamma GT for further evaluation.  Return in about 4 months (around 11/03/2023).   ___________________________________________ Colden Samaras de Peru, MD, ABFM, CAQSM Primary Care and Sports Medicine [facility]

## 2023-07-03 NOTE — Telephone Encounter (Signed)
Received a message via pt schedule requesting an echo and follow up after with Dr. Cristal Deer due to extreme exercise intolerance.   Please advise regarding order placement.

## 2023-07-03 NOTE — Assessment & Plan Note (Signed)
Routine HCM labs reviewed. HCM reviewed/discussed. Anticipatory guidance regarding healthy weight, lifestyle and choices given. Recommend healthy diet.  Recommend approximately 150 minutes/week of moderate intensity exercise Recommend regular dental and vision exams Always use seatbelt/lap and shoulder restraints Recommend using smoke alarms and checking batteries at least twice a year Recommend using sunscreen when outside Discussed colon cancer screening recommendations, options.  Patient is UTD Discussed recommendations for shingles vaccine.  Patient is UTD, received earlier this year, HM updated Discussed tetanus immunization recommendations, patient is UTD Recommend seasonal influenza vaccine, patient has received for this year, HM updated

## 2023-07-03 NOTE — Patient Instructions (Signed)
  Medication Instructions:  Your physician recommends that you continue on your current medications as directed. Please refer to the Current Medication list given to you today. --If you need a refill on any your medications before your next appointment, please call your pharmacy first. If no refills are authorized on file call the office.-- Lab Work: Your physician has recommended that you have lab work today: yes If you have labs (blood work) drawn today and your tests are completely normal, you will receive your results via MyChart message OR a phone call from our staff.  Please ensure you check your voicemail in the event that you authorized detailed messages to be left on a delegated number. If you have any lab test that is abnormal or we need to change your treatment, we will call you to review the results.  Referrals/Procedures/Imaging: no  Follow-Up: Your next appointment:   Your physician recommends that you schedule a follow-up appointment in: 3-4 months with Dr. de Peru  You will receive a text message or e-mail with a link to a survey about your care and experience with [place] today! We would greatly appreciate your feedback!   Thanks for letting us be apart of your health journey!!  Primary Care and Sports Medicine   Dr. Ceasar Mons Peru   We encourage you to activate your patient portal called "MyChart".  Sign up information is provided on this After Visit Summary.  MyChart is used to connect with patients for Virtual Visits (Telemedicine).  Patients are able to view lab/test results, encounter notes, upcoming appointments, etc.  Non-urgent messages can be sent to your provider as well. To learn more about what you can do with MyChart, please visit --  [email or web address].

## 2023-07-04 ENCOUNTER — Ambulatory Visit (AMBULATORY_SURGERY_CENTER): Payer: BC Managed Care – PPO | Admitting: Gastroenterology

## 2023-07-04 ENCOUNTER — Encounter: Payer: Self-pay | Admitting: Gastroenterology

## 2023-07-04 VITALS — BP 102/57 | HR 70 | Temp 97.5°F | Resp 16 | Ht 68.0 in | Wt 183.0 lb

## 2023-07-04 DIAGNOSIS — Z09 Encounter for follow-up examination after completed treatment for conditions other than malignant neoplasm: Secondary | ICD-10-CM | POA: Diagnosis not present

## 2023-07-04 DIAGNOSIS — K640 First degree hemorrhoids: Secondary | ICD-10-CM

## 2023-07-04 DIAGNOSIS — Z860101 Personal history of adenomatous and serrated colon polyps: Secondary | ICD-10-CM | POA: Diagnosis not present

## 2023-07-04 DIAGNOSIS — Z1211 Encounter for screening for malignant neoplasm of colon: Secondary | ICD-10-CM | POA: Diagnosis not present

## 2023-07-04 LAB — HEPATIC FUNCTION PANEL
ALT: 15 [IU]/L (ref 0–32)
AST: 19 [IU]/L (ref 0–40)
Albumin: 4.1 g/dL (ref 3.8–4.9)
Alkaline Phosphatase: 132 [IU]/L — ABNORMAL HIGH (ref 44–121)
Bilirubin Total: 0.5 mg/dL (ref 0.0–1.2)
Bilirubin, Direct: 0.13 mg/dL (ref 0.00–0.40)
Total Protein: 6.9 g/dL (ref 6.0–8.5)

## 2023-07-04 LAB — GAMMA GT: GGT: 10 [IU]/L (ref 0–60)

## 2023-07-04 MED ORDER — SODIUM CHLORIDE 0.9 % IV SOLN: 500.0000 mL | Freq: Once | INTRAVENOUS | Status: DC

## 2023-07-04 NOTE — Progress Notes (Signed)
A/O x 3, gd SR's, VSS, report to RN

## 2023-07-04 NOTE — Progress Notes (Signed)
Pt's states no medical or surgical changes since previsit or office visit. 

## 2023-07-04 NOTE — Patient Instructions (Signed)
REPEAT Colonoscopy in 7 years for screening purposes.  YOU HAD AN ENDOSCOPIC PROCEDURE TODAY AT THE [facility]:   Refer to the procedure report that was given to you for any specific questions about what was found during the examination.  If the procedure report does not answer your questions, please call your gastroenterologist to clarify.  If you requested that your care partner not be given the details of your procedure findings, then the procedure report has been included in a sealed envelope for you to review at your convenience later.  YOU SHOULD EXPECT: Some feelings of bloating in the abdomen. Passage of more gas than usual.  Walking can help get rid of the air that was put into your GI tract during the procedure and reduce the bloating. If you had a lower endoscopy (such as a colonoscopy or flexible sigmoidoscopy) you may notice spotting of blood in your stool or on the toilet paper. If you underwent a bowel prep for your procedure, you may not have a normal bowel movement for a few days.  Please Note:  You might notice some irritation and congestion in your nose or some drainage.  This is from the oxygen used during your procedure.  There is no need for concern and it should clear up in a day or so.  SYMPTOMS TO REPORT IMMEDIATELY:  Following lower endoscopy (colonoscopy or flexible sigmoidoscopy):  Excessive amounts of blood in the stool  Significant tenderness or worsening of abdominal pains  Swelling of the abdomen that is new, acute  Fever of 100F or higher  For urgent or emergent issues, a gastroenterologist can be reached at any hour by calling [phone]. Do not use MyChart messaging for urgent concerns.    DIET:  We do recommend a small meal at first, but then you may proceed to your regular diet.  Drink plenty of fluids but you should avoid alcoholic beverages for 24 hours.  ACTIVITY:  You should plan to take it easy for the rest of today and you should NOT  DRIVE or use heavy machinery until tomorrow (because of the sedation medicines used during the test).    FOLLOW UP: Our staff will call the number listed on your records the next business day following your procedure.  We will call around 7:15- 8:00 am to check on you and address any questions or concerns that you may have regarding the information given to you following your procedure. If we do not reach you, we will leave a message.     If any biopsies were taken you will be contacted by phone or by letter within the next 1-3 weeks.  Please call us at [phone] if you have not heard about the biopsies in 3 weeks.    SIGNATURES/CONFIDENTIALITY: You and/or your care partner have signed paperwork which will be entered into your electronic medical record.  These signatures attest to the fact that that the information above on your After Visit Summary has been reviewed and is understood.  Full responsibility of the confidentiality of this discharge information lies with you and/or your care-partner.

## 2023-07-04 NOTE — Op Note (Signed)
[facility] Patient Name: Rhonda Newton Procedure Date: 07/04/2023 2:24 PM MRN: [id number] Endoscopist: Doristine Locks , MD, [id number] Age: 57 Referring MD:  Date of Birth: 11-12-1965 Gender: Female Account #: [id number] Procedure:                Colonoscopy Indications:              Surveillance: Personal history of colonic polyps                            (unknown histology) on last colonoscopy 5 years ago                           Patient had previously been followed by Dr.                            Edwards/[facility] and states she had prior                            colonoscopy a little over 5 years ago. She believes                            she had at least 1 polyp and was told to follow-up                            in 5 years. Medicines:                Monitored Anesthesia Care Procedure:                Pre-Anesthesia Assessment:                           - Prior to the procedure, a History and Physical                            was performed, and patient medications and                            allergies were reviewed. The patient's tolerance of                            previous anesthesia was also reviewed. The risks                            and benefits of the procedure and the sedation                            options and risks were discussed with the patient.                            All questions were answered, and informed consent                            was obtained. Prior Anticoagulants: The patient has  taken no anticoagulant or antiplatelet agents. ASA                            Grade Assessment: II - A patient with mild systemic                            disease. After reviewing the risks and benefits,                            the patient was deemed in satisfactory condition to                            undergo the procedure.                           After obtaining informed consent, the colonoscope                             was passed under direct vision. Throughout the                            procedure, the patient's blood pressure, pulse, and                            oxygen saturations were monitored continuously. The                            Olympus Scope SN: [id number] was introduced through                            the anus and advanced to the the terminal ileum.                            The colonoscopy was performed without difficulty.                            The patient tolerated the procedure well. The                            quality of the bowel preparation was good. The                            terminal ileum, ileocecal valve, appendiceal                            orifice, and rectum were photographed. Scope In: 2:32:08 PM Scope Out: 2:46:51 PM Scope Withdrawal Time: 0 hours 11 minutes 7 seconds  Total Procedure Duration: 0 hours 14 minutes 43 seconds  Findings:                 The perianal and digital rectal examinations were                            normal.  The entire colon appeared normal.                           Non-bleeding internal hemorrhoids were found during                            retroflexion. The hemorrhoids were small.                           The terminal ileum appeared normal. Complications:            No immediate complications. Estimated Blood Loss:     Estimated blood loss: none. Impression:               - The entire examined colon is normal.                           - Non-bleeding internal hemorrhoids.                           - The examined portion of the ileum was normal.                           - No specimens collected. Recommendation:           - Patient has a contact number available for                            emergencies. The signs and symptoms of potential                            delayed complications were discussed with the                            patient. Return to normal activities  tomorrow.                            Written discharge instructions were provided to the                            patient.                           - Resume previous diet.                           - Continue present medications.                           - Repeat colonoscopy in 7 years for screening                            purposes.                           - Return to GI office PRN. Doristine Locks, MD 07/04/2023 2:53:53 PM

## 2023-07-04 NOTE — Progress Notes (Signed)
GASTROENTEROLOGY PROCEDURE H&P NOTE   Primary Care Physician: de [place], Buren Kos, MD    Reason for Procedure:  Colon polyp surveillance  Plan:    Colonoscopy  Patient is appropriate for endoscopic procedure(s) in the ambulatory ([facility]) setting.  The nature of the procedure, as well as the risks, benefits, and alternatives were carefully and thoroughly reviewed with the patient. Ample time for discussion and questions allowed. The patient understood, was satisfied, and agreed to proceed.     HPI: Rhonda Newton is a 57 y.o. female who presents for colonoscopy for ongoing polyp surveillance. No active lower GI symptoms.    Patient had previously been followed by Dr. [facility] and states she had prior colonoscopy a little over 5 years ago. She believes she had at least 1 polyp and was told to follow-up in 5 years.   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 Authorizing Provider  docusate sodium (COLACE) 100 MG capsule Take 100 mg by mouth daily.   Yes [provider]  Magnesium 500 MG TABS  11/07/21  Yes [provider]  SUMAtriptan (IMITREX) 25 MG tablet TAKE 1 TABLET (25 MG TOTAL) BY MOUTH 3 TIMES/DAY AS NEEDED-BETWEEN MEALS & BEDTIME. 06/29/23   de [place], Buren Kos, MD    Current Outpatient Medications  Medication Sig Dispense  Refill   docusate sodium (COLACE) 100 MG capsule Take 100 mg by mouth daily.     Magnesium 500 MG TABS      SUMAtriptan (IMITREX) 25 MG tablet TAKE 1 TABLET (25 MG TOTAL) BY MOUTH 3 TIMES/DAY AS NEEDED-BETWEEN MEALS & BEDTIME. 12 tablet 11   Current Facility-Administered Medications  Medication Dose Route Frequency Provider Last Rate Last Admin   0.9 %  sodium chloride infusion  500 mL Intravenous Once Shulamis Wenberg V, DO        Allergies as of 07/04/2023 - Review Complete 07/04/2023  Allergen Reaction Noted   Benzodiazepines  02/13/2012    Family History  Problem Relation Age of Onset   Barrett's esophagus Mother    Hypertension Mother    Breast cancer Mother 25   Heart disease Father    Breast cancer Sister 60   Heart disease Maternal Grandmother    Heart disease Maternal Grandfather    Congestive Heart Failure Paternal Grandmother    Dementia Paternal Grandmother    Kidney cancer Paternal Grandfather    Breast cancer Maternal Aunt 66   Barrett's esophagus Maternal Aunt    Prostate cancer Maternal Uncle        dx in his mid 2s   Breast cancer Paternal Aunt        dx in her 79s   COPD Paternal Uncle    Melanoma Cousin  mat first cousin; dx 10s-40s   Prostate cancer Cousin        mat first cousin dx in his mid 35s   Breast cancer Other        PGMs sister   Colon cancer Other        PGMs sister   Pancreatic cancer Other        MGFs sister   Esophageal cancer Other        mggm most likely this type of cancer    Social History   Socioeconomic History   Marital status: Divorced    Spouse name: Not on file   Number of children: 1   Years of education: Not on file   Highest education level: Not on file  Occupational History   Occupation: Print production planner  Tobacco Use   Smoking status: Former    Current packs/day: 0.00    Average packs/day: 1 pack/day for 15.0 years (15.0 ttl pk-yrs)    Types: Cigarettes    Quit date: 05/14/2018    Years since quitting:  5.1   Smokeless tobacco: Never  Vaping Use   Vaping status: Never Used  Substance and Sexual Activity   Alcohol use: No   Drug use: Not Currently    Types: Marijuana   Sexual activity: Not Currently    Birth control/protection: None  Other Topics Concern   Not on file  Social History Narrative   Not on file   Social Determinants of Health   Financial Resource Strain: Patient Declined (02/24/2023)   Overall Financial Resource Strain (CARDIA)    Difficulty of Paying Living Expenses: Patient declined  Food Insecurity: Patient Declined (02/24/2023)   Hunger Vital Sign    Worried About Running Out of Food in the Last Year: Patient declined    Ran Out of Food in the Last Year: Patient declined  Transportation Needs: Unknown (02/24/2023)   PRAPARE - Administrator, Civil Service (Medical): Patient declined    Lack of Transportation (Non-Medical): No  Physical Activity: Insufficiently Active (02/24/2023)   Exercise Vital Sign    Days of Exercise per Week: 5 days    Minutes of Exercise per Session: 20 min  Stress: No Stress Concern Present (02/24/2023)   [employer] of Occupational Health - Occupational Stress Questionnaire    Feeling of Stress : Only a little  Social Connections: Unknown (02/24/2023)   Social Connection and Isolation Panel [NHANES]    Frequency of Communication with Friends and Family: Patient declined    Frequency of Social Gatherings with Friends and Family: Patient declined    Attends Religious Services: Patient declined    Database administrator or Organizations: Patient declined    Attends Engineer, structural: Not on file    Marital Status: Patient declined  Intimate Partner Violence: Not on file    Physical Exam: Vital signs in last 24 hours: @BP  117/77   Pulse 70   Temp (!) 97.5 F (36.4 C)   Ht 5\' 8"  (1.727 m)   Wt 183 lb (83 kg)   LMP 02/02/2012   SpO2 98%   BMI 27.83 kg/m  GEN: NAD EYE: Sclerae anicteric ENT: MMM CV:  Non-tachycardic Pulm: CTA b/l GI: Soft, NT/ND NEURO:  Alert & Oriented x 3   Doristine Locks, DO [facility]   07/04/2023 2:24 PM

## 2023-07-05 ENCOUNTER — Telehealth: Payer: Self-pay

## 2023-07-05 NOTE — Telephone Encounter (Signed)
  Follow up Call-     07/04/2023    1:38 PM  Call back number  Post procedure Call Back phone  # [phone]  Permission to leave phone message Yes     Patient questions:  Do you have a fever, pain , or abdominal swelling? No. Pain Score  0 *  Have you tolerated food without any problems? Yes.    Have you been able to return to your normal activities? Yes.    Do you have any questions about your discharge instructions: Diet   No. Medications  No. Follow up visit  No.  Do you have questions or concerns about your Care? No.  Actions: * If pain score is 4 or above: No action needed, pain <4.

## 2023-07-10 DIAGNOSIS — Z1231 Encounter for screening mammogram for malignant neoplasm of breast: Secondary | ICD-10-CM | POA: Diagnosis not present

## 2023-07-10 LAB — HM MAMMOGRAPHY

## 2023-07-12 ENCOUNTER — Encounter: Payer: Self-pay | Admitting: Family Medicine

## 2023-07-27 ENCOUNTER — Ambulatory Visit (HOSPITAL_BASED_OUTPATIENT_CLINIC_OR_DEPARTMENT_OTHER): Payer: BC Managed Care – PPO | Admitting: Cardiology

## 2023-07-27 ENCOUNTER — Encounter (HOSPITAL_BASED_OUTPATIENT_CLINIC_OR_DEPARTMENT_OTHER): Payer: Self-pay | Admitting: Cardiology

## 2023-07-27 VITALS — BP 114/72 | HR 72 | Ht 68.0 in | Wt 186.5 lb

## 2023-07-27 DIAGNOSIS — R42 Dizziness and giddiness: Secondary | ICD-10-CM

## 2023-07-27 DIAGNOSIS — R5382 Chronic fatigue, unspecified: Secondary | ICD-10-CM | POA: Diagnosis not present

## 2023-07-27 DIAGNOSIS — R6889 Other general symptoms and signs: Secondary | ICD-10-CM | POA: Diagnosis not present

## 2023-07-27 DIAGNOSIS — Z7189 Other specified counseling: Secondary | ICD-10-CM | POA: Diagnosis not present

## 2023-07-27 NOTE — Patient Instructions (Signed)
Medication Instructions:  Your physician recommends that you continue on your current medications as directed. Please refer to the Current Medication list given to you today.   Labwork: NONE  Testing/Procedures: Your physician has requested that you have an exercise tolerance test. For further information please visit [email or web address] Please also follow instruction sheet, as given.  Follow-Up: TO BE DETERMINED BASED ON ETT   Any Other Special Instructions Will Be Listed Below (If Applicable).

## 2023-07-27 NOTE — Progress Notes (Signed)
Cardiology Office Note:  .   Date:  07/28/2023  ID:  Rhonda Newton, DOB [birth year], MRN [id number] PCP: de Peru, Raymond J, MD   HeartCare Providers Cardiologist:  Jodelle Red, MD {  History of Present Illness: .   Rhonda Newton is a 57 y.o. female with PMH esophageal cancer, episodic lightheadedness. I met her 08/02/22 for evaluation of dizziness.  Today: Notices extreme fatigue with minimal activity, such as washing her hair, doing minimal chores. Dizziness is unchanged. No chest pain, no severe SOB with these episodes. No syncope. This started within the last year. It's every time she exerts herself, even minimally. Most strenuous activity she has been able to do is probably vacuuming. Has trouble even making the bed. Discussed treadmill stress test today, she feels that she could walk on the treadmill long enough to get information about her BP/HR and symptoms.   Used to be able to push herself hard with activity, probably last able to do this 1.5-2 years ago. Now can do minimal activity, has to sit for several minutes before she can resume activity.   ROS: Denies chest pain, shortness of breath at rest. No PND, orthopnea, LE edema or unexpected weight gain. No syncope or palpitations. ROS otherwise negative except as noted.   Studies Reviewed: Marland Kitchen    EKG:  EKG Interpretation Date/Time:  Thursday July 27 2023 16:34:13 EST Ventricular Rate:  67 PR Interval:  166 QRS Duration:  82 QT Interval:  410 QTC Calculation: 433 R Axis:   54  Text Interpretation: Normal sinus rhythm Low voltage QRS Confirmed by Jodelle Red [phone]) on 07/28/2023 7:23:24 PM    Physical Exam:   VS:  BP 114/72   Pulse 72   Ht 5\' 8"  (1.727 m)   Wt 186 lb 8 oz (84.6 kg)   LMP 02/02/2012   SpO2 97%   BMI 28.36 kg/m    Wt Readings from Last 3 Encounters:  07/27/23 186 lb 8 oz (84.6 kg)  07/04/23 183 lb (83 kg)  07/03/23 183 lb 6.4 oz (83.2 kg)    GEN: Well nourished,  well developed in no acute distress HEENT: Normal, moist mucous membranes NECK: No JVD CARDIAC: regular rhythm, normal S1 and S2, no rubs or gallops. No murmur. VASCULAR: Radial and DP pulses 2+ bilaterally. No carotid bruits RESPIRATORY:  Clear to auscultation without rales, wheezing or rhonchi  ABDOMEN: Soft, non-tender, non-distended MUSCULOSKELETAL:  Ambulates independently SKIN: Warm and dry, no edema NEUROLOGIC:  Alert and oriented x 3. No focal neuro deficits noted. PSYCHIATRIC:  Normal affect    ASSESSMENT AND PLAN: .    Dizziness/lightheadedness Vision changes History of esophageal cancer -discussed potential etiologies at length today. She had her vagus nerve cut with surgery for her esophageal cancer, discussed that although she is not orthostatic this may play a role in her autonomic reflex -ECG normal -orthostatics negative -low suspicion that this is cardiac in etiology. Discussed that if symptoms worsen, can order echo to exclude structural/functional abnormalities. She will contact me if she develops worsening changes  Exertional intolerance -we discussed at length today. After shared decision making, will proceed with exercise treadmill stress test.  Informed Consent   Shared Decision Making/Informed Consent The risks [chest pain, shortness of breath, cardiac arrhythmias, dizziness, blood pressure fluctuations, myocardial infarction, stroke/transient ischemic attack, and life-threatening complications (estimated to be 1 in 10,000)], benefits (risk stratification, diagnosing coronary artery disease, treatment guidance) and alternatives of an exercise tolerance test were discussed in detail with  Ms. Lesky and she agrees to proceed.      CV risk counseling and prevention -recommend heart healthy/Mediterranean diet, with whole grains, fruits, vegetable, fish, lean meats, nuts, and olive oil. Limit salt. -recommend moderate walking, 3-5 times/week for 30-50 minutes each  session. Aim for at least 150 minutes.week. Goal should be pace of 3 miles/hours, or walking 1.5 miles in 30 minutes -recommend avoidance of tobacco products. Avoid excess alcohol. -ASCVD risk score: The 10-year ASCVD risk score (Arnett DK, et al., 2019) is: 1.7%   Values used to calculate the score:     Age: 4 years     Sex: Female     Is Non-Hispanic African American: No     Diabetic: No     Tobacco smoker: No     Systolic Blood Pressure: 114 mmHg     Is BP treated: No     HDL Cholesterol: 48 mg/dL     Total Cholesterol: 176 mg/dL    Dispo: to be determined based on results of stress test  Signed, Jodelle Red, MD   Jodelle Red, MD, PhD, [facility] [place]  [facility]  [place]  Heart & Vascular at [facility] at [facility] [address], [address] [place], [place] [address] [phone]

## 2023-07-28 ENCOUNTER — Encounter (HOSPITAL_BASED_OUTPATIENT_CLINIC_OR_DEPARTMENT_OTHER): Payer: Self-pay | Admitting: Cardiology

## 2023-08-03 DIAGNOSIS — E041 Nontoxic single thyroid nodule: Secondary | ICD-10-CM | POA: Diagnosis not present

## 2023-08-03 DIAGNOSIS — K8689 Other specified diseases of pancreas: Secondary | ICD-10-CM | POA: Diagnosis not present

## 2023-08-03 DIAGNOSIS — R5383 Other fatigue: Secondary | ICD-10-CM | POA: Diagnosis not present

## 2023-08-03 DIAGNOSIS — R918 Other nonspecific abnormal finding of lung field: Secondary | ICD-10-CM | POA: Diagnosis not present

## 2023-08-03 DIAGNOSIS — Z8616 Personal history of COVID-19: Secondary | ICD-10-CM | POA: Diagnosis not present

## 2023-08-03 DIAGNOSIS — K429 Umbilical hernia without obstruction or gangrene: Secondary | ICD-10-CM | POA: Diagnosis not present

## 2023-08-03 DIAGNOSIS — Z87891 Personal history of nicotine dependence: Secondary | ICD-10-CM | POA: Diagnosis not present

## 2023-08-03 DIAGNOSIS — C155 Malignant neoplasm of lower third of esophagus: Secondary | ICD-10-CM | POA: Diagnosis not present

## 2023-08-03 DIAGNOSIS — R42 Dizziness and giddiness: Secondary | ICD-10-CM | POA: Diagnosis not present

## 2023-08-15 ENCOUNTER — Ambulatory Visit: Payer: BC Managed Care – PPO | Attending: Cardiology

## 2023-08-15 DIAGNOSIS — R6889 Other general symptoms and signs: Secondary | ICD-10-CM

## 2023-08-15 DIAGNOSIS — R42 Dizziness and giddiness: Secondary | ICD-10-CM | POA: Diagnosis not present

## 2023-08-15 DIAGNOSIS — R5382 Chronic fatigue, unspecified: Secondary | ICD-10-CM

## 2023-08-16 LAB — EXERCISE TOLERANCE TEST
Angina Index: 0
Duke Treadmill Score: 9
Estimated workload: 10.1
Exercise duration (min): 9 min
Exercise duration (sec): 0 s
MPHR: 164 {beats}/min
Peak HR: 148 {beats}/min
Percent HR: 90 %
RPE: 15
Rest HR: 70 {beats}/min
ST Depression (mm): 0 mm

## 2023-10-02 ENCOUNTER — Encounter (HOSPITAL_BASED_OUTPATIENT_CLINIC_OR_DEPARTMENT_OTHER): Payer: Self-pay

## 2023-10-04 ENCOUNTER — Encounter (HOSPITAL_BASED_OUTPATIENT_CLINIC_OR_DEPARTMENT_OTHER): Payer: Self-pay

## 2023-10-10 ENCOUNTER — Encounter (HOSPITAL_BASED_OUTPATIENT_CLINIC_OR_DEPARTMENT_OTHER): Payer: Self-pay

## 2023-11-02 ENCOUNTER — Ambulatory Visit (HOSPITAL_BASED_OUTPATIENT_CLINIC_OR_DEPARTMENT_OTHER): Payer: BC Managed Care – PPO | Admitting: Family Medicine

## 2023-12-13 ENCOUNTER — Encounter (HOSPITAL_BASED_OUTPATIENT_CLINIC_OR_DEPARTMENT_OTHER): Payer: Self-pay | Admitting: Family Medicine

## 2023-12-13 ENCOUNTER — Ambulatory Visit (HOSPITAL_BASED_OUTPATIENT_CLINIC_OR_DEPARTMENT_OTHER): Payer: BC Managed Care – PPO | Admitting: Family Medicine

## 2023-12-13 VITALS — BP 128/83 | HR 61 | Ht 68.5 in | Wt 189.3 lb

## 2023-12-13 DIAGNOSIS — Z0000 Encounter for general adult medical examination without abnormal findings: Secondary | ICD-10-CM

## 2023-12-13 DIAGNOSIS — R748 Abnormal levels of other serum enzymes: Secondary | ICD-10-CM | POA: Insufficient documentation

## 2023-12-13 DIAGNOSIS — R42 Dizziness and giddiness: Secondary | ICD-10-CM | POA: Diagnosis not present

## 2023-12-13 NOTE — Assessment & Plan Note (Signed)
 Patient did have evaluation with cardiology.  Had exercise stress test completed which was normal.  She does continue to have symptoms, primarily notes this when bending over or if she is walking and moving more quickly.  She does have follow-up appointment with neurology next month and does plan to discuss with them further as well

## 2023-12-13 NOTE — Patient Instructions (Signed)
  Medication Instructions:  Your physician recommends that you continue on your current medications as directed. Please refer to the Current Medication list given to you today. --If you need a refill on any your medications before your next appointment, please call your pharmacy first. If no refills are authorized on file call the office.-- Lab Work: Your physician has recommended that you have lab work today: today / 1 week before next visit  If you have labs (blood work) drawn today and your tests are completely normal, you will receive your results via MyChart message OR a phone call from our staff.  Please ensure you check your voicemail in the event that you authorized detailed messages to be left on a delegated number. If you have any lab test that is abnormal or we need to change your treatment, we will call you to review the results.  Follow-Up: Your next appointment:   Your physician recommends that you schedule a follow-up appointment in: 7 month physical with Dr. de Peru  You will receive a text message or e-mail with a link to a survey about your care and experience with [place] today! We would greatly appreciate your feedback!   Thanks for letting us be apart of your health journey!!  Primary Care and Sports Medicine   Dr. Ceasar Mons Peru   We encourage you to activate your patient portal called "MyChart".  Sign up information is provided on this After Visit Summary.  MyChart is used to connect with patients for Virtual Visits (Telemedicine).  Patients are able to view lab/test results, encounter notes, upcoming appointments, etc.  Non-urgent messages can be sent to your provider as well. To learn more about what you can do with MyChart, please visit --  [email or web address].

## 2023-12-13 NOTE — Progress Notes (Signed)
    Procedures performed today:    None.  Independent interpretation of notes and tests performed by another provider:   None.  Brief History, Exam, Impression, and Recommendations:    BP 128/83 (BP Location: Right Arm, Patient Position: Sitting, Cuff Size: Normal)   Pulse 61   Ht 5' 8.5" (1.74 m)   Wt 189 lb 4.8 oz (85.9 kg)   LMP 02/02/2012   SpO2 99%   BMI 28.36 kg/m   Elevated alkaline phosphatase level Assessment & Plan: On last labs, alkaline phosphatase level was still above normal range, however had improved slightly.  GGT was normal.  Given mild degree of elevation, recommendation was to proceed with retesting in the future.  We can proceed with this today for monitoring  Orders: -     Hepatic function panel  Dizziness Assessment & Plan: Patient did have evaluation with cardiology.  Had exercise stress test completed which was normal.  She does continue to have symptoms, primarily notes this when bending over or if she is walking and moving more quickly.  She does have follow-up appointment with neurology next month and does plan to discuss with them further as well   Wellness examination -     CBC with Differential/Platelet; Future -     Comprehensive metabolic panel with GFR; Future -     Hemoglobin A1c; Future -     Lipid panel; Future -     TSH Rfx on Abnormal to Free T4; Future  Return in about 7 months (around 07/14/2024) for CPE with fasting labs 1 week prior.   ___________________________________________ Travis Mastel de [place], MD, ABFM, CAQSM Primary Care and Sports Medicine [facility]

## 2023-12-13 NOTE — Assessment & Plan Note (Signed)
 On last labs, alkaline phosphatase level was still above normal range, however had improved slightly.  GGT was normal.  Given mild degree of elevation, recommendation was to proceed with retesting in the future.  We can proceed with this today for monitoring

## 2023-12-14 LAB — HEPATIC FUNCTION PANEL
ALT: 17 IU/L (ref 0–32)
AST: 19 IU/L (ref 0–40)
Albumin: 4.4 g/dL (ref 3.8–4.9)
Alkaline Phosphatase: 132 IU/L — ABNORMAL HIGH (ref 44–121)
Bilirubin Total: 0.5 mg/dL (ref 0.0–1.2)
Bilirubin, Direct: 0.14 mg/dL (ref 0.00–0.40)
Total Protein: 7.2 g/dL (ref 6.0–8.5)

## 2023-12-18 ENCOUNTER — Encounter (HOSPITAL_BASED_OUTPATIENT_CLINIC_OR_DEPARTMENT_OTHER): Payer: Self-pay | Admitting: Family Medicine

## 2024-01-30 NOTE — Patient Instructions (Incomplete)

## 2024-01-30 NOTE — Progress Notes (Deleted)
 No chief complaint on file.   HISTORY OF PRESENT ILLNESS:  01/30/24 ALL:  Rhonda Newton returns for follow up for migraine with aura and dizziness. She was last seen 01/2023 and doing fairly well. Migraines well managed on magnesium  supplements.   Dizziness continues. She reports worsening with bending over or if moving/walking quickly. Cardiology eval reportedly normal. ETT showed good exercise capacity. Peak METS 10.1. Peak HR 148bpm (90% max age-predicted heart rate). BP was elevated, max 197/77. NO ST elevation.   FNA of thyroid  nodule benign 04/2023. Planned follow up US  04/2024. She is following closely with PCP for elevated Alk Phos levels. Last eval showed level stable. GGT normal. She continues close follow up the oncology s/p Stage III esophageal cancer. Last visit 07/2023. CT stable. 1 year follow up advised.   Estrogen?  01/30/2023 ALL:  Rhonda Newton is a 58 y.o. female here today for follow up for migraine with aura and dizziness. She was last seen by Dr Billy Bue 07/2022 and reported an isolated event of vision loss in left eye that was not typical for previous aura symptoms. MRI and CTA head and neck unremarkable with exception of thyroid  nodule.   Headaches are stable. Occasional right eye aura described as grey blobs and bilateral halo. Rx glasses have helped reduce frequency of headaches. She feels magnesium  has helped as well. Sumatriptan  works well for abortive therapy. She may have to take 1-2 per month on average.   Dizziness remains constant. Severity waxes and wanes. PT was helpful. Home exercises can help with dizziness worsens. She feels stress may contribute. She saw PCP who recommended escitalopram  but she was hesitant of side effects. She was seen in ER last night following a dog bite and she was encouraged to increase water intake.   Cardiology evaluation was unremarkable. She had US  thyroid  showing 1.8cm nodule in left lobe. FNA being considered. She has follow up with PCP  this week.    HISTORY (copied from Dr Margrette Shield previous note)  Brief HPI: 58 year old female with a history of esophageal carcinoma (2019, s/p resection) who follows in clinic for migraine with aura and vertigo. MRI brain 11/09/21 was unremarkable.   At her last visit she was continued on magnesium  glycinate for migraine prevention and Imitrex  25 mg PRN for rescue.   Interval History: Last week she had an episode of vision loss in her left eye which she describes as her vision going black. She was working on a spreadsheet and leaned back, then vision went black with a small pinhole of light in the center of her vision. This lasted for less than 2 minutes before it resolved. States this was different from her typical migraine aura.   She continues to have dizziness, which she feels like has worsened in the past few weeks. Dizziness is described as a sensation that she is rolling or moving. It improves with movement. It is constant but fluctuates in severity. States she almost passes out when she bends forward and stands back up. Had another episode where she felt lightheaded after urinating. Saw her PCP who noted a negative Dix-Hallpike.    She has not had any migraine headaches, but continues to have intermittent visual auras ~2 times per week. They are described as spreading scotomas which last from 10-45 minutes at a time.    Recent LDL 93, A1c 5.7.   Current Headache Regimen: Preventative: magnesium  Abortive: Imitrex  25 mg PRN     Prior Therapies  Imitrex  25 mg PRN Magnesium    REVIEW OF SYSTEMS: Out of a complete 14 system review of symptoms, the patient complains only of the following symptoms, headaches, aura, dizziness, anxiety and all other reviewed systems are negative.   ALLERGIES: Allergies  Allergen Reactions   Benzodiazepines     Clouding of cognition and disinhibition     HOME MEDICATIONS: Outpatient Medications Prior to Visit   Medication Sig Dispense Refill   docusate sodium (COLACE) 100 MG capsule Take 100 mg by mouth daily.     Magnesium  500 MG TABS      SUMAtriptan  (IMITREX ) 25 MG tablet TAKE 1 TABLET (25 MG TOTAL) BY MOUTH 3 TIMES/DAY AS NEEDED-BETWEEN MEALS & BEDTIME. 12 tablet 11   No facility-administered medications prior to visit.     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   Hypertriglyceridemia 12/28/2021   Thyroid  nodule incidentally noted on imaging study 10/27/2022     PAST SURGICAL HISTORY: Past Surgical History:  Procedure Laterality Date   cataracts Bilateral    age 71   ESOPHAGECTOMY  2019   at [facility]   INTRAUTERINE DEVICE INSERTION     [employer]   IUD REMOVAL       FAMILY HISTORY: Family History  Problem Relation Age of Onset   Barrett's esophagus Mother    Hypertension Mother    Breast cancer Mother 41   Heart disease Father    Breast cancer Sister 36   Heart disease Maternal Grandmother    Heart disease Maternal Grandfather    Congestive Heart Failure Paternal Grandmother    Dementia Paternal Grandmother    Kidney cancer Paternal Grandfather    Breast cancer Maternal Aunt 85   Barrett's esophagus Maternal Aunt    Prostate cancer Maternal Uncle        dx in his mid 68s   Breast cancer Paternal Aunt        dx in her 20s   COPD Paternal Uncle    Melanoma Cousin        mat first cousin; dx 38s-40s   Prostate cancer Cousin        mat first cousin dx in his mid 74s   Breast cancer Other        PGMs sister   Colon cancer Other        PGMs sister   Pancreatic cancer Other        MGFs sister   Esophageal cancer Other        mggm most likely this type of cancer     SOCIAL  HISTORY: Social History   Socioeconomic History   Marital status: Divorced    Spouse name: Not on file   Number of children: 1   Years of education: Not on file   Highest education level: Not on file  Occupational History   Occupation: Print production planner  Tobacco Use   Smoking status: Former    Current packs/day: 0.00    Average packs/day: 1 pack/day for 15.0 years (15.0 ttl pk-yrs)    Types: Cigarettes    Quit date: 05/14/2018    Years since quitting: 5.7   Smokeless tobacco: Never  Vaping Use   Vaping status: Never Used  Substance and Sexual Activity   Alcohol use: No   Drug use: Not Currently    Types: Marijuana   Sexual activity: Not Currently    Birth control/protection: None  Other Topics Concern   Not on file  Social History Narrative   Not on file   Social Drivers of Health   Financial Resource Strain: Patient Declined (12/09/2023)   Overall Financial Resource Strain (CARDIA)    Difficulty of Paying Living Expenses: Patient declined  Food Insecurity: Patient Declined (12/09/2023)   Hunger Vital Sign    Worried About Running Out of Food in the Last Year: Patient declined    Ran Out of Food in the Last Year: Patient declined  Transportation Needs: Patient Declined (12/09/2023)   PRAPARE - Administrator, Civil Service (Medical): Patient declined    Lack of Transportation (Non-Medical): Patient declined  Physical Activity: Insufficiently Active (12/09/2023)   Exercise Vital Sign    Days of Exercise per Week: 2 days    Minutes of Exercise per Session: 10 min  Stress: No Stress Concern Present (12/09/2023)   [employer] of Occupational Health - Occupational Stress Questionnaire    Feeling of Stress : Only a little  Social Connections: Unknown (12/09/2023)   Social Connection and Isolation Panel [NHANES]    Frequency of Communication with Friends and Family: Patient declined    Frequency of Social Gatherings with Friends and Family: Patient declined     Attends Religious Services: Patient declined    Database administrator or Organizations: Patient declined    Attends Engineer, structural: Not on file    Marital Status: Patient declined  Intimate Partner Violence: Not on file     PHYSICAL EXAM  There were no vitals filed for this visit.  There is no height or weight on file to calculate BMI.  Generalized: Well developed, in no acute distress  Cardiology: normal rate and rhythm, no murmur auscultated  Respiratory: clear to auscultation bilaterally    Neurological examination  Mentation: Alert oriented to time, place, history taking. Follows all commands speech and language fluent Cranial nerve II-XII: Pupils were equal round reactive to light. Extraocular movements were full, visual field were full on confrontational test. Facial sensation and strength were normal. Head turning and shoulder shrug  were normal and symmetric. Motor: The motor testing reveals 5 over 5 strength of all 4 extremities. Good symmetric motor tone is noted throughout.  Gait and station: Gait is normal.    DIAGNOSTIC DATA (LABS, IMAGING, TESTING) - I reviewed patient records, labs, notes, testing and imaging myself where available.  Lab Results  Component Value Date   WBC 6.2 06/26/2023   HGB 13.4 06/26/2023   HCT 41.6 06/26/2023   MCV 90 06/26/2023   PLT 317 06/26/2023      Component Value Date/Time   NA 139 06/26/2023 0838   K 4.1 06/26/2023 0838   CL 103 06/26/2023 0838   CO2 22 06/26/2023 0838   GLUCOSE 92 06/26/2023 0838   GLUCOSE 92 09/11/2018 1310   BUN 13 06/26/2023 0838   CREATININE 0.89 06/26/2023 0838   CREATININE 0.86 07/03/2018 0948   CALCIUM 9.2 06/26/2023 0838   PROT 7.2 12/13/2023 1349   ALBUMIN 4.4 12/13/2023 1349   AST 19 12/13/2023 1349   AST 16 07/03/2018 0948   ALT 17 12/13/2023 1349   ALT 17 07/03/2018 0948   ALKPHOS 132 (H) 12/13/2023 1349  BILITOT 0.5 12/13/2023 1349   BILITOT 0.7 07/03/2018 0948    GFRNONAA >60 09/11/2018 1310   GFRNONAA >60 07/03/2018 0948   GFRAA >60 09/11/2018 1310   GFRAA >60 07/03/2018 0948   Lab Results  Component Value Date   CHOL 176 06/26/2023   HDL 48 06/26/2023   LDLCALC 102 (H) 06/26/2023   TRIG 145 06/26/2023   CHOLHDL 3.7 06/26/2023   Lab Results  Component Value Date   HGBA1C 5.7 (H) 06/26/2023   No results found for: "VITAMINB12" Lab Results  Component Value Date   TSH 0.639 06/26/2023        No data to display               No data to display           ASSESSMENT AND PLAN  58 y.o. year old female  has a past medical history of Anxiety and depression, Barrett's esophagus (05/18/2018), Cataract (2002), Depression, Family history of breast cancer, Family history of colon cancer, Family history of melanoma, Family history of prostate cancer, GE junction carcinoma (HCC) (05/18/2018), Hypertriglyceridemia (12/28/2021), and Thyroid  nodule incidentally noted on imaging study (10/27/2022). here with    No diagnosis found.  Rhonda Newton reports headaches and dizziness are stable. She will continue magnesium  OTC. We have reviewed possible triggers. She will continue focusing on healthy lifestyle habits. She will follow up with PCP as directed. She will return to see me in 1 year, sooner if needed. She verbalizes understanding and agreement with this plan.   No orders of the defined types were placed in this encounter.    No orders of the defined types were placed in this encounter.    Terrilyn Fick, MSN, FNP-C 01/30/2024, 8:48 AM  [facility] [address], [address] [place], [place] [address] [phone]

## 2024-01-31 ENCOUNTER — Ambulatory Visit: Payer: BC Managed Care – PPO | Admitting: Family Medicine

## 2024-01-31 ENCOUNTER — Encounter (HOSPITAL_BASED_OUTPATIENT_CLINIC_OR_DEPARTMENT_OTHER): Payer: Self-pay | Admitting: Family Medicine

## 2024-01-31 DIAGNOSIS — R42 Dizziness and giddiness: Secondary | ICD-10-CM

## 2024-02-02 ENCOUNTER — Telehealth: Payer: Self-pay | Admitting: Cardiology

## 2024-02-02 NOTE — Telephone Encounter (Signed)
 Patient is requesting to switch from Dr. Veryl Gottron to Dr. Avanell Bob. Please advise.

## 2024-02-06 NOTE — Telephone Encounter (Signed)
 Please see new mychart sent by pt and advise.

## 2024-02-14 ENCOUNTER — Encounter (INDEPENDENT_AMBULATORY_CARE_PROVIDER_SITE_OTHER): Payer: Self-pay | Admitting: Otolaryngology

## 2024-02-20 NOTE — Progress Notes (Signed)
 Cardiology Office Note:    Date:  02/27/2024   ID:  Lunden Stieber, DOB 03-14-1966, MRN [id number]  PCP:  de [place], Raymond J, MD  Cardiologist:  Ola Berger, MD     Referring MD: de [place], Alonza Jansky, MD   Chief Complaint: follow-up of lightheadedness/ dizziness  History of Present Illness:    Rhonda Newton is a 58 y.o. female with a history of intermittent lightheadedness/ dizziness, hyperlipidemia, Barrett's esophagus, esophageal cancer, thyroid  nodule, and anxiety/ depression who is followed by Dr. Veryl Gottron and presents today for routine follow-up.  Patient was previously followed by Dr. Veryl Gottron but now follows with Dr. Avanell Bob. She was initially referred to Cardiology in 07/2022 for further evaluation of  intermittent dizziness/headedness.  She does have a history of vertigo in the past but stated this felt different.  Orthostats were negative in the office.  However, she did previously have her vagus nerve cut during surgery for esophageal cancer so this was felt to possibly be playing a role in her autonomic reflux.  There was low suspicion that this was cardiac in nature.  Plan was for Echo if symptoms worsen. She has been seen by Neurology for this in the past. Head/ neck CTA in 07/2022 and brain MRI in 08/2022 were unremarkable.  She was last seen by Dr. Veryl Gottron in 07/2023 at which time she noted extreme fatigue with minimal activity such as washing her hair and doing minimal chores.  She continued to have intermittent dizziness but this was unchanged.  She denied any chest pain or severe shortness of breath.  ETT was ordered for further evaluation of exercise intolerance was low risk but no evidence of ischemia.  Patient presents today for follow-up.  She continues to have basically constant lightheadedness/dizziness which she states is like being drunk (she can't walk straight( as well as intermittent visual disturbances that she describes as tunnel vision, double vision, and  intermittently seeing gray blobs.  She does have a history of vertigo but states this feels different.  All of these symptoms started after she had COVID in 2022.  She has seen ophthalmology and neurology for these visual disturbances and states workup was unremarkable.  She was with a sinus infection around Easter of this year and started taking Sudafed for a little bit.  She noticed the dizziness and visual changes resolved with this and so is wondering whether she needs a medication like midodrine to help her symptoms.  Orthostatic vital signs are negative in the office and she does not describe any worsening of her dizziness with position changes but states it is hard to say because she feels dizzy all the time.  She also continues to have extreme fatigue with exertion but this is specifically with activities that require a lot of bending over such as doing yard work.  She does not have any exertional fatigue/intolerance when doing other activities such as walking longer distances.  She denies any chest pain, shortness of breath, orthopnea, PND.  She occasionally has some palpitations and states her Omron BP cuff has noted some tachycardia but she is unsure if these episodes correlate with her worsening periods of dizziness/ vision changes.  No syncope.  EKGs/Labs/Other Studies Reviewed:    The following studies were reviewed:  Exercise tolerance Test 08/15/2023:   Good exercise capacity, achieved 10.1 METS   Peak heart rate 148 bpm (90% max age-predicted heart rate)   Hypertensive response to exercise, peak BP 197/77   No ST deviation was  noted.   Low risk study  EKG:  EKG not ordered today.  Recent Labs: 06/26/2023: BUN 13; Creatinine, Ser 0.89; Hemoglobin 13.4; Platelets 317; Potassium 4.1; Sodium 139; TSH 0.639 12/13/2023: ALT 17  Recent Lipid Panel    Component Value Date/Time   CHOL 176 06/26/2023 0838   TRIG 145 06/26/2023 0838   HDL 48 06/26/2023 0838   CHOLHDL 3.7 06/26/2023  0838   LDLCALC 102 (H) 06/26/2023 0838    Physical Exam:    Vital Signs: BP 116/78   Pulse 68   Ht 5' 8 (1.727 m)   Wt 183 lb (83 kg)   LMP 02/02/2012   SpO2 97%   BMI 27.83 kg/m     Wt Readings from Last 3 Encounters:  02/27/24 183 lb (83 kg)  12/13/23 189 lb 4.8 oz (85.9 kg)  07/27/23 186 lb 8 oz (84.6 kg)     General: 58 y.o. Caucasian female in no acute distress. HEENT: Normocephalic and atraumatic. Sclera clear.  Neck: Supple. No carotid bruits. No JVD. Heart: RRR. Distinct S1 and S2. No murmurs, gallops, or rubs.  Lungs: No increased work of breathing. Clear to ausculation bilaterally. No wheezes, rhonchi, or rales.  Extremities: No lower extremity edema.  Radial pulses 2+ and equal bilaterally. Skin: Warm and dry. Neuro: No focal deficits. Psych: Normal affect. Responds appropriately.   Assessment:    1. Dizziness   2. Exercise intolerance   3. Hyperlipidemia, unspecified hyperlipidemia type     Plan:    Lightheadedness/ Dizziness Patient has a history of intermittent lightheadedness/dizziness as previously not been felt to be cardiac in nature.  Her vagus nerve was previously cut during surgery for esophageal cancer so this was felt to possibly playing a role in her automatic reflux. - She continues to have basically constant lightheadedness/dizziness as well as intermittent episodes of vision changes.  She states she has seen both ophthalmology and neurology and workup was unremarkable. - Orthostatic vital signs negative in the office.  - Dizziness does not sound cardiac but will order a 3-day ZIO monitor as well as an Echo for thoroughness.  Exercise Intolerance She reported exercise intolerance at last visit. ETT in 08/2023 was negative. - She continues to report extreme fatigue with activities that requiring a lot of bending over such as doing yard work. No chest pain or shortness of breath.  - Do not think any additional ischemic evaluation is  necessary at this time given recent negative ETT.  However, will get Echo as above.  Hyperlipidemia Triglycerides previously as high as 375. Last lipid panel in 06/2023: total Cholesterol 176, triglycerides, 145, HDL 48, LDL 102. Triglycerides previously as high as 375. - Labs followed by PCP. - Did not have time to discuss in detail today given acute concerns above.  -   Disposition: Follow up in 3 months.   Signed, Casimer Clear, PA-C  02/27/2024 12:11 PM    [facility]

## 2024-02-27 ENCOUNTER — Encounter: Payer: Self-pay | Admitting: Student

## 2024-02-27 ENCOUNTER — Ambulatory Visit: Attending: Student | Admitting: Student

## 2024-02-27 ENCOUNTER — Ambulatory Visit

## 2024-02-27 VITALS — BP 116/78 | HR 68 | Ht 68.0 in | Wt 183.0 lb

## 2024-02-27 DIAGNOSIS — E785 Hyperlipidemia, unspecified: Secondary | ICD-10-CM | POA: Diagnosis not present

## 2024-02-27 DIAGNOSIS — R42 Dizziness and giddiness: Secondary | ICD-10-CM | POA: Diagnosis not present

## 2024-02-27 DIAGNOSIS — R6889 Other general symptoms and signs: Secondary | ICD-10-CM | POA: Diagnosis not present

## 2024-02-27 NOTE — Progress Notes (Unsigned)
 Enrolled for [employer] to mail a ZIO XT long term holter monitor to the patients address on file.   Dr. B.Christopher to read.

## 2024-02-27 NOTE — Patient Instructions (Signed)
 Medication Instructions:  No changes *If you need a refill on your cardiac medications before your next appointment, please call your pharmacy*   Testing/Procedures: Your physician has requested that you have an echocardiogram. Echocardiography is a painless test that uses sound waves to create images of your heart. It provides your doctor with information about the size and shape of your heart and how well your heart's chambers and valves are working. This procedure takes approximately one hour. There are no restrictions for this procedure. Please do NOT wear cologne, perfume, aftershave, or lotions (deodorant is allowed). Please arrive 15 minutes prior to your appointment time.  Please note: We ask at that you not bring children with you during ultrasound (echo/ vascular) testing. Due to room size and safety concerns, children are not allowed in the ultrasound rooms during exams. Our front office staff cannot provide observation of children in our lobby area while testing is being conducted. An adult accompanying a patient to their appointment will only be allowed in the ultrasound room at the discretion of the ultrasound technician under special circumstances. We apologize for any inconvenience.    Your physician has recommended that you wear a 3 DAY ZIO-PATCH monitor. The Zio patch cardiac monitor continuously records heart rhythm data for up to 14 days, this is for patients being evaluated for multiple types heart rhythms. For the first 24 hours post application, please avoid getting the Zio monitor wet in the shower or by excessive sweating during exercise. After that, feel free to carry on with regular activities. Keep soaps and lotions away from the ZIO XT Patch.  This will be mailed to you, please expect 7-10 days to receive.    Applying the monitor   Shave hair from upper left chest.   Hold abrader disc by orange tab.  Rub abrader in 40 strokes over left upper chest as indicated in your  monitor instructions.   Clean area with 4 enclosed alcohol pads .  Use all pads to assure are is cleaned thoroughly.  Let dry.   Apply patch as indicated in monitor instructions.  Patch will be place under collarbone on left side of chest with arrow pointing upward.   Rub patch adhesive wings for 2 minutes.Remove white label marked 1.  Remove white label marked 2.  Rub patch adhesive wings for 2 additional minutes.   While looking in a mirror, press and release button in center of patch.  A small green light will flash 3-4 times .  This will be your only indicator the monitor has been turned on.     Do not shower for the first 24 hours.  You may shower after the first 24 hours.   Press button if you feel a symptom. You will hear a small click.  Record Date, Time and Symptom in the Patient Log Book.   When you are ready to remove patch, follow instructions on last 2 pages of Patient Log Book.  Stick patch monitor onto last page of Patient Log Book.   Place Patient Log Book in [place] box.  Use locking tab on box and tape box closed securely.  The [employer] and [employer] has [employer] on it.  Please place in mailbox as soon as possible.  Your physician should have your test results approximately 7 days after the monitor has been mailed back to [facility].   Call [facility] Customer Care at [phone] if you have questions regarding your ZIO XT patch monitor.  Call them  immediately if you see an orange light blinking on your monitor.   If your monitor falls off in less than 4 days contact our Monitor department at [phone].  If your monitor becomes loose or falls off after 4 days call [employer] at [phone] for suggestions on securing your monitor  Follow-Up: At [facility], you and your health needs are our priority.  As part of our continuing mission to provide you with exceptional heart care, our providers are all part of one team.  This team includes your  primary Cardiologist (physician) and Advanced Practice Providers or APPs (Physician Assistants and Nurse Practitioners) who all work together to provide you with the care you need, when you need it.  Your next appointment:    3-4 months  Provider:   Dr Avanell Bob (transitioning care to Dr Avanell Bob)  We recommend signing up for the patient portal called MyChart.  Sign up information is provided on this After Visit Summary.  MyChart is used to connect with patients for Virtual Visits (Telemedicine).  Patients are able to view lab/test results, encounter notes, upcoming appointments, etc.  Non-urgent messages can be sent to your provider as well.   To learn more about what you can do with MyChart, go to [email or web address].

## 2024-03-11 ENCOUNTER — Ambulatory Visit: Admitting: Physician Assistant

## 2024-04-03 ENCOUNTER — Encounter: Payer: Self-pay | Admitting: Family Medicine

## 2024-04-03 ENCOUNTER — Ambulatory Visit: Admitting: Family Medicine

## 2024-04-03 ENCOUNTER — Ambulatory Visit (HOSPITAL_COMMUNITY)
Admission: RE | Admit: 2024-04-03 | Discharge: 2024-04-03 | Disposition: A | Discharge status: Home / Self Care | Source: Ambulatory Visit | Attending: Internal Medicine | Admitting: Internal Medicine

## 2024-04-03 VITALS — BP 120/75 | HR 77 | Ht 68.5 in | Wt 184.4 lb

## 2024-04-03 DIAGNOSIS — I358 Other nonrheumatic aortic valve disorders: Secondary | ICD-10-CM | POA: Diagnosis not present

## 2024-04-03 DIAGNOSIS — G43109 Migraine with aura, not intractable, without status migrainosus: Secondary | ICD-10-CM | POA: Diagnosis not present

## 2024-04-03 DIAGNOSIS — R42 Dizziness and giddiness: Secondary | ICD-10-CM

## 2024-04-03 DIAGNOSIS — R5383 Other fatigue: Secondary | ICD-10-CM

## 2024-04-03 DIAGNOSIS — H532 Diplopia: Secondary | ICD-10-CM | POA: Diagnosis not present

## 2024-04-03 LAB — ECHOCARDIOGRAM COMPLETE
Area-P 1/2: 4.19 cm2
MV M vel: 4.42 m/s
MV Peak grad: 78.1 mmHg
P 1/2 time: 546 ms
S' Lateral: 3.1 cm

## 2024-04-03 NOTE — Progress Notes (Signed)
 Chief Complaint  Patient presents with   Follow-up    Pt in room 2. alone.Here for migraine follow up. Pt said migraines are going well. Pt said dizziness is constant, vision loss comes and goes. Double vision at times, last about 30 seconds.     HISTORY OF PRESENT ILLNESS:  04/03/24 ALL: Rhonda Newton returns for follow up for migraines with aura. She was last seen by me 01/2023 and felt migraines were stable on magnesium  OTC and sumatriptan  PRN. Since, she reports headaches are well managed. She may have 1-2 per month. Sumatriptan  works well.   She continues to have constant dizziness. Also reports intermittent double vision, vertical and binocular. Seems better when closing left eye. She reports halos in both eye with and without headaches. She continues to have periods of extreme exhaustion when she is bending forward. Able to exercise without difficulty. No weakness or trouble swallowing. No chest pain or shortness of breath. Occasional increased heart rate. Cardiology eval reportedly negative for POTS. Echo and heart monitor reportedly normal. Dizziness and vision abnormalities seemed to clear up for about a week in April 2025 after having a sinus infection treated with decongestants and antihistamines. She is seeing ENT 05/2024. MRI showed right mucous retention cyst.   01/30/2023 ALL:  Rhonda Newton is a 58 y.o. female here today for follow up for migraine with aura and dizziness. She was last seen by Dr Rush 07/2022 and reported an isolated event of vision loss in left eye that was not typical for previous aura symptoms. MRI and CTA head and neck unremarkable with exception of thyroid  nodule.   Headaches are stable. Occasional right eye aura described as grey blobs and bilateral halo. Rx glasses have helped reduce frequency of headaches. She feels magnesium  has helped as well. Sumatriptan  works well for abortive therapy. She may have to take 1-2 per month on average.   Dizziness remains  constant. Severity waxes and wanes. PT was helpful. Home exercises can help with dizziness worsens. She feels stress may contribute. She saw PCP who recommended escitalopram  but she was hesitant of side effects. She was seen in ER last night following a dog bite and she was encouraged to increase water intake.   Cardiology evaluation was unremarkable. She had US  thyroid  showing 1.8cm nodule in left lobe. FNA being considered. She has follow up with PCP this week.   HISTORY (copied from Dr Merna previous note)  Brief HPI: 58 year old female with a history of esophageal carcinoma (2019, s/p resection) who follows in clinic for migraine with aura and vertigo. MRI brain 11/09/21 was unremarkable.   At her last visit she was continued on magnesium  glycinate for migraine prevention and Imitrex  25 mg PRN for rescue.   Interval History: Last week she had an episode of vision loss in her left eye which she describes as her vision going black. She was working on a spreadsheet and leaned back, then vision went black with a small pinhole of light in the center of her vision. This lasted for less than 2 minutes before it resolved. States this was different from her typical migraine aura.   She continues to have dizziness, which she feels like has worsened in the past few weeks. Dizziness is described as a sensation that she is rolling or moving. It improves with movement. It is constant but fluctuates in severity. States she almost passes out when she bends forward and stands back up. Had another episode where she felt lightheaded  after urinating. Saw her PCP who noted a negative Dix-Hallpike.    She has not had any migraine headaches, but continues to have intermittent visual auras ~2 times per week. They are described as spreading scotomas which last from 10-45 minutes at a time.    Recent LDL 93, A1c 5.7.   Current Headache Regimen: Preventative: magnesium  Abortive: Imitrex  25 mg PRN     Prior  Therapies                                  Imitrex  25 mg PRN Magnesium    REVIEW OF SYSTEMS: Out of a complete 14 system review of symptoms, the patient complains only of the following symptoms, headaches, aura, dizziness, anxiety and all other reviewed systems are negative.   ALLERGIES: Allergies  Allergen Reactions   Benzodiazepines     Clouding of cognition and disinhibition     HOME MEDICATIONS: Outpatient Medications Prior to Visit  Medication Sig Dispense Refill   docusate sodium (COLACE) 100 MG capsule Take 100 mg by mouth daily.     Magnesium  500 MG TABS      SUMAtriptan  (IMITREX ) 25 MG tablet TAKE 1 TABLET (25 MG TOTAL) BY MOUTH 3 TIMES/DAY AS NEEDED-BETWEEN MEALS & BEDTIME. 12 tablet 11   No facility-administered medications prior to visit.     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   Hypertriglyceridemia 12/28/2021   Thyroid  nodule incidentally noted on imaging study 10/27/2022     PAST SURGICAL HISTORY: Past Surgical History:  Procedure Laterality Date   cataracts Bilateral    age 13   ESOPHAGECTOMY  2019   at [facility]   INTRAUTERINE DEVICE INSERTION     Paraguard   IUD REMOVAL       FAMILY HISTORY: Family History  Problem Relation Age of Onset   Barrett's esophagus Mother    Hypertension Mother    Breast cancer Mother 61   Heart disease Father    Breast cancer Sister 43   Heart disease Maternal Grandmother    Heart disease Maternal Grandfather    Congestive Heart Failure Paternal Grandmother    Dementia Paternal Grandmother    Kidney cancer Paternal Grandfather    Breast cancer Maternal Aunt 24   Barrett's esophagus Maternal Aunt    Prostate cancer  Maternal Uncle        dx in his mid 68s   Breast cancer Paternal Aunt        dx in her 14s   COPD Paternal Uncle    Melanoma Cousin        mat first cousin; dx 72s-40s   Prostate cancer Cousin        mat first cousin dx in his mid 21s   Breast cancer Other        PGMs sister   Colon cancer Other        PGMs sister   Pancreatic cancer Other        MGFs sister   Esophageal cancer Other        mggm most likely this type of cancer  SOCIAL HISTORY: Social History   Socioeconomic History   Marital status: Divorced    Spouse name: Not on file   Number of children: 1   Years of education: Not on file   Highest education level: Not on file  Occupational History   Occupation: Print production planner  Tobacco Use   Smoking status: Former    Current packs/day: 0.00    Average packs/day: 1 pack/day for 15.0 years (15.0 ttl pk-yrs)    Types: Cigarettes    Quit date: 05/14/2018    Years since quitting: 5.8   Smokeless tobacco: Never  Vaping Use   Vaping status: Never Used  Substance and Sexual Activity   Alcohol use: No   Drug use: Not Currently    Types: Marijuana   Sexual activity: Not Currently    Birth control/protection: None  Other Topics Concern   Not on file  Social History Narrative   Not on file   Social Drivers of Health   Financial Resource Strain: Patient Declined (12/09/2023)   Overall Financial Resource Strain (CARDIA)    Difficulty of Paying Living Expenses: Patient declined  Food Insecurity: Patient Declined (12/09/2023)   Hunger Vital Sign    Worried About Running Out of Food in the Last Year: Patient declined    Ran Out of Food in the Last Year: Patient declined  Transportation Needs: Patient Declined (12/09/2023)   PRAPARE - Administrator, Civil Service (Medical): Patient declined    Lack of Transportation (Non-Medical): Patient declined  Physical Activity: Insufficiently Active (12/09/2023)   Exercise Vital Sign    Days of Exercise per Week: 2  days    Minutes of Exercise per Session: 10 min  Stress: No Stress Concern Present (12/09/2023)   [employer] of Occupational Health - Occupational Stress Questionnaire    Feeling of Stress : Only a little  Social Connections: Unknown (12/09/2023)   Social Connection and Isolation Panel    Frequency of Communication with Friends and Family: Patient declined    Frequency of Social Gatherings with Friends and Family: Patient declined    Attends Religious Services: Patient declined    Database administrator or Organizations: Patient declined    Attends Banker Meetings: Not on file    Marital Status: Patient declined  Intimate Partner Violence: Not on file     PHYSICAL EXAM  Vitals:   04/03/24 1357  BP: 120/75  Pulse: 77  SpO2: 99%  Weight: 184 lb 6.4 oz (83.6 kg)  Height: 5' 8.5 (1.74 m)    Body mass index is 27.63 kg/m.  Generalized: Well developed, in no acute distress  Cardiology: normal rate and rhythm, no murmur auscultated  Respiratory: clear to auscultation bilaterally    Neurological examination  Mentation: Alert oriented to time, place, history taking. Follows all commands speech and language fluent Cranial nerve II-XII: Pupils were equal round reactive to light. Extraocular movements were full, visual field were full on confrontational test. No nystagmus. Facial sensation and strength were normal. Head turning and shoulder shrug  were normal and symmetric. 30 sec upward gaze normal, no ptosis or reported fatigue  Motor: The motor testing reveals 5 over 5 strength of all 4 extremities. Good symmetric motor tone is noted throughout.  Gait and station: Gait is normal.    DIAGNOSTIC DATA (LABS, IMAGING, TESTING) - I reviewed patient records, labs, notes, testing and imaging myself where available.  Lab Results  Component Value Date   WBC 6.2 06/26/2023  HGB 13.4 06/26/2023   HCT 41.6 06/26/2023   MCV 90 06/26/2023   PLT 317 06/26/2023       Component Value Date/Time   NA 139 06/26/2023 0838   K 4.1 06/26/2023 0838   CL 103 06/26/2023 0838   CO2 22 06/26/2023 0838   GLUCOSE 92 06/26/2023 0838   GLUCOSE 92 09/11/2018 1310   BUN 13 06/26/2023 0838   CREATININE 0.89 06/26/2023 0838   CREATININE 0.86 07/03/2018 0948   CALCIUM 9.2 06/26/2023 0838   PROT 7.2 12/13/2023 1349   ALBUMIN 4.4 12/13/2023 1349   AST 19 12/13/2023 1349   AST 16 07/03/2018 0948   ALT 17 12/13/2023 1349   ALT 17 07/03/2018 0948   ALKPHOS 132 (H) 12/13/2023 1349   BILITOT 0.5 12/13/2023 1349   BILITOT 0.7 07/03/2018 0948   GFRNONAA >60 09/11/2018 1310   GFRNONAA >60 07/03/2018 0948   GFRAA >60 09/11/2018 1310   GFRAA >60 07/03/2018 0948   Lab Results  Component Value Date   CHOL 176 06/26/2023   HDL 48 06/26/2023   LDLCALC 102 (H) 06/26/2023   TRIG 145 06/26/2023   CHOLHDL 3.7 06/26/2023   Lab Results  Component Value Date   HGBA1C 5.7 (H) 06/26/2023   No results found for: [id number] Lab Results  Component Value Date   TSH 0.639 06/26/2023        No data to display               No data to display           ASSESSMENT AND PLAN  58 y.o. year old female  has a past medical history of Anxiety and depression, Barrett's esophagus (05/18/2018), Cataract (2002), Depression, Family history of breast cancer, Family history of colon cancer, Family history of melanoma, Family history of prostate cancer, GE junction carcinoma (HCC) (05/18/2018), Hypertriglyceridemia (12/28/2021), and Thyroid  nodule incidentally noted on imaging study (10/27/2022). here with    Migraine with aura and without status migrainosus, not intractable  Double vision with both eyes open  Other fatigue  Dizziness   Rhonda Newton reports headaches and dizziness are stable. She will continue magnesium  OTC. We have reviewed possible triggers. Discussed possible MG workup. Neuro exam intact. Could consider dysautonomia clinic referral. PT was helpful  and she was encouraged to continue HEP. She will continue focusing on healthy lifestyle habits. She will follow up with PCP as directed. She will return to see me pending visit with ENT. She verbalizes understanding and agreement with this plan.    No orders of the defined types were placed in this encounter.    No orders of the defined types were placed in this encounter.    Greig Forbes, MSN, FNP-C 04/03/2024, 4:28 PM  [facility] [address], [address] [place], [place] [address] [phone]

## 2024-04-03 NOTE — Patient Instructions (Signed)
 Below is our plan:  We will continue sumatriptan  as needed. Consider functional medicine consult and follow up as planned with ENT. Consider dysautonomia clinic referral.   Please make sure you are staying well hydrated. I recommend 50-60 ounces daily. Well balanced diet and regular exercise encouraged. Consistent sleep schedule with 6-8 hours recommended.   Please continue follow up with care team as directed.   Follow up with me pending visit with ENT 05/2024.   You may receive a survey regarding today's visit. I encourage you to leave honest feed back as I do use this information to improve patient care. Thank you for seeing me today!   GENERAL HEADACHE INFORMATION:   Natural supplements: Magnesium  Oxide or Magnesium  Glycinate 500 mg at bed (up to 800 mg daily) Coenzyme Q10 300 mg in AM Vitamin B2- 200 mg twice a day   Add 1 supplement at a time since even natural supplements can have undesirable side effects. You can sometimes buy supplements cheaper (especially Coenzyme Q10) at www.WebmailGuide.co.za or at [facility].  Migraine with aura: There is increased risk for stroke in women with migraine with aura and a contraindication for the combined contraceptive pill for use by women who have migraine with aura. The risk for women with migraine without aura is lower. However other risk factors like smoking are far more likely to increase stroke risk than migraine. There is a recommendation for no smoking and for the use of OCPs without estrogen such as progestogen only pills particularly for women with migraine with aura.SABRA People who have migraine headaches with auras may be 3 times more likely to have a stroke caused by a blood clot, compared to migraine patients who don't see auras. Women who take hormone-replacement therapy may be 30 percent more likely to suffer a clot-based stroke than women not taking medication containing estrogen. Other risk factors like smoking and high blood pressure may be  much  more important.    Vitamins and herbs that show potential:   Magnesium : Magnesium  (250 mg twice a day or 500 mg at bed) has a relaxant effect on smooth muscles such as blood vessels. Individuals suffering from frequent or daily headache usually have low magnesium  levels which can be increase with daily supplementation of 400-750 mg. Three trials found 40-90% average headache reduction  when used as a preventative. Magnesium  may help with headaches are aura, the best evidence for magnesium  is for migraine with aura is its thought to stop the cortical spreading depression we believe is the pathophysiology of migraine aura.Magnesium  also demonstrated the benefit in menstrually related migraine.  Magnesium  is part of the messenger system in the serotonin cascade and it is a good muscle relaxant.  It is also useful for constipation which can be a side effect of other medications used to treat migraine. Good sources include nuts, whole grains, and tomatoes. Side Effects: loose stool/diarrhea  Riboflavin (vitamin B 2) 200 mg twice a day. This vitamin assists nerve cells in the production of ATP a principal energy storing molecule.  It is necessary for many chemical reactions in the body.  There have been at least 3 clinical trials of riboflavin using 400 mg per day all of which suggested that migraine frequency can be decreased.  All 3 trials showed significant improvement in over half of migraine sufferers.  The supplement is found in bread, cereal, milk, meat, and poultry.  Most Americans get more riboflavin than the recommended daily allowance, however riboflavin deficiency is not necessary for  the supplements to help prevent headache. Side effects: energizing, green urine   Coenzyme Q10: This is present in almost all cells in the body and is critical component for the conversion of energy.  Recent studies have shown that a nutritional supplement of CoQ10 can reduce the frequency of migraine attacks by improving  the energy production of cells as with riboflavin.  Doses of 150 mg twice a day have been shown to be effective.   Melatonin: Increasing evidence shows correlation between melatonin secretion and headache conditions.  Melatonin supplementation has decreased headache intensity and duration.  It is widely used as a sleep aid.  Sleep is natures way of dealing with migraine.  A dose of 3 mg is recommended to start for headaches including cluster headache. Higher doses up to 15 mg has been reviewed for use in Cluster headache and have been used. The rationale behind using melatonin for cluster is that many theories regarding the cause of Cluster headache center around the disruption of the normal circadian rhythm in the brain.  This helps restore the normal circadian rhythm.   HEADACHE DIET: Foods and beverages which may trigger migraine Note that only 20% of headache patients are food sensitive. You will know if you are food sensitive if you get a headache consistently 20 minutes to 2 hours after eating a certain food. Only cut out a food if it causes headaches, otherwise you might remove foods you enjoy! What matters most for diet is to eat a well balanced healthy diet full of vegetables and low fat protein, and to not miss meals.   Chocolate, other sweets ALL cheeses except cottage and cream cheese Dairy products, yogurt, sour cream, ice cream Liver Meat extracts (Bovril, Marmite, meat tenderizers) Meats or fish which have undergone aging, fermenting, pickling or smoking. These include: Hotdogs,salami,Lox,sausage, mortadellas,smoked salmon, pepperoni, Pickled herring Pods of broad bean (English beans, Chinese pea pods, [place] (fava) beans, lima and navy beans Ripe avocado, ripe banana Yeast extracts or active yeast preparations such as Brewer's or Fleishman's (commercial bakes goods are permitted) Tomato based foods, pizza (lasagna, etc.)   MSG (monosodium glutamate) is disguised as many things;  look for these common aliases: Monopotassium glutamate Autolysed yeast Hydrolysed protein Sodium caseinate "flavorings" "all natural preservatives Nutrasweet   Avoid all other foods that convincingly provoke headaches.   Resources: The Dizzy Bluford Aid Your Headache Diet, migrainestrong.com  https://zamora-andrews.com/   Caffeine and Migraine For patients that have migraine, caffeine intake more than 3 days per week can lead to dependency and increased migraine frequency. I would recommend cutting back on your caffeine intake as best you can. The recommended amount of caffeine is 200-300 mg daily, although migraine patients may experience dependency at even lower doses. While you may notice an increase in headache temporarily, cutting back will be helpful for headaches in the long run. For more information on caffeine and migraine, visit: https://americanmigrainefoundation.org/resource-library/caffeine-and-migraine/   Headache Prevention Strategies:   1. Maintain a headache diary; learn to identify and avoid triggers.  - This can be a simple note where you log when you had a headache, associated symptoms, and medications used - There are several smartphone apps developed to help track migraines: Migraine Buddy, Migraine Monitor, Curelator N1-Headache App   Common triggers include: Emotional triggers: Emotional/Upset family or friends Emotional/Upset occupation Business reversal/success Anticipation anxiety Crisis-serious Post-crisis periodNew job/position   Physical triggers: Vacation Day Weekend Strenuous Exercise High Altitude Location New Move Menstrual Day Physical Illness Oversleep/Not enough sleep Weather changes  Light: Photophobia or light sesnitivity treatment involves a balance between desensitization and reduction in overly strong input. Use dark polarized glasses outside, but not inside. Avoid bright or fluorescent  light, but do not dim environment to the point that going into a normally lit room hurts. Consider FL-41 tint lenses, which reduce the most irritating wavelengths without blocking too much light.  These can be obtained at axonoptics.com or theraspecs.com Foods: see list above.   2. Limit use of acute treatments (over-the-counter medications, triptans, etc.) to no more than 2 days per week or 10 days per month to prevent medication overuse headache (rebound headache).     3. Follow a regular schedule (including weekends and holidays): Don't skip meals. Eat a balanced diet. 8 hours of sleep nightly. Minimize stress. Exercise 30 minutes per day. Being overweight is associated with a 5 times increased risk of chronic migraine. Keep well hydrated and drink 6-8 glasses of water per day.   4. Initiate non-pharmacologic measures at the earliest onset of your headache. Rest and quiet environment. Relax and reduce stress. Breathe2Relax is a free app that can instruct you on    some simple relaxtion and breathing techniques. Http://Dawnbuse.com is a    free website that provides teaching videos on relaxation.  Also, there are  many apps that   can be downloaded for "mindful" relaxation.  An app called YOGA NIDRA will help walk you through mindfulness. Another app called Calm can be downloaded to give you a structured mindfulness guide with daily reminders and skill development. Headspace for guided meditation Mindfulness Based Stress Reduction Online Course: www.palousemindfulness.com Cold compresses.   5. Don't wait!! Take the maximum allowable dosage of prescribed medication at the first sign of migraine.   6. Compliance:  Take prescribed medication regularly as directed and at the first sign of a migraine.   7. Communicate:  Call your physician when problems arise, especially if your headaches change, increase in frequency/severity, or become associated with neurological symptoms (weakness, numbness,  slurred speech, etc.). Proceed to emergency room if you experience new or worsening symptoms or symptoms do not resolve, if you have new neurologic symptoms or if headache is severe, or for any concerning symptom.   8. Headache/pain management therapies: Consider various complementary methods, including medication, behavioral therapy, psychological counselling, biofeedback, massage therapy, acupuncture, dry needling, and other modalities.  Such measures may reduce the need for medications. Counseling for pain management, where patients learn to function and ignore/minimize their pain, seems to work very well.   9. Recommend changing family's attention and focus away from patient's headaches. Instead, emphasize daily activities. If first question of day is 'How are your headaches/Do you have a headache today?', then patient will constantly think about headaches, thus making them worse. Goal is to re-direct attention away from headaches, toward daily activities and other distractions.   10. Helpful Websites: www.AmericanHeadacheSociety.org PatentHood.ch www.headaches.org TightMarket.nl www.achenet.org

## 2024-04-04 ENCOUNTER — Ambulatory Visit: Payer: Self-pay | Admitting: Student

## 2024-04-15 DIAGNOSIS — R42 Dizziness and giddiness: Secondary | ICD-10-CM

## 2024-05-15 ENCOUNTER — Ambulatory Visit: Admitting: Family Medicine

## 2024-05-27 ENCOUNTER — Ambulatory Visit (INDEPENDENT_AMBULATORY_CARE_PROVIDER_SITE_OTHER): Admitting: Audiology

## 2024-05-27 ENCOUNTER — Encounter (INDEPENDENT_AMBULATORY_CARE_PROVIDER_SITE_OTHER): Payer: Self-pay | Admitting: Otolaryngology

## 2024-05-27 ENCOUNTER — Ambulatory Visit (INDEPENDENT_AMBULATORY_CARE_PROVIDER_SITE_OTHER): Admitting: Otolaryngology

## 2024-05-27 VITALS — BP 134/82 | HR 70

## 2024-05-27 DIAGNOSIS — J342 Deviated nasal septum: Secondary | ICD-10-CM

## 2024-05-27 DIAGNOSIS — R42 Dizziness and giddiness: Secondary | ICD-10-CM

## 2024-05-27 DIAGNOSIS — Z0110 Encounter for examination of ears and hearing without abnormal findings: Secondary | ICD-10-CM

## 2024-05-27 DIAGNOSIS — R0981 Nasal congestion: Secondary | ICD-10-CM

## 2024-05-27 DIAGNOSIS — Z87891 Personal history of nicotine dependence: Secondary | ICD-10-CM

## 2024-05-27 DIAGNOSIS — H903 Sensorineural hearing loss, bilateral: Secondary | ICD-10-CM

## 2024-05-27 DIAGNOSIS — J343 Hypertrophy of nasal turbinates: Secondary | ICD-10-CM

## 2024-05-27 DIAGNOSIS — Z8501 Personal history of malignant neoplasm of esophagus: Secondary | ICD-10-CM

## 2024-05-27 DIAGNOSIS — J310 Chronic rhinitis: Secondary | ICD-10-CM | POA: Diagnosis not present

## 2024-05-27 NOTE — Progress Notes (Signed)
  [address], [address] [place], [place] [address] [phone]  Audiological Evaluation    Name: Rhonda Newton     DOB:   May 06, 1966      MRN:   [id number]                                                                                     Service Date: 05/27/2024     Accompanied by: unaccompanied   Patient comes today after Dr. Karis, ENT sent a referral for a hearing evaluation due to concerns with dizziness.   Symptoms Yes Details  Hearing loss  []    Tinnitus  [x]  Reports is it in both ears - longstanding  Ear pain/ infections/pressure  []    Balance problems  [x]  Says that about  three years ago had COVID 19 and since then whenever she is walking she feels like she is drunk. Rarely to never happens while driving or while she is sitting down. Says had a week during Easter 2025 when she felt fine for a week after taking some medications of upper respiratory infection.  Noise exposure history  []    Previous ear surgeries  []    Family history of hearing loss  []    Amplification  []    Other  []      Otoscopy: Right ear: Clear external ear canal and notable landmarks visualized on the tympanic membrane. Left ear:  Clear external ear canal and notable landmarks visualized on the tympanic membrane.  Tympanometry: Right ear: Type A- Normal external ear canal volume with normal middle ear pressure and tympanic membrane compliance. Left ear: Type A- Normal external ear canal volume with normal middle ear pressure and tympanic membrane compliance.    Pure tone Audiometry: Both ears- Normal hearing from  125 Hz - 8000 Hz.  Speech Audiometry: Right ear- Speech Reception Threshold (SRT) was obtained at 5 dBHL. Left ear-Speech Reception Threshold (SRT) was obtained at 5 dBHL.   Word Recognition Score Tested using NU-6 (recorded) Right ear: 100% was obtained at a presentation level of 50 dBHL with contralateral masking which is deemed as  excellent. Left ear: 100% was obtained  at a presentation level of 50 dBHL with contralateral masking which is deemed as  excellent.   The hearing test results were completed under headphones and re-checked with inserts and results are deemed to be of good reliability. Test technique:  conventional    Impression: Slight hearing difference at 8000 Hz, worse in the left ear.  Recommendations: Follow up with ENT as scheduled for today. Return for a hearing evaluation if concerns with hearing changes arise or per MD recommendation.   Rhonda Newton Rhonda Newton, AUD

## 2024-05-28 ENCOUNTER — Encounter (HOSPITAL_BASED_OUTPATIENT_CLINIC_OR_DEPARTMENT_OTHER): Payer: Self-pay | Admitting: Family Medicine

## 2024-05-28 DIAGNOSIS — R5383 Other fatigue: Secondary | ICD-10-CM

## 2024-05-28 DIAGNOSIS — H903 Sensorineural hearing loss, bilateral: Secondary | ICD-10-CM | POA: Insufficient documentation

## 2024-05-28 DIAGNOSIS — R42 Dizziness and giddiness: Secondary | ICD-10-CM

## 2024-05-28 DIAGNOSIS — H539 Unspecified visual disturbance: Secondary | ICD-10-CM

## 2024-05-28 DIAGNOSIS — J310 Chronic rhinitis: Secondary | ICD-10-CM | POA: Insufficient documentation

## 2024-05-28 DIAGNOSIS — J342 Deviated nasal septum: Secondary | ICD-10-CM | POA: Insufficient documentation

## 2024-05-28 DIAGNOSIS — J343 Hypertrophy of nasal turbinates: Secondary | ICD-10-CM | POA: Insufficient documentation

## 2024-05-28 NOTE — Progress Notes (Signed)
 CC: Chronic dizziness  HPI:  Rhonda Newton is a 58 y.o. female who presents today complaining of chronic dizziness.  According to the patient, her dizziness started in 2022, after her COVID infection.  She describes her dizziness as a sensation of being drunk.  She denies any spinning vertigo.  She also complains of frequent fatigue.  The patient has a history of esophageal cancer.  She was treated with chemoradiation and surgical excision in 2019.  As a result of the surgery, her vagus nerve was cut.  She was evaluated by cardiology and neurology.  She was treated with vestibular rehabilitation.  Her head and neck CT angiography and her brain MRI were all negative.  In April of this year, she had an episode of sinusitis.  She was treated with Augmentin , Claritin-D, and a OTC allergy medications.  While she was on the medications, her dizziness temporarily resolved.  Currently she denies any otalgia, otorrhea, facial pain, fever, or visual change.  She has no previous ENT surgery.  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   Hypertriglyceridemia 12/28/2021   Thyroid  nodule incidentally noted on imaging study 10/27/2022    Past Surgical History:  Procedure Laterality Date   cataracts Bilateral    age 54   ESOPHAGECTOMY  2019   at [facility]   INTRAUTERINE DEVICE INSERTION     [employer]   IUD REMOVAL      Family History  Problem Relation Age of Onset   Barrett's esophagus Mother    Hypertension Mother    Breast cancer Mother 65   Heart disease Father    Breast cancer Sister 66   Heart disease Maternal Grandmother    Heart disease Maternal Grandfather    Congestive Heart Failure Paternal Grandmother    Dementia Paternal  Grandmother    Kidney cancer Paternal Grandfather    Breast cancer Maternal Aunt 26   Barrett's esophagus Maternal Aunt    Prostate cancer Maternal Uncle        dx in his mid 83s   Breast cancer Paternal Aunt        dx in her 46s   COPD Paternal Uncle    Melanoma Cousin        mat first cousin; dx 4s-40s   Prostate cancer Cousin        mat first cousin dx in his mid 71s   Breast cancer Other        PGMs sister   Colon cancer Other        PGMs sister   Pancreatic cancer Other        MGFs sister   Esophageal cancer Other        mggm most likely this type of cancer    Social History:  reports that she quit smoking about 6 years ago. Her smoking use included cigarettes. She has a 15 pack-year smoking history. She has never used smokeless tobacco. She reports that she does not currently use drugs after having used the following drugs: Marijuana. She reports that she does not drink alcohol.  Allergies:  Allergies  Allergen Reactions   Benzodiazepines     Clouding of cognition and disinhibition  Prior to Admission medications   Medication Sig Start Date End Date Taking? Authorizing Provider  docusate sodium (COLACE) 100 MG capsule Take 100 mg by mouth daily.   Yes [provider]  SUMAtriptan  (IMITREX ) 25 MG tablet TAKE 1 TABLET (25 MG TOTAL) BY MOUTH 3 TIMES/DAY AS NEEDED-BETWEEN MEALS & BEDTIME. 06/29/23  Yes de [place], Raymond J, MD  Magnesium  500 MG TABS  11/07/21   [provider]    Blood pressure 134/82, pulse 70, last menstrual period 02/02/2012, SpO2 96%. Exam: General: Communicates without difficulty, well nourished, no acute distress. Head: Normocephalic, no evidence injury, no tenderness, facial buttresses intact without stepoff. Face/sinus: No tenderness to palpation and percussion. Facial movement is normal and symmetric. Eyes: PERRL, EOMI. No scleral icterus, conjunctivae clear. Neuro: CN II exam reveals vision grossly intact.  No nystagmus at any  point of gaze. Ears: Auricles well formed without lesions.  Ear canals are intact without mass or lesion.  No erythema or edema is appreciated.  The TMs are intact without fluid. Nose: External evaluation reveals normal support and skin without lesions.  Dorsum is intact.  Anterior rhinoscopy reveals congested mucosa over anterior aspect of inferior turbinates and intact septum.  No purulence noted. Oral:  Oral cavity and oropharynx are intact, symmetric, without erythema or edema.  Mucosa is moist without lesions. Neck: Full range of motion without pain.  There is no significant lymphadenopathy.  No masses palpable.  Thyroid  bed within normal limits to palpation.  Parotid glands and submandibular glands equal bilaterally without mass.  Trachea is midline. Neuro:  CN 2-12 grossly intact. Vestibular: No nystagmus at any point of gaze. Vestibular: There is no nystagmus with pneumatic pressure on either tympanic membrane or Valsalva. The cerebellar examination is unremarkable.    Procedure:  Flexible Nasal Endoscopy: Description: Risks, benefits, and alternatives of flexible endoscopy were explained to the patient.  Specific mention was made of the risk of throat numbness with difficulty swallowing, possible bleeding from the nose and mouth, and pain from the procedure.  The patient gave oral consent to proceed.  The flexible scope was inserted into the right nasal cavity.  Endoscopy of the interior nasal cavity, superior, inferior, and middle meatus was performed. The sphenoid-ethmoid recess was examined. Edematous mucosa was noted.  No polyp, mass, or lesion was appreciated. Nasal septal deviation noted. Olfactory cleft was clear.  Nasopharynx was clear.  Turbinates were hypertrophied but without mass.  The procedure was repeated on the contralateral side with similar findings.  The patient tolerated the procedure well.   Her hearing test shows minimal high-frequency sensorineural hearing loss.  Assessment: 1.   Chronic rhinitis with nasal mucosal congestion, nasal septal deviation, and bilateral inferior turbinate hypertrophy.  No recurrent sinusitis is noted today. 2.  Chronic dizziness, likely multifactorial, possibly a result of dysautonomia or sequela of her long COVID syndrome.  Other possible differential diagnoses include transient BPPV, vestibular migraine, Meniere's disease, peripheral vestibular dysfunction, or other central/systemic causes.   3.  Her ear canals, tympanic membranes, and middle ear spaces are all normal.  Plan: 1.  The physical exam and nasal endoscopy findings are reviewed with the patient. 2.  The patient is reassured that no recurrent or chronic sinusitis is noted today. 3.  The pathophysiology of vestibular dysfunction and dizziness are discussed extensively with the patient. The possible differential diagnoses are reviewed. Questions are invited and answered.   4.  The patient may benefit from seeing a rheumatologist regarding her long COVID  syndrome.  Her symptoms may be autoimmune in etiology. 5.  Continue with balance exercises at home. 6.  The patient is encouraged to call with any questions or concerns.  Addelynn Batte W Calil Amor 05/28/2024, 11:54 AM

## 2024-06-19 ENCOUNTER — Other Ambulatory Visit: Payer: Self-pay | Admitting: General Surgery

## 2024-06-19 DIAGNOSIS — Z1239 Encounter for other screening for malignant neoplasm of breast: Secondary | ICD-10-CM

## 2024-06-27 ENCOUNTER — Ambulatory Visit: Attending: Cardiology | Admitting: Internal Medicine

## 2024-06-27 ENCOUNTER — Encounter: Payer: Self-pay | Admitting: Internal Medicine

## 2024-06-27 VITALS — BP 128/70 | HR 61 | Ht 68.0 in | Wt 183.0 lb

## 2024-06-27 DIAGNOSIS — R42 Dizziness and giddiness: Secondary | ICD-10-CM

## 2024-06-27 DIAGNOSIS — E785 Hyperlipidemia, unspecified: Secondary | ICD-10-CM | POA: Diagnosis not present

## 2024-06-27 NOTE — Patient Instructions (Signed)
 Medication Instructions:  Your physician recommends that you continue on your current medications as directed. Please refer to the Current Medication list given to you today.  *If you need a refill on your cardiac medications before your next appointment, please call your pharmacy*  Follow-Up: At [facility], you and your health needs are our priority.  As part of our continuing mission to provide you with exceptional heart care, our providers are all part of one team.  This team includes your primary Cardiologist (physician) and Advanced Practice Providers or APPs (Physician Assistants and Nurse Practitioners) who all work together to provide you with the care you need, when you need it.  Your next appointment:   To be determined  Provider:   Vina Gull, MD  We recommend signing up for the patient portal called MyChart.  Sign up information is provided on this After Visit Summary.  MyChart is used to connect with patients for Virtual Visits (Telemedicine).  Patients are able to view lab/test results, encounter notes, upcoming appointments, etc.  Non-urgent messages can be sent to your provider as well.    To learn more about what you can do with MyChart, go to [email or web address].

## 2024-06-27 NOTE — Progress Notes (Signed)
 Cardiology Office Note   Date:  06/27/2024  ID:  Rhonda Newton, DOB 15-Aug-1966, MRN [id number]  PCP:  de [place], Raymond J, MD  Cardiologist:  Lonni   Pt presents for follow up of dizziness   History of Present Illness: Rhonda Newton is a 58 y.o. female with a history of intermittent lightheadedness/ dizziness, hyperlipidemia, Barrett's esophagus, esophageal cancer (s/p surgery with vagal nerve disruption), thyroid  nodule, and anxiety/ depression   2022  Dizziness started  in 2022 after having COVID  Seen by neuro and ophthalmology    2023  Pt referred to cardiology for evaluation of  intermittent dizziness/headedness.  ifPt said dferent from previous episodes of  vertigo.  Orthostatics were negative in the office. Vagal nerve cutting in past felt to contrib to symptoms    Plan was for Echo if symptoms worsen. She has been seen by Neurology for this in the past. Head/ neck CTA in 07/2022 and brain MRI in 08/2022 were unremarkable.   Nov 2024  Pt experienced extreme fatigue with minimal activity (washing hair).   Dec 2024  GXT   Pt exercised 9 min to peak HR of 148   Peak BP 197/77     Early 2025  Sinus infection  Easter took sudafed   Symptoms of dizziness, visual changes improved    June 2025   Seen by JAYSON Door in clinic Feeling drunk sensation, dizzy at all times, no difference in position.   Orthostatic vital signs were negative  Complained of extreme fatigue exertion esp if required bending But with walking long distances did fine   SInce seen in June she continues to feel about the same   No syncope Still with dizziness   No syncope   No SOB    Current Meds  Medication Sig   docusate sodium (COLACE) 100 MG capsule Take 100 mg by mouth daily.   [DISCONTINUED] SUMAtriptan  (IMITREX ) 25 MG tablet TAKE 1 TABLET (25 MG TOTAL) BY MOUTH 3 TIMES/DAY AS NEEDED-BETWEEN MEALS & BEDTIME.     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 Her smoking use included cigarettes. She has a 15 pack-year smoking history. She has been exposed to tobacco smoke. She has never used smokeless tobacco. She reports that she does not currently use drugs after having used the following drugs: Marijuana. She reports that she does not drink alcohol.   Family History:  The patient's family history includes Barrett's esophagus in her maternal aunt and mother; Breast cancer in her paternal aunt and another family member; Breast cancer (age of onset: 4) in her sister; Breast cancer (age of onset: 1) in her mother; Breast cancer (age of onset: 83) in her  maternal aunt; COPD in her paternal uncle; Colon cancer in an other family member; Congestive Heart Failure in her paternal grandmother; Dementia in her paternal grandmother; Esophageal cancer in an other family member; Heart disease in her father, maternal grandfather, and maternal grandmother; Hypertension in her mother; Kidney cancer in her paternal grandfather; Melanoma in her cousin; Pancreatic cancer in an other family member; Prostate cancer in her cousin and maternal uncle.    ROS:  Please see the history of present illness. All other systems are reviewed and  Negative to the above problem except as noted.    PHYSICAL EXAM: VS:  BP 128/70 (BP Location: Left Arm, Patient  Position: Sitting)   Pulse 61   Ht 5' 8 (1.727 m)   Wt 183 lb (83 kg)   LMP 02/02/2012   SpO2 97%   BMI 27.83 kg/m   GEN: Pt is in no acute distress  HEENT: normal  Neck: no JVD, no carotid bruit Cardiac: RRR; no murmurs Respiratory:  CTA GI: soft, nontender,No hepatomegaly  Ext  No LE edema     EKG:  EKG is ordered today.  NSR 61 bpm   The following studies were reviewed:  Echo  July 2025  1. Left ventricular ejection fraction, by estimation, is 60 to 65%. The  left ventricle has normal function. The left ventricle has no regional  wall motion abnormalities. Left ventricular diastolic parameters were  normal.   2. Right ventricular systolic function is normal. The right ventricular  size is normal.   3. The mitral valve is abnormal. Mild mitral valve regurgitation. No  evidence of mitral stenosis.   4. The aortic valve is tricuspid. There is mild calcification of the  aortic valve. There is mild thickening of the aortic valve. Aortic valve  regurgitation is mild. Aortic valve sclerosis is present, with no evidence  of aortic valve stenosis.   5. The inferior vena cava is normal in size with greater than 50%  respiratory variability, suggesting right atrial pressure of 3 mmHg.   Monitor 48 hour  JUne 2025     Patch Wear Time:  2 days and 23 hours (2025-06-21T07:06:06-399 to 2025-06-24T06:15:13-0400)   Patient had a min HR of 48 bpm, max HR of 190 bpm, and avg HR of 72 bpm. Predominant underlying rhythm was Sinus Rhythm. 7 Supraventricular Tachycardia runs occurred, the run with the fastest interval lasting 4 beats with a max rate of 190 bpm, the longest lasting 15 beats with an avg rate of 113 bpm. No VT, SVT, atrial fibrillation, high degree block, or pauses noted. Isolated atrial and ventricular ectopy was rare (<1%). There were 21 triggered events, which were sinus rhythm. No high risk arrhythmias detected.     Echo   July  2024    1. Left ventricular ejection  fraction, by estimation, is 60 to 65%. The  left ventricle has normal function. The left ventricle has no regional  wall motion abnormalities. Left ventricular diastolic parameters were  normal.   2. Right ventricular systolic function is normal. The right ventricular  size is normal.   3. The mitral valve is abnormal. Mild mitral valve regurgitation. No  evidence of mitral stenosis.   4. The aortic valve is tricuspid. There is mild calcification of the  aortic valve. There is mild thickening of the aortic valve. Aortic valve  regurgitation is mild. Aortic valve sclerosis is present, with no evidence  of aortic valve stenosis.   5. The inferior vena cava is  normal in size with greater than 50%  respiratory variability, suggesting right atrial pressure of 3 mmHg.    Exercise tolerance Test 08/15/2023:   Good exercise capacity, achieved 10.1 METS   Peak heart rate 148 bpm (90% max age-predicted heart rate)   Hypertensive response to exercise, peak BP 197/77   No ST deviation was noted.   Low risk study Lipid Panel    Component Value Date/Time   CHOL 185 07/04/2024 0857   TRIG 147 07/04/2024 0857   HDL 50 07/04/2024 0857   CHOLHDL 3.7 07/04/2024 0857   LDLCALC 109 (H) 07/04/2024 0857      Wt Readings from Last 3 Encounters:  07/15/24 184 lb (83.5 kg)  06/27/24 183 lb (83 kg)  04/03/24 184 lb 6.4 oz (83.6 kg)      ASSESSMENT AND PLAN:  1  Dizziness   PT with dizziness all the time, worse with bending Has had evaluation by neuro, ophthalmology and now cardiology.   Interesting that symptoms got better on ABX then returned   Orthostatics negative on previous checks   She may have some positional intolerance but it does not seem to be orthostatic   REcomm:  I would stay active    Stay hydrated   At least 100 oz per day  Will review records but no further testing for now     Current medicines are reviewed at length with the patient today.  The patient does not have concerns  regarding medicines.  Signed, Vina Gull, MD

## 2024-07-04 ENCOUNTER — Other Ambulatory Visit (HOSPITAL_BASED_OUTPATIENT_CLINIC_OR_DEPARTMENT_OTHER): Payer: Self-pay | Admitting: *Deleted

## 2024-07-04 DIAGNOSIS — Z0000 Encounter for general adult medical examination without abnormal findings: Secondary | ICD-10-CM

## 2024-07-05 ENCOUNTER — Inpatient Hospital Stay: Admission: RE | Admit: 2024-07-05 | Source: Ambulatory Visit

## 2024-07-05 LAB — TSH RFX ON ABNORMAL TO FREE T4: TSH: 0.655 u[IU]/mL (ref 0.450–4.500)

## 2024-07-05 LAB — CBC WITH DIFFERENTIAL/PLATELET
Basophils Absolute: 0.1 x10E3/uL (ref 0.0–0.2)
Basos: 1 %
EOS (ABSOLUTE): 0.1 x10E3/uL (ref 0.0–0.4)
Eos: 2 %
Hematocrit: 41.5 % (ref 34.0–46.6)
Hemoglobin: 13.2 g/dL (ref 11.1–15.9)
Immature Grans (Abs): 0 x10E3/uL (ref 0.0–0.1)
Immature Granulocytes: 0 %
Lymphocytes Absolute: 1.6 x10E3/uL (ref 0.7–3.1)
Lymphs: 28 %
MCH: 28.5 pg (ref 26.6–33.0)
MCHC: 31.8 g/dL (ref 31.5–35.7)
MCV: 90 fL (ref 79–97)
Monocytes Absolute: 0.7 x10E3/uL (ref 0.1–0.9)
Monocytes: 12 %
Neutrophils Absolute: 3.1 x10E3/uL (ref 1.4–7.0)
Neutrophils: 57 %
Platelets: 306 x10E3/uL (ref 150–450)
RBC: 4.63 x10E6/uL (ref 3.77–5.28)
RDW: 13 % (ref 11.7–15.4)
WBC: 5.5 x10E3/uL (ref 3.4–10.8)

## 2024-07-05 LAB — LIPID PANEL
Chol/HDL Ratio: 3.7 ratio (ref 0.0–4.4)
Cholesterol, Total: 185 mg/dL (ref 100–199)
HDL: 50 mg/dL (ref >39)
LDL Chol Calc (NIH): 109 mg/dL — ABNORMAL HIGH (ref 0–99)
Triglycerides: 147 mg/dL (ref 0–149)
VLDL Cholesterol Cal: 26 mg/dL (ref 5–40)

## 2024-07-05 LAB — COMPREHENSIVE METABOLIC PANEL WITH GFR
ALT: 14 IU/L (ref 0–32)
AST: 17 IU/L (ref 0–40)
Albumin: 4.4 g/dL (ref 3.8–4.9)
Alkaline Phosphatase: 126 IU/L (ref 49–135)
BUN/Creatinine Ratio: 9 (ref 9–23)
BUN: 9 mg/dL (ref 6–24)
Bilirubin Total: 0.8 mg/dL (ref 0.0–1.2)
CO2: 23 mmol/L (ref 20–29)
Calcium: 9.3 mg/dL (ref 8.7–10.2)
Chloride: 102 mmol/L (ref 96–106)
Creatinine, Ser: 0.95 mg/dL (ref 0.57–1.00)
Globulin, Total: 2.8 g/dL (ref 1.5–4.5)
Glucose: 92 mg/dL (ref 70–99)
Potassium: 4 mmol/L (ref 3.5–5.2)
Sodium: 140 mmol/L (ref 134–144)
Total Protein: 7.2 g/dL (ref 6.0–8.5)
eGFR: 70 mL/min/1.73 (ref >59)

## 2024-07-05 LAB — HEMOGLOBIN A1C
Est. average glucose Bld gHb Est-mCnc: 114 mg/dL
Hgb A1c MFr Bld: 5.6 % (ref 4.8–5.6)

## 2024-07-12 ENCOUNTER — Ambulatory Visit (HOSPITAL_BASED_OUTPATIENT_CLINIC_OR_DEPARTMENT_OTHER): Payer: Self-pay | Admitting: Family Medicine

## 2024-07-15 ENCOUNTER — Ambulatory Visit (INDEPENDENT_AMBULATORY_CARE_PROVIDER_SITE_OTHER): Admitting: Family Medicine

## 2024-07-15 ENCOUNTER — Encounter (HOSPITAL_BASED_OUTPATIENT_CLINIC_OR_DEPARTMENT_OTHER): Payer: Self-pay | Admitting: Family Medicine

## 2024-07-15 VITALS — BP 123/81 | HR 62 | Temp 97.8°F | Resp 18 | Ht 68.0 in | Wt 184.0 lb

## 2024-07-15 DIAGNOSIS — Z0000 Encounter for general adult medical examination without abnormal findings: Secondary | ICD-10-CM

## 2024-07-15 DIAGNOSIS — Z23 Encounter for immunization: Secondary | ICD-10-CM | POA: Diagnosis not present

## 2024-07-15 MED ORDER — SUMATRIPTAN SUCCINATE 25 MG PO TABS: 25.0000 mg | ORAL_TABLET | Freq: Two times a day (BID) | ORAL | 11 refills | Status: AC | PRN

## 2024-07-15 NOTE — Progress Notes (Unsigned)
 Subjective:    CC: Annual Physical Exam  HPI: Rhonda Newton is a 58 y.o. presenting for annual physical  I reviewed the past medical history, family history, social history, surgical history, and allergies today and no changes were needed.  Please see the problem list section below in epic for further details.  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   Hypertriglyceridemia 12/28/2021   Thyroid  nodule incidentally noted on imaging study 10/27/2022   Past Surgical History: Past Surgical History:  Procedure Laterality Date   cataracts Bilateral    age 50   ESOPHAGECTOMY  2019   at [facility]   INTRAUTERINE DEVICE INSERTION     [employer]   IUD REMOVAL     Social History: Social History   Socioeconomic History   Marital status: Divorced    Spouse name: Not on file   Number of children: 1   Years of education: Not on file   Highest education level: Bachelor's degree (e.g., BA, AB, BS)  Occupational History   Occupation: print production planner  Tobacco Use   Smoking status: Former    Current packs/day: 0.00    Average packs/day: 1 pack/day for 15.0 years (15.0 ttl pk-yrs)    Types: Cigarettes    Quit date: 05/14/2018    Years since quitting: 6.1    Passive exposure: Past   Smokeless tobacco: Never  Vaping Use   Vaping status: Never Used  Substance and Sexual Activity   Alcohol use: No   Drug use: Not Currently    Types: Marijuana   Sexual activity: Not Currently    Birth control/protection: None  Other Topics Concern   Not on file  Social History Narrative   Not on file   Social Drivers of Health   Financial Resource Strain: Low Risk  (07/11/2024)   Overall Financial Resource Strain  (CARDIA)    Difficulty of Paying Living Expenses: Not hard at all  Food Insecurity: No Food Insecurity (07/11/2024)   Hunger Vital Sign    Worried About Running Out of Food in the Last Year: Never true    Ran Out of Food in the Last Year: Never true  Transportation Needs: No Transportation Needs (07/11/2024)   PRAPARE - Administrator, Civil Service (Medical): No    Lack of Transportation (Non-Medical): No  Physical Activity: Insufficiently Active (07/11/2024)   Exercise Vital Sign    Days of Exercise per Week: 2 days    Minutes of Exercise per Session: 20 min  Stress: No Stress Concern Present (07/11/2024)   [employer] of Occupational Health - Occupational Stress Questionnaire    Feeling of Stress: Not at all  Social Connections: Unknown (07/11/2024)   Social Connection and Isolation Panel    Frequency of Communication with Friends and Family: Patient declined    Frequency of Social Gatherings with Friends and Family: Patient declined    Attends Religious Services: Patient declined    Database Administrator or Organizations: Patient declined    Attends Engineer, Structural: Not on file    Marital Status: Patient declined   Family History: Family History  Problem Relation Age of Onset  Barrett's esophagus Mother    Hypertension Mother    Breast cancer Mother 59   Heart disease Father    Breast cancer Sister 17   Heart disease Maternal Grandmother    Heart disease Maternal Grandfather    Congestive Heart Failure Paternal Grandmother    Dementia Paternal Grandmother    Kidney cancer Paternal Grandfather    Breast cancer Maternal Aunt 40   Barrett's esophagus Maternal Aunt    Prostate cancer Maternal Uncle        dx in his mid 73s   Breast cancer Paternal Aunt        dx in her 13s   COPD Paternal Uncle    Melanoma Cousin        mat first cousin; dx 51s-40s   Prostate cancer Cousin        mat first cousin dx in his mid 90s   Breast cancer  Other        PGMs sister   Colon cancer Other        PGMs sister   Pancreatic cancer Other        MGFs sister   Esophageal cancer Other        mggm most likely this type of cancer   Allergies: Allergies  Allergen Reactions   Benzodiazepines     Clouding of cognition and disinhibition   Medications: See med rec.  Review of Systems: No headache, visual changes, nausea, vomiting, diarrhea, constipation, dizziness, abdominal pain, skin rash, fevers, chills, night sweats, swollen lymph nodes, weight loss, chest pain, body aches, joint swelling, muscle aches, shortness of breath, mood changes, visual or auditory hallucinations.  Objective:    BP 123/81 (BP Location: Left Arm, Patient Position: Sitting, Cuff Size: Normal)   Pulse 62   Temp 97.8 F (36.6 C) (Oral)   Resp 18   Ht 5' 8 (1.727 m)   Wt 184 lb (83.5 kg)   LMP 02/02/2012   SpO2 99%   BMI 27.98 kg/m   General: Well Developed, well nourished, and in no acute distress.  Neuro: Alert and oriented x3, extra-ocular muscles intact, sensation grossly intact. Cranial nerves II through XII are intact, motor, sensory, and coordinative functions are all intact. HEENT: Normocephalic, atraumatic, pupils equal round reactive to light, neck supple, no masses, no lymphadenopathy, thyroid  nonpalpable. Oropharynx, nasopharynx, external ear canals are unremarkable. Skin: Warm and dry, no rashes noted.  Cardiac: Regular rate and rhythm, no murmurs rubs or gallops.  Respiratory: Clear to auscultation bilaterally. Not using accessory muscles, speaking in full sentences.  Abdominal: Soft, nontender, nondistended, positive bowel sounds, no masses, no organomegaly.  Musculoskeletal: Shoulder, elbow, wrist, hip, knee, ankle stable, and with full range of motion.  Impression and Recommendations:    Wellness examination Assessment & Plan: Routine HCM labs reviewed. HCM reviewed/discussed. Anticipatory guidance regarding healthy weight, lifestyle  and choices given. Recommend healthy diet.  Recommend approximately 150 minutes/week of moderate intensity exercise Recommend regular dental and vision exams Always use seatbelt/lap and shoulder restraints Recommend using smoke alarms and checking batteries at least twice a year Recommend using sunscreen when outside Discussed colon cancer screening recommendations, options.  Patient is UTD Discussed recommendations for shingles vaccine.  Patient is UTD Discussed immunization recommendations   Other orders -     SUMAtriptan  Succinate; Take 1 tablet (25 mg total) by mouth 3 times/day as needed-between meals & bedtime.  Dispense: 12 tablet; Refill: 11  Return in about 1 year (around 07/15/2025) for CPE.  ___________________________________________ Devonte Migues de [place], MD, ABFM, CAQSM [facility]

## 2024-07-15 NOTE — Assessment & Plan Note (Signed)
 Routine HCM labs reviewed. HCM reviewed/discussed. Anticipatory guidance regarding healthy weight, lifestyle and choices given. Recommend healthy diet.  Recommend approximately 150 minutes/week of moderate intensity exercise Recommend regular dental and vision exams Always use seatbelt/lap and shoulder restraints Recommend using smoke alarms and checking batteries at least twice a year Recommend using sunscreen when outside Discussed colon cancer screening recommendations, options.  Patient is UTD Discussed recommendations for shingles vaccine.  Patient is UTD Discussed immunization recommendations

## 2024-07-17 DIAGNOSIS — R928 Other abnormal and inconclusive findings on diagnostic imaging of breast: Secondary | ICD-10-CM | POA: Diagnosis not present

## 2024-07-18 DIAGNOSIS — Z01419 Encounter for gynecological examination (general) (routine) without abnormal findings: Secondary | ICD-10-CM | POA: Diagnosis not present

## 2024-07-18 DIAGNOSIS — Z1331 Encounter for screening for depression: Secondary | ICD-10-CM | POA: Diagnosis not present

## 2024-07-31 DIAGNOSIS — C155 Malignant neoplasm of lower third of esophagus: Secondary | ICD-10-CM | POA: Diagnosis not present

## 2024-07-31 DIAGNOSIS — Z87891 Personal history of nicotine dependence: Secondary | ICD-10-CM | POA: Diagnosis not present

## 2024-09-25 ENCOUNTER — Encounter (HOSPITAL_BASED_OUTPATIENT_CLINIC_OR_DEPARTMENT_OTHER): Payer: Self-pay

## 2024-09-25 ENCOUNTER — Ambulatory Visit (HOSPITAL_BASED_OUTPATIENT_CLINIC_OR_DEPARTMENT_OTHER)

## 2024-09-25 VITALS — BP 123/84 | HR 69 | Ht 68.0 in | Wt 183.7 lb

## 2024-09-25 DIAGNOSIS — R0989 Other specified symptoms and signs involving the circulatory and respiratory systems: Secondary | ICD-10-CM

## 2024-09-25 DIAGNOSIS — R051 Acute cough: Secondary | ICD-10-CM

## 2024-09-25 LAB — POCT INFLUENZA A/B
Influenza A, POC: NEGATIVE
Influenza B, POC: NEGATIVE

## 2024-09-25 MED ORDER — BENZONATATE 100 MG PO CAPS: 100.0000 mg | ORAL_CAPSULE | Freq: Two times a day (BID) | ORAL | 1 refills | Status: AC | PRN

## 2024-09-25 MED ORDER — ALBUTEROL SULFATE HFA 108 (90 BASE) MCG/ACT IN AERS: 2.0000 | INHALATION_SPRAY | Freq: Four times a day (QID) | RESPIRATORY_TRACT | 1 refills | Status: AC | PRN

## 2024-09-25 NOTE — Progress Notes (Signed)
 "     Acute Care Office Visit  Subjective:   Rhonda Newton 12/09/1965 09/25/2024  Chief Complaint  Patient presents with   Cough    Patient states she's been having chills, cough, runny nose, and chest congestion. Symptoms have been present for about 4 days.     HPI: Pt endorses cough, congestion, and runny nose over the past 4 days. Pt states everyone at her job has been sick with a virus. Denies fever. Has been taking 1 sudafed daily to help with symptoms. Took a home covid test which was negative.    Otc mucinex or alka seltzer   The following portions of the patient's history were reviewed and updated as appropriate: past medical history, past surgical history, family history, social history, allergies, medications, and problem list.   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   Hypertriglyceridemia 12/28/2021   Visual disturbance 09/22/2021   BPPV (benign paroxysmal positional vertigo) 08/30/2021   Migraine 06/29/2021   Wellness examination 06/29/2021   Elevated serum creatinine 06/29/2021   Barrett's esophagus with dysplasia 08/16/2018   Genetic testing 07/06/2018   Family history of breast cancer    Family history of colon cancer    Family history of prostate cancer    Family history of melanoma    Malignant neoplasm of lower third of esophagus (HCC) 05/16/2018   Benzodiazepine causing adverse effect in therapeutic use, sequela 02/13/2012   Bipolar affective disorder, mixed, moderate degree (HCC) 02/10/2012    Class: Acute   Panic disorder with agoraphobia, moderate agoraphobic avoidance and moderate panic  attacks 02/10/2012   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   Hypertriglyceridemia 12/28/2021   Thyroid  nodule incidentally noted on imaging study 10/27/2022   Past Surgical History:  Procedure Laterality Date   cataracts Bilateral    age 59   ESOPHAGECTOMY  2019   at [facility]   INTRAUTERINE DEVICE INSERTION     [employer]   IUD REMOVAL     Family History  Problem Relation Age of Onset   Barrett's esophagus Mother    Hypertension Mother    Breast cancer Mother 66   Heart disease Father    Breast cancer Sister 68   Heart disease Maternal Grandmother    Heart disease Maternal Grandfather    Congestive Heart Failure Paternal Grandmother    Dementia Paternal Grandmother    Kidney cancer Paternal Grandfather    Breast cancer Maternal Aunt 52   Barrett's esophagus Maternal Aunt    Prostate cancer Maternal Uncle        dx in his mid 69s   Breast cancer Paternal Aunt        dx in her 56s   COPD Paternal Uncle    Melanoma Cousin        mat first cousin; dx 77s-40s   Prostate cancer  Cousin        mat first cousin dx in his mid 65s   Breast cancer Other        PGMs sister   Colon cancer Other        PGMs sister   Pancreatic cancer Other        MGFs sister   Esophageal cancer Other        mggm most likely this type of cancer   Outpatient Medications Prior to Visit  Medication Sig Dispense Refill   docusate sodium (COLACE) 100 MG capsule Take 100 mg by mouth daily.     SUMAtriptan  (IMITREX ) 25 MG tablet Take 1 tablet (25 mg total) by mouth 3 times/day as needed-between meals & bedtime. 12 tablet 11   Magnesium  500 MG TABS  (Patient not taking: Reported on 09/25/2024)     No facility-administered medications  prior to visit.   Allergies[1]   ROS: A complete ROS was performed with pertinent positives/negatives noted in the HPI. The remainder of the ROS are negative.    Objective:   Today's Vitals   09/25/24 1503  BP: 123/84  Pulse: 69  SpO2: 98%  Weight: 183 lb 11.2 oz (83.3 kg)  Height: 5' 8 (1.727 m)    GENERAL: Well-appearing, in NAD. Well nourished.  NOSE: Septum midline w/o deformity. Nares patent, mucosa pink and non-inflamed w clear drainage. No sinus tenderness.  THROAT: Uvula midline. Oropharynx clear. Tonsils non-inflamed without exudate. Mucous membranes pink and moist.  RESPIRATORY: Chest wall symmetrical. Respirations even and non-labored. Breath sounds clear to upper lobes. Bilateral lower lobes with mild wheezing. CARDIAC: S1, S2 present, regular rate and rhythm without murmur or gallops. Peripheral pulses 2+ bilaterally.  MSK: Muscle tone and strength appropriate for age. Joints w/o tenderness, redness, or swelling.  EXTREMITIES: Without clubbing, cyanosis, or edema.  NEUROLOGIC: No motor or sensory deficits. Steady, even gait. C2-C12 intact.  PSYCH/MENTAL STATUS: Alert, oriented x 3. Cooperative, appropriate mood and affect.    Results for orders placed or performed in visit on 09/25/24  POCT Influenza A/B  Result Value Ref Range   Influenza A, POC Negative Negative   Influenza B, POC Negative Negative      Assessment & Plan:  1. Chest congestion (Primary) Discussed symptoms likely related to viral illness. Since patient had noted wheezing on exam, I have prescribed her albuterol  PRN for chest congestion. She denies chest pain or shob. Declined chest xray at today's visit. Since patient will be taking albuterol  I did discuss using an OTC mucinex or alka seltzer to aid in treatment of congestion as using sudafed would be warranted due to increased jitteriness. I did give patient close follow up instructions to let PCP know if symptoms are worse or unresolved in  about 1 week. Pt verbalized understanding. Discussed red flag symptoms and when to be seen in ER or by PCP - POCT Influenza A/B - albuterol  (VENTOLIN  HFA) 108 (90 Base) MCG/ACT inhaler; Inhale 2 puffs into the lungs every 6 (six) hours as needed for wheezing or shortness of breath.  Dispense: 8 g; Refill: 1  2. Acute cough Ordered to help aid in constant cough as well as to help at night for patient to sleep so coughing does not keep her awake. - benzonatate  (TESSALON ) 100 MG capsule; Take 1 capsule (100 mg total) by mouth 2 (two) times daily as needed for cough.  Dispense: 20 capsule; Refill: 1   Meds ordered this encounter  Medications   benzonatate  (  TESSALON ) 100 MG capsule    Sig: Take 1 capsule (100 mg total) by mouth 2 (two) times daily as needed for cough.    Dispense:  20 capsule    Refill:  1   albuterol  (VENTOLIN  HFA) 108 (90 Base) MCG/ACT inhaler    Sig: Inhale 2 puffs into the lungs every 6 (six) hours as needed for wheezing or shortness of breath.    Dispense:  8 g    Refill:  1   Lab Orders         POCT Influenza A/B     No images are attached to the encounter or orders placed in the encounter.  Return if symptoms worsen or fail to improve.    Patient to reach out to office if new, worrisome, or unresolved symptoms arise or if no improvement in patient's condition. Patient verbalized understanding and is agreeable to treatment plan. All questions answered to patient's satisfaction.    Lauraine Almarie Angus DNP, FNP-C       [1]  Allergies Allergen Reactions   Benzodiazepines     Clouding of cognition and disinhibition   "
